# Patient Record
Sex: Male | Born: 1950 | Race: Black or African American | Hispanic: No | Smoking: Former smoker
Health system: Southern US, Community
[De-identification: ages and names within clinical notes are randomized; demographics above are authoritative.]

## PROBLEM LIST (undated history)

## (undated) DIAGNOSIS — I739 Peripheral vascular disease, unspecified: Secondary | ICD-10-CM

## (undated) DIAGNOSIS — F0390 Unspecified dementia without behavioral disturbance: Secondary | ICD-10-CM

## (undated) DIAGNOSIS — I8290 Acute embolism and thrombosis of unspecified vein: Secondary | ICD-10-CM

## (undated) DIAGNOSIS — K922 Gastrointestinal hemorrhage, unspecified: Secondary | ICD-10-CM

## (undated) DIAGNOSIS — I10 Essential (primary) hypertension: Secondary | ICD-10-CM

## (undated) HISTORY — PX: OTHER SURGICAL HISTORY: SHX169

---

## 2018-02-06 DIAGNOSIS — I8290 Acute embolism and thrombosis of unspecified vein: Secondary | ICD-10-CM

## 2018-02-06 HISTORY — DX: Acute embolism and thrombosis of unspecified vein: I82.90

## 2018-02-17 ENCOUNTER — Emergency Department (HOSPITAL_BASED_OUTPATIENT_CLINIC_OR_DEPARTMENT_OTHER): Payer: Medicare Other

## 2018-02-17 ENCOUNTER — Other Ambulatory Visit: Payer: Self-pay

## 2018-02-17 ENCOUNTER — Encounter (HOSPITAL_BASED_OUTPATIENT_CLINIC_OR_DEPARTMENT_OTHER): Payer: Self-pay | Admitting: *Deleted

## 2018-02-17 ENCOUNTER — Inpatient Hospital Stay (HOSPITAL_BASED_OUTPATIENT_CLINIC_OR_DEPARTMENT_OTHER)
Admission: EM | Admit: 2018-02-17 | Discharge: 2018-02-22 | DRG: 253 | Disposition: A | Discharge status: Home / Self Care | Payer: Medicare Other | Attending: Family Medicine | Admitting: Family Medicine

## 2018-02-17 DIAGNOSIS — R945 Abnormal results of liver function studies: Secondary | ICD-10-CM

## 2018-02-17 DIAGNOSIS — E872 Acidosis: Secondary | ICD-10-CM | POA: Diagnosis present

## 2018-02-17 DIAGNOSIS — I70244 Atherosclerosis of native arteries of left leg with ulceration of heel and midfoot: Principal | ICD-10-CM | POA: Diagnosis present

## 2018-02-17 DIAGNOSIS — E860 Dehydration: Secondary | ICD-10-CM | POA: Diagnosis present

## 2018-02-17 DIAGNOSIS — I70229 Atherosclerosis of native arteries of extremities with rest pain, unspecified extremity: Secondary | ICD-10-CM | POA: Diagnosis present

## 2018-02-17 DIAGNOSIS — Z7901 Long term (current) use of anticoagulants: Secondary | ICD-10-CM

## 2018-02-17 DIAGNOSIS — Z23 Encounter for immunization: Secondary | ICD-10-CM

## 2018-02-17 DIAGNOSIS — F17210 Nicotine dependence, cigarettes, uncomplicated: Secondary | ICD-10-CM | POA: Diagnosis present

## 2018-02-17 DIAGNOSIS — E876 Hypokalemia: Secondary | ICD-10-CM | POA: Diagnosis present

## 2018-02-17 DIAGNOSIS — M6282 Rhabdomyolysis: Secondary | ICD-10-CM | POA: Diagnosis present

## 2018-02-17 DIAGNOSIS — I10 Essential (primary) hypertension: Secondary | ICD-10-CM | POA: Diagnosis present

## 2018-02-17 DIAGNOSIS — F0390 Unspecified dementia without behavioral disturbance: Secondary | ICD-10-CM | POA: Diagnosis present

## 2018-02-17 DIAGNOSIS — I998 Other disorder of circulatory system: Secondary | ICD-10-CM | POA: Diagnosis present

## 2018-02-17 HISTORY — DX: Essential (primary) hypertension: I10

## 2018-02-17 HISTORY — DX: Unspecified dementia, unspecified severity, without behavioral disturbance, psychotic disturbance, mood disturbance, and anxiety: F03.90

## 2018-02-17 HISTORY — DX: Acute embolism and thrombosis of unspecified vein: I82.90

## 2018-02-17 LAB — CBC WITH DIFFERENTIAL/PLATELET
ABS IMMATURE GRANULOCYTES: 0.11 10*3/uL — AB (ref 0.00–0.07)
Basophils Absolute: 0.1 10*3/uL (ref 0.0–0.1)
Basophils Relative: 0 %
EOS PCT: 2 %
Eosinophils Absolute: 0.2 10*3/uL (ref 0.0–0.5)
HEMATOCRIT: 32.3 % — AB (ref 39.0–52.0)
HEMOGLOBIN: 10.6 g/dL — AB (ref 13.0–17.0)
Immature Granulocytes: 1 %
LYMPHS ABS: 1.7 10*3/uL (ref 0.7–4.0)
LYMPHS PCT: 14 %
MCH: 26.7 pg (ref 26.0–34.0)
MCHC: 32.8 g/dL (ref 30.0–36.0)
MCV: 81.4 fL (ref 80.0–100.0)
MONO ABS: 0.5 10*3/uL (ref 0.1–1.0)
Monocytes Relative: 4 %
NEUTROS ABS: 9.5 10*3/uL — AB (ref 1.7–7.7)
Neutrophils Relative %: 79 %
Platelets: 353 10*3/uL (ref 150–400)
RBC: 3.97 MIL/uL — ABNORMAL LOW (ref 4.22–5.81)
RDW: 15.8 % — ABNORMAL HIGH (ref 11.5–15.5)
WBC: 12.1 10*3/uL — ABNORMAL HIGH (ref 4.0–10.5)
nRBC: 0 % (ref 0.0–0.2)

## 2018-02-17 LAB — COMPREHENSIVE METABOLIC PANEL
ALK PHOS: 78 U/L (ref 38–126)
ALT: 52 U/L — AB (ref 0–44)
ANION GAP: 11 (ref 5–15)
AST: 66 U/L — ABNORMAL HIGH (ref 15–41)
Albumin: 2.9 g/dL — ABNORMAL LOW (ref 3.5–5.0)
BILIRUBIN TOTAL: 0.5 mg/dL (ref 0.3–1.2)
BUN: 28 mg/dL — ABNORMAL HIGH (ref 8–23)
CALCIUM: 8.6 mg/dL — AB (ref 8.9–10.3)
CO2: 27 mmol/L (ref 22–32)
CREATININE: 1.12 mg/dL (ref 0.61–1.24)
Chloride: 102 mmol/L (ref 98–111)
GFR calc Af Amer: >60 mL/min (ref >60)
GFR calc non Af Amer: >60 mL/min (ref >60)
Glucose, Bld: 128 mg/dL — ABNORMAL HIGH (ref 70–99)
Potassium: 2.7 mmol/L — CL (ref 3.5–5.1)
SODIUM: 140 mmol/L (ref 135–145)
TOTAL PROTEIN: 7.1 g/dL (ref 6.5–8.1)

## 2018-02-17 LAB — I-STAT CG4 LACTIC ACID, ED: LACTIC ACID, VENOUS: 2.77 mmol/L — AB (ref 0.5–1.9)

## 2018-02-17 LAB — URINALYSIS, ROUTINE W REFLEX MICROSCOPIC
Bilirubin Urine: NEGATIVE
GLUCOSE, UA: NEGATIVE mg/dL
HGB URINE DIPSTICK: NEGATIVE
Ketones, ur: NEGATIVE mg/dL
Leukocytes, UA: NEGATIVE
Nitrite: NEGATIVE
PH: 5.5 (ref 5.0–8.0)
Protein, ur: NEGATIVE mg/dL
SPECIFIC GRAVITY, URINE: 1.025 (ref 1.005–1.030)

## 2018-02-17 LAB — PROTIME-INR
INR: 1.33
Prothrombin Time: 16.3 seconds — ABNORMAL HIGH (ref 11.4–15.2)

## 2018-02-17 MED ORDER — POTASSIUM CHLORIDE CRYS ER 20 MEQ PO TBCR
20.00 | EXTENDED_RELEASE_TABLET | ORAL | Status: DC
Start: 2018-02-16 — End: 2018-02-17

## 2018-02-17 MED ORDER — AMLODIPINE BESYLATE 5 MG PO TABS
5.00 | ORAL_TABLET | ORAL | Status: DC
Start: 2018-02-16 — End: 2018-02-17

## 2018-02-17 MED ORDER — GENERIC EXTERNAL MEDICATION
2.00 | Status: DC
Start: 2018-02-15 — End: 2018-02-17

## 2018-02-17 MED ORDER — THERA-M PO TABS
1.00 | ORAL_TABLET | ORAL | Status: DC
Start: 2018-02-16 — End: 2018-02-17

## 2018-02-17 MED ORDER — ACETAMINOPHEN 325 MG PO TABS
650.00 | ORAL_TABLET | ORAL | Status: DC
Start: ? — End: 2018-02-17

## 2018-02-17 MED ORDER — SODIUM CHLORIDE 0.9 % IV SOLN: INTRAVENOUS | Status: DC

## 2018-02-17 MED ORDER — APIXABAN 5 MG PO TABS
5.00 | ORAL_TABLET | ORAL | Status: DC
Start: 2018-02-15 — End: 2018-02-17

## 2018-02-17 MED ORDER — MAGNESIUM OXIDE 400 MG PO TABS
400.00 | ORAL_TABLET | ORAL | Status: DC
Start: 2018-02-15 — End: 2018-02-17

## 2018-02-17 MED ORDER — METOPROLOL TARTRATE 25 MG PO TABS
25.00 | ORAL_TABLET | ORAL | Status: DC
Start: 2018-02-15 — End: 2018-02-17

## 2018-02-17 MED ORDER — SODIUM BICARBONATE 650 MG PO TABS
650.00 | ORAL_TABLET | ORAL | Status: DC
Start: 2018-02-15 — End: 2018-02-17

## 2018-02-17 MED ORDER — HYDROCODONE-ACETAMINOPHEN 5-325 MG PO TABS
1.00 | ORAL_TABLET | ORAL | Status: DC
Start: ? — End: 2018-02-17

## 2018-02-17 NOTE — ED Notes (Signed)
RN @bedside  and patient getting gowned, return shortly

## 2018-02-17 NOTE — ED Notes (Signed)
K level is 2.7. Dr. Deretha Emory aware

## 2018-02-17 NOTE — ED Notes (Signed)
ED Provider at bedside. 

## 2018-02-17 NOTE — ED Notes (Signed)
pts family member came to nurse first desk asking how many people were in front of them. RN explained to them that the number could change and patients being called back were based on acuity.

## 2018-02-17 NOTE — ED Triage Notes (Signed)
Pt. Was at [facility]. For Blood Clot in his groin per granddaughter and was blood thinners.  Pt. Also treated with antibiotics.  Pt. Here today with grand daughter because the wound on the R lower leg is not healing and she is upset that the Pt. Is now incont. Of urine.  Pt. Does live alone.  Pt. Reports he feels ok but reports he cant cont his urine.  Pt. Reports he eats some.  Has early dementia per family and is more confused per family.

## 2018-02-18 ENCOUNTER — Encounter (HOSPITAL_COMMUNITY): Payer: Self-pay | Admitting: Internal Medicine

## 2018-02-18 ENCOUNTER — Inpatient Hospital Stay (HOSPITAL_COMMUNITY): Payer: Medicare Other

## 2018-02-18 DIAGNOSIS — E872 Acidosis: Secondary | ICD-10-CM | POA: Diagnosis present

## 2018-02-18 DIAGNOSIS — I998 Other disorder of circulatory system: Secondary | ICD-10-CM | POA: Diagnosis not present

## 2018-02-18 DIAGNOSIS — Z23 Encounter for immunization: Secondary | ICD-10-CM | POA: Diagnosis not present

## 2018-02-18 DIAGNOSIS — E860 Dehydration: Secondary | ICD-10-CM | POA: Diagnosis present

## 2018-02-18 DIAGNOSIS — M6282 Rhabdomyolysis: Secondary | ICD-10-CM | POA: Diagnosis present

## 2018-02-18 DIAGNOSIS — E876 Hypokalemia: Secondary | ICD-10-CM

## 2018-02-18 DIAGNOSIS — I70229 Atherosclerosis of native arteries of extremities with rest pain, unspecified extremity: Secondary | ICD-10-CM | POA: Diagnosis present

## 2018-02-18 DIAGNOSIS — I739 Peripheral vascular disease, unspecified: Secondary | ICD-10-CM

## 2018-02-18 DIAGNOSIS — I70244 Atherosclerosis of native arteries of left leg with ulceration of heel and midfoot: Secondary | ICD-10-CM | POA: Diagnosis present

## 2018-02-18 DIAGNOSIS — F0390 Unspecified dementia without behavioral disturbance: Secondary | ICD-10-CM | POA: Diagnosis present

## 2018-02-18 DIAGNOSIS — R945 Abnormal results of liver function studies: Secondary | ICD-10-CM | POA: Diagnosis not present

## 2018-02-18 DIAGNOSIS — F17210 Nicotine dependence, cigarettes, uncomplicated: Secondary | ICD-10-CM | POA: Diagnosis present

## 2018-02-18 DIAGNOSIS — I10 Essential (primary) hypertension: Secondary | ICD-10-CM | POA: Diagnosis present

## 2018-02-18 DIAGNOSIS — Z01810 Encounter for preprocedural cardiovascular examination: Secondary | ICD-10-CM

## 2018-02-18 DIAGNOSIS — Z7901 Long term (current) use of anticoagulants: Secondary | ICD-10-CM | POA: Diagnosis not present

## 2018-02-18 LAB — APTT
APTT: 43 s — AB (ref 24–36)
APTT: 53 s — AB (ref 24–36)
APTT: 54 s — AB (ref 24–36)

## 2018-02-18 LAB — COMPREHENSIVE METABOLIC PANEL
ALT: 50 U/L — ABNORMAL HIGH (ref 0–44)
AST: 66 U/L — ABNORMAL HIGH (ref 15–41)
Albumin: 2.5 g/dL — ABNORMAL LOW (ref 3.5–5.0)
Alkaline Phosphatase: 73 U/L (ref 38–126)
Anion gap: 10 (ref 5–15)
BUN: 21 mg/dL (ref 8–23)
CHLORIDE: 104 mmol/L (ref 98–111)
CO2: 24 mmol/L (ref 22–32)
CREATININE: 0.98 mg/dL (ref 0.61–1.24)
Calcium: 8.5 mg/dL — ABNORMAL LOW (ref 8.9–10.3)
Glucose, Bld: 115 mg/dL — ABNORMAL HIGH (ref 70–99)
Potassium: 2.9 mmol/L — ABNORMAL LOW (ref 3.5–5.1)
SODIUM: 138 mmol/L (ref 135–145)
Total Bilirubin: 0.5 mg/dL (ref 0.3–1.2)
Total Protein: 6.3 g/dL — ABNORMAL LOW (ref 6.5–8.1)

## 2018-02-18 LAB — CBC
HEMATOCRIT: 30.8 % — AB (ref 39.0–52.0)
Hemoglobin: 10.1 g/dL — ABNORMAL LOW (ref 13.0–17.0)
MCH: 26.1 pg (ref 26.0–34.0)
MCHC: 32.8 g/dL (ref 30.0–36.0)
MCV: 79.6 fL — AB (ref 80.0–100.0)
Platelets: 401 10*3/uL — ABNORMAL HIGH (ref 150–400)
RBC: 3.87 MIL/uL — ABNORMAL LOW (ref 4.22–5.81)
RDW: 15.8 % — AB (ref 11.5–15.5)
WBC: 11.2 10*3/uL — AB (ref 4.0–10.5)
nRBC: 0 % (ref 0.0–0.2)

## 2018-02-18 LAB — CK: Total CK: 1047 U/L — ABNORMAL HIGH (ref 49–397)

## 2018-02-18 LAB — HEPARIN LEVEL (UNFRACTIONATED): Heparin Unfractionated: 0.96 IU/mL — ABNORMAL HIGH (ref 0.30–0.70)

## 2018-02-18 LAB — MAGNESIUM: MAGNESIUM: 2.3 mg/dL (ref 1.7–2.4)

## 2018-02-18 LAB — POTASSIUM: Potassium: 3.5 mmol/L (ref 3.5–5.1)

## 2018-02-18 LAB — HIV ANTIBODY (ROUTINE TESTING W REFLEX): HIV SCREEN 4TH GENERATION: NONREACTIVE

## 2018-02-18 MED ORDER — POTASSIUM CHLORIDE IN NACL 20-0.9 MEQ/L-% IV SOLN
INTRAVENOUS | Status: AC
  Administered 2018-02-18: 05:00:00 via INTRAVENOUS
  Filled 2018-02-18: qty 1000

## 2018-02-18 MED ORDER — HEPARIN (PORCINE) IN NACL 100-0.45 UNIT/ML-% IJ SOLN
1550.0000 [IU]/h | INTRAMUSCULAR | Status: DC
  Administered 2018-02-18: 1000 [IU]/h via INTRAVENOUS
  Administered 2018-02-19: 1450 [IU]/h via INTRAVENOUS
  Administered 2018-02-19: 1150 [IU]/h via INTRAVENOUS
  Filled 2018-02-18 (×3): qty 250

## 2018-02-18 MED ORDER — ACETAMINOPHEN 325 MG PO TABS
650.0000 mg | ORAL_TABLET | Freq: Four times a day (QID) | ORAL | Status: DC | PRN
  Administered 2018-02-19 – 2018-02-20 (×4): 650 mg via ORAL
  Filled 2018-02-18 (×4): qty 2

## 2018-02-18 MED ORDER — POTASSIUM CHLORIDE CRYS ER 20 MEQ PO TBCR
40.0000 meq | EXTENDED_RELEASE_TABLET | ORAL | Status: AC
  Administered 2018-02-18 (×2): 40 meq via ORAL
  Filled 2018-02-18 (×2): qty 2

## 2018-02-18 MED ORDER — AMLODIPINE-ATORVASTATIN 10-10 MG PO TABS: 1.0000 | ORAL_TABLET | Freq: Every day | ORAL | Status: DC

## 2018-02-18 MED ORDER — POTASSIUM CHLORIDE CRYS ER 20 MEQ PO TBCR
40.0000 meq | EXTENDED_RELEASE_TABLET | Freq: Once | ORAL | Status: AC
  Administered 2018-02-18: 40 meq via ORAL
  Filled 2018-02-18: qty 2

## 2018-02-18 MED ORDER — METOPROLOL TARTRATE 25 MG PO TABS
25.0000 mg | ORAL_TABLET | Freq: Two times a day (BID) | ORAL | Status: DC
  Administered 2018-02-18 – 2018-02-22 (×9): 25 mg via ORAL
  Filled 2018-02-18 (×9): qty 1

## 2018-02-18 MED ORDER — ATORVASTATIN CALCIUM 10 MG PO TABS: 10.0000 mg | ORAL_TABLET | Freq: Every day | ORAL | Status: DC

## 2018-02-18 MED ORDER — AMLODIPINE BESYLATE 10 MG PO TABS
10.0000 mg | ORAL_TABLET | Freq: Every day | ORAL | Status: DC
  Administered 2018-02-18 – 2018-02-22 (×5): 10 mg via ORAL
  Filled 2018-02-18 (×5): qty 1

## 2018-02-18 MED ORDER — ACETAMINOPHEN 650 MG RE SUPP: 650.0000 mg | Freq: Four times a day (QID) | RECTAL | Status: DC | PRN

## 2018-02-18 NOTE — ED Notes (Signed)
[facility] notified (Kim) - patient ready for transport 

## 2018-02-18 NOTE — Progress Notes (Signed)
Patient's daughter Reinhold Rickey requests phone call from Vascular physician on Monday 02/19/18. [phone]

## 2018-02-18 NOTE — Consult Note (Signed)
Hospital Consult    Reason for Consult:  Peripheral arterial disease Referring Physician:  Dr. Tana Coast MRN #:  [id number]  History of Present Illness: This is a 67 y.o. male with history of hypertension and peripheral arterial disease also has dementia recently was at [facility] was found to have no pulses in his bilateral feet.  Work-up was deferred secondary to febrile work-up.  Was taken to [facility] yesterday and now was transferred here for further evaluation.  History is limited by patient dementia.  He tells me that he is able to ambulate at the wound on the left foot does hurt.  Past Medical History:  Diagnosis Date  . Blood clot in vein 02/2018  . Dementia ([place])   . Hypertension     Surgical history: Unknown surgery on left leg  No Known Allergies  Prior to Admission medications   Medication Sig Start Date End Date Taking? Authorizing Provider  amlodipine-atorvastatin (CADUET) 10-10 MG tablet Take 1 tablet by mouth daily.   Yes [provider]  apixaban (ELIQUIS) KIT 5 kits by Does not apply route once.   Yes [provider]  metoprolol tartrate (LOPRESSOR) 25 MG tablet Take 25 mg by mouth 2 (two) times daily.   Yes [provider]    Social History   Socioeconomic History  . Marital status: Divorced    Spouse name: Not on file  . Number of children: Not on file  . Years of education: Not on file  . Highest education level: Not on file  Occupational History  . Not on file  Social Needs  . Financial resource strain: Not on file  . Food insecurity:    Worry: Not on file    Inability: Not on file  . Transportation needs:    Medical: Not on file    Non-medical: Not on file  Tobacco Use  . Smoking status: Current Every Day Smoker  . Smokeless tobacco: Never Used  Substance and Sexual Activity  . Alcohol use: Not Currently  . Drug use: Not on file  . Sexual activity: Not on file  Lifestyle  . Physical activity:   Days per week: Not on file    Minutes per session: Not on file  . Stress: Not on file  Relationships  . Social connections:    Talks on phone: Not on file    Gets together: Not on file    Attends religious service: Not on file    Active member of club or organization: Not on file    Attends meetings of clubs or organizations: Not on file    Relationship status: Not on file  . Intimate partner violence:    Fear of current or ex partner: Not on file    Emotionally abused: Not on file    Physically abused: Not on file    Forced sexual activity: Not on file  Other Topics Concern  . Not on file  Social History Narrative  . Not on file     No family history on file.  ROS: _0  Positive   _1  Negative   _2  All sytems reviewed and are negative  Cardiovascular: _3  chest pain/pressure _4  palpitations _5  SOB lying flat _6  DOE _7  pain in legs while walking _8  pain in legs at rest _9  pain in legs at night _10  non-healing ulcers _11  hx of DVT _12  swelling in legs  Pulmonary: _13  productive cough _14  asthma/wheezing _15  home O2  Neurologic: _16  weakness  in _0  arms _1  legs _2  numbness in _3  arms _4  legs _5  hx of CVA _6  mini stroke _7 difficulty speaking or slurred speech _8  temporary loss of vision in one eye _9  dizziness  Hematologic: _10  hx of cancer _11  bleeding problems _12  problems with blood clotting easily  Endocrine:   _13  diabetes _14  thyroid disease  GI _15  vomiting blood _16  blood in stool  GU: _17  CKD/renal failure _18  HD--_19  M/W/F or _20  T/T/S _21  burning with urination _22  blood in urine  Psychiatric: _23  confusion _24  depression  Musculoskeletal: _25  arthritis _26  joint pain  Integumentary: _27  rashes _28  ulcers  Constitutional: _29  fever _30  chills   Physical Examination  Vitals:   02/18/18 0359 02/18/18 0400  BP:  125/73  Pulse:  87  Resp:  20  Temp: 99.4 F (37.4 C)   SpO2:  96%   There is no height or weight on file to calculate  BMI.  General: Currently in no acute distress HENT: WNL, normocephalic Pulmonary: normal non-labored breathing Cardiac: Palpable femoral pulses bilaterally Abdomen: soft, NT/ND, no masses Extremities: There are ischemic appearing ulcers of his left ankle and foot, he can move the left foot states that he has sensation intact Musculoskeletal: no muscle wasting or atrophy  Neurologic: He is awake and alert and has an appropriate affect  CBC    Component Value Date/Time   WBC 11.2 (H) 02/18/2018 0504   RBC 3.87 (L) 02/18/2018 0504   HGB 10.1 (L) 02/18/2018 0504   HCT 30.8 (L) 02/18/2018 0504   PLT 401 (H) 02/18/2018 0504   MCV 79.6 (L) 02/18/2018 0504   MCH 26.1 02/18/2018 0504   MCHC 32.8 02/18/2018 0504   RDW 15.8 (H) 02/18/2018 0504   LYMPHSABS 1.7 02/17/2018 2215   MONOABS 0.5 02/17/2018 2215   EOSABS 0.2 02/17/2018 2215   BASOSABS 0.1 02/17/2018 2215    BMET    Component Value Date/Time   NA 138 02/18/2018 0504   K 2.9 (L) 02/18/2018 0504   CL 104 02/18/2018 0504   CO2 24 02/18/2018 0504   GLUCOSE 115 (H) 02/18/2018 0504   BUN 21 02/18/2018 0504   CREATININE 0.98 02/18/2018 0504   CALCIUM 8.5 (L) 02/18/2018 0504   GFRNONAA >60 02/18/2018 0504   GFRAA >60 02/18/2018 0504    COAGS: Lab Results  Component Value Date   INR 1.33 02/17/2018     Non-Invasive Vascular Imaging:   ABIs are ordered   ASSESSMENT/PLAN: This is a 67 y.o. male here with ulceration of his left foot that he cannot tell me how or when they occurred.  Was apparently undergoing work-up for this at [facility] but was subsequently discharged.  He is now transferred here for further evaluation.  I have ordered ABIs as a baseline.  He will need an angiogram with at least evaluation of his left lower extremity as I do not see any wounds on the right lower extremity.  He is at high risk for amputation during this hospitalization.  I will need to communicate this with the family when they are  available.  Tentative plan for peripheral vascular intervention on Tuesday.  Haylin Camilli C. Donzetta Matters, MD [facility] Office: [phone] Pager: [phone]

## 2018-02-18 NOTE — ED Notes (Signed)
Report given to [place], [facility]

## 2018-02-18 NOTE — ED Provider Notes (Addendum)
[place] EMERGENCY DEPARTMENT Provider Note   CSN: [id number] Arrival date & time: 02/17/18  1727     History   Chief Complaint Chief Complaint  Patient presents with  . Leg Swelling  . Urinary Incontinence    HPI Malik Stanton is a 67 y.o. male.  Patient brought in by granddaughter but but had to go home did leave phone number.  Attempt to call her on her phone but the voicemail and box was completely full.  Was able to read patient's admission at [facility] patient was admitted there October 5 through October 10.  He originally went to [facility] for problems with wounds to the left leg.  He was transferred from the emergency department to [facility] apparently had a fever at that time.  Clinically they were concerned appropriately about left leg ischemia.  Patient been having difficulty with the left leg for about 3 weeks.  Did have pain.  Due to the fever they could not do an angiogram to evaluate further.  They treated him for fever got is under control.  Patient was discharged and referred back to vascular surgery [place] region area for further evaluation of the ischemic leg.  Patient was put on Eliquis as a blood thinner.  And is apparently taking it.  Patient has dementia so he could not get much information out of the patient.  Patient denied any symptoms.  There was some concern about urinary incontinence as well.  No fevers.     Past Medical History:  Diagnosis Date  . Blood clot in vein 02/2018  . Hypertension     There are no active problems to display for this patient.       Home Medications    Prior to Admission medications   Medication Sig Start Date End Date Taking? Authorizing Provider  amlodipine-atorvastatin (CADUET) 10-10 MG tablet Take 1 tablet by mouth daily.   Yes [provider]  apixaban (ELIQUIS) KIT 5 kits by Does not apply route once.   Yes [provider]  metoprolol tartrate (LOPRESSOR) 25 MG tablet Take  25 mg by mouth 2 (two) times daily.   Yes [provider]    Family History No family history on file.  Social History Social History   Tobacco Use  . Smoking status: Not on file  Substance Use Topics  . Alcohol use: Not on file  . Drug use: Not on file     Allergies   Patient has no known allergies.   Review of Systems Review of Systems  Unable to perform ROS: Dementia     Physical Exam Updated Vital Signs BP 111/61   Pulse 88   Temp 98.4 F (36.9 C) (Oral)   Resp (!) 24   Wt 60.3 kg   SpO2 94%   Physical Exam  Constitutional: He appears well-developed and well-nourished. No distress.  HENT:  Head: Normocephalic and atraumatic.  Mucous membranes dry.  Eyes: Pupils are equal, round, and reactive to light. EOM are normal.  Neck: Neck supple.  Cardiovascular: Normal rate, regular rhythm and normal heart sounds.  Pulmonary/Chest: Effort normal and breath sounds normal. No respiratory distress.  Abdominal: Soft. Bowel sounds are normal. There is no tenderness.  Musculoskeletal: Normal range of motion.  Cap refill to right toes about 2 seconds.  Cap refill to the left toes greater than 2 seconds.  No palpable dorsalis pedis or posterior tibial pulses to the left foot.  Does have palpable left femoral  pulse in the groin.  Left leg anterior shin has 2 scraped wounds without evidence of infection lateral part of the leg has some blistering and a little bit of bluing color to that down into the lateral aspect of the foot.  Neurological: He is alert. No cranial nerve deficit or sensory deficit. He exhibits normal muscle tone. Coordination normal.  Skin: Skin is warm.  Nursing note and vitals reviewed.    ED Treatments / Results  Labs (all labs ordered are listed, but only abnormal results are displayed) Labs Reviewed  CBC WITH DIFFERENTIAL/PLATELET - Abnormal; Notable for the following components:      Result Value   WBC 12.1 (*)    RBC 3.97 (*)     Hemoglobin 10.6 (*)    HCT 32.3 (*)    RDW 15.8 (*)    Neutro Abs 9.5 (*)    Abs Immature Granulocytes 0.11 (*)    All other components within normal limits  COMPREHENSIVE METABOLIC PANEL - Abnormal; Notable for the following components:   Potassium 2.7 (*)    Glucose, Bld 128 (*)    BUN 28 (*)    Calcium 8.6 (*)    Albumin 2.9 (*)    AST 66 (*)    ALT 52 (*)    All other components within normal limits  PROTIME-INR - Abnormal; Notable for the following components:   Prothrombin Time 16.3 (*)    All other components within normal limits  I-STAT CG4 LACTIC ACID, ED - Abnormal; Notable for the following components:   Lactic Acid, Venous 2.77 (*)    All other components within normal limits  CULTURE, BLOOD (ROUTINE X 2)  CULTURE, BLOOD (ROUTINE X 2)  URINALYSIS, ROUTINE W REFLEX MICROSCOPIC    EKG EKG Interpretation  Date/Time:  Saturday February 17 2018 21:59:07 EDT Ventricular Rate:  89 PR Interval:    QRS Duration: 104 QT Interval:  443 QTC Calculation: 540 R Axis:   2 Text Interpretation:  Atrial flutter with varied AV block, Anterior infarct, old Prolonged QT interval Baseline wander in lead(s) II III aVR aVF Interpretation limited secondary to artifact No previous ECGs available Confirmed by Fredia Sorrow [phone]) on 02/17/2018 10:05:08 PM   Radiology Dg Chest 2 View  Result Date: 02/17/2018 CLINICAL DATA:  Wounds on left foot and left lower leg. Urinary incontinence. Chronic cough. Smoker. Hypertension. EXAM: CHEST - 2 VIEW COMPARISON:  10/15/2016 FINDINGS: Emphysematous changes in the lungs with scattered central interstitial pattern suggesting chronic bronchitis. Heart size and pulmonary vascularity are normal. No airspace disease or consolidation in the lungs. No blunting of costophrenic angles. No pneumothorax. Calcification of the aorta. Mediastinal contours appear intact. Degenerative changes in the spine. IMPRESSION: Emphysematous and chronic bronchitic changes  in the lungs. No evidence of active pulmonary disease. Electronically Signed   By: Lucienne Capers M.D.   On: 02/17/2018 23:16   Dg Tibia/fibula Left  Result Date: 02/17/2018 CLINICAL DATA:  Wounds on left foot and left lower leg. Urinary incontinence. Chronic cough. Smoker. Hypertension. EXAM: LEFT TIBIA AND FIBULA - 2 VIEW COMPARISON:  None. FINDINGS: Degenerative changes in the left knee with medial and lateral compartment narrowing, small osteophyte formation, and cartilaginous calcifications. No evidence of acute fracture or dislocation involving the tibia or fibula. Cortical thickening along the posterior distal tibial metaphysis probably representing either old fracture deformity or possibly an exostosis. Ossification of the distal tibiofibular ligament. Vascular calcifications. IMPRESSION: Degenerative changes in the left knee. Old appearing deformity of the  distal tibial metaphysis. No acute bony abnormalities. Electronically Signed   By: Lucienne Capers M.D.   On: 02/17/2018 23:18   Dg Foot Complete Left  Result Date: 02/17/2018 CLINICAL DATA:  Wounds on left foot and left lower leg. Urinary incontinence. Chronic cough. Smoker. Hypertension. EXAM: LEFT FOOT - COMPLETE 3+ VIEW COMPARISON:  None. FINDINGS: Degenerative changes in the interphalangeal joints and first metatarsal-phalangeal joint. Mild degenerative changes in the intertarsal joints. No evidence of acute fracture or dislocation of the left foot. No focal bone lesion or bone destruction. Soft tissues are unremarkable. IMPRESSION: Degenerative changes in the left foot. No acute bony abnormalities. Electronically Signed   By: Lucienne Capers M.D.   On: 02/17/2018 23:19    Procedures Procedures (including critical care time)  Medications Ordered in ED Medications  0.9 %  sodium chloride infusion (has no administration in time range)  potassium chloride SA (K-DUR,KLOR-CON) CR tablet 40 mEq (has no administration in time range)    potassium chloride SA (K-DUR,KLOR-CON) CR tablet 40 mEq (40 mEq Oral Given 02/18/18 0045)     Initial Impression / Assessment and Plan / ED Course  I have reviewed the triage vital signs and the nursing notes.  Pertinent labs & imaging results that were available during my care of the patient were reviewed by me and considered in my medical decision making (see chart for details).    Eventually granddaughter called back.  Was able to talk to her.  We reviewed the admission at [facility].  In the follow-up with vascular surgery in the [facility] regional area.  She no longer wants to go to be involved with the [facility] system and wanted patient moved into the [facility] health system.  Based on this I spoke to Dr. Donzetta Matters from vascular surgery.  Does appear clinically that there is an ischemic leg it appears is been getting worse over the last 3 weeks.  Patient also has hypokalemia.  Has been given 40 milk limits of potassium orally x2.  Potassium was 2.7 has some mild renal insufficiency.  Mild leukocytosis.  Otherwise patient was nontoxic no acute distress.  Lactic acid was elevated blood cultures were done but clinically patient was not septic.  X-rays of the left leg and foot showed no evidence of any bony involvement or any soft tissue gas.  We will discuss with unassigned admission folks which probably will be the triad hospitalist for medical admission and vascular surgery will consult.  Patient will be admitted by hospitalist service at [facility].  Dr. Tamala Julian.  [facility] will transfer patient to bed. Final Clinical Impressions(s) / ED Diagnoses   Final diagnoses:  Hypokalemia  Ischemic leg    ED Discharge Orders    None       Fredia Sorrow, MD 02/18/18 7445    Fredia Sorrow, MD 02/18/18 [phone]

## 2018-02-18 NOTE — Progress Notes (Signed)
ANTICOAGULATION CONSULT NOTE - Initial Consult  Pharmacy Consult for Heparin Indication: limb ischemia/thrombus  No Known Allergies  Patient Measurements: Weight: 128 lb 1.4 oz (58.1 kg)  Vital Signs: Temp: 99.4 F (37.4 C) (10/13 0359) Temp Source: Oral (10/13 0359) BP: 125/73 (10/13 0400) Pulse Rate: 87 (10/13 0400)  Labs: Recent Labs    02/17/18 2215  HGB 10.6*  HCT 32.3*  PLT 353  LABPROT 16.3*  INR 1.33  CREATININE 1.12    CrCl cannot be calculated (Unknown ideal weight.).   Medical History: Past Medical History:  Diagnosis Date  . Blood clot in vein 02/2018  . Dementia ([place])   . Hypertension     Medications:  Medications Prior to Admission  Medication Sig Dispense Refill Last Dose  . amlodipine-atorvastatin (CADUET) 10-10 MG tablet Take 1 tablet by mouth daily.     [name] apixaban (ELIQUIS) KIT 5 kits by Does not apply route once.   02/17/2018 at Unknown time  . metoprolol tartrate (LOPRESSOR) 25 MG tablet Take 25 mg by mouth 2 (two) times daily.       Assessment: 67 y.o. male with L EIA thrombus, Eliquis on hold, for heparin  Goal of Therapy:  APTT 66-102 Heparin level 0.3-0.7 units/ml Monitor platelets by anticoagulation protocol: Yes   Plan:  Start heparin 1000 units/hr APTT in 8 hours  Joah Patlan, Bronson Curb 02/18/2018,5:30 AM

## 2018-02-18 NOTE — Consult Note (Signed)
WOC Nurse wound consult note Reason for Consult: suggestions for care of the left LE medial and lateral wounds, full thickness in the presence of critical limb ischemia (L) and severe ischemia (r). Vascular Surgery has been consulted. Wound type: PAD Pressure Injury POA: NA Measurement: Medial: 9cm x 7cm area of dry nonviable tissue, no drainage. Lateral: 18cm x 5cm area with two wounds, the most distal is due to medical adhesive related skin injury (MARSI) that occurred at home. The proximmal wound is dry, nonviable tissue; the area of MARSI is pink, moist but without exudate. Wound bed: As described above Drainage (amount, consistency, odor) AS described above Periwound: dry, intact, cool Dressing procedure/placement/frequency: I have provided Nursing with guidance for the care of the LLE wounds.  As previously noted, VVS has been consulted and saw this am, prior to ABIs. Their orders will supercede mine in the event an operative procedure is not performed.  WOC nursing team will not follow, but will remain available to this patient, the nursing and medical teams.  Please re-consult if needed. Thanks, Ladona Mow, MSN, RN, GNP, [place]  Pager# [phone]

## 2018-02-18 NOTE — H&P (Addendum)
TRH H&P   Patient Demographics:    Malik Stanton, is a 67 y.o. male  MRN: [id number]   DOB - [birth year]  Admit Date - 02/17/2018  Outpatient Primary MD for the patient is Patient, No Pcp Per  Referring MD/NP/PA: Aundria Rud  Outpatient Specialists:     Patient coming from: home  Chief Complaint  Patient presents with  . Leg Swelling  . Urinary Incontinence      HPI:    Malik Stanton  is a 67 y.o. 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 Pt was transferred from [facility] to [facility] on 10/5, and apparently unable to have angiogram due to fever 102.  Pt was discharged on Eliquis. He apparently now presented to [place] ER for swelling of the left lower extremity and pain   In Ed,  T 99.4 P 79 -103  Bp 125/70  Pox 96% on RA  Xray left foot IMPRESSION: Degenerative changes in the left foot. No acute bony abnormalities.  Xray Tib/ fib IMPRESSION: Degenerative changes in the left knee. Old appearing deformity of the distal tibial metaphysis. No acute bony abnormalities.  CXR IMPRESSION: Emphysematous and chronic bronchitic changes in the lungs. No evidence of active pulmonary disease.  Wbc 12.1, Hgb 10.6, Plt 353 Na 140, K 2.7 Bun 28, Creatinine 1.12 Glucose 128 Alb 2.9 Ast 66, Alt 52, Alk phos 78, T. Bili 0.5  Magnesium 2.3 Lactic acid 2.77 Blood culture x2 pending  Pt will be admitted for critical left lower limb ischemia.        Review of systems:    In addition to the HPI above,  No Fever-chills, No Headache, No changes with Vision or hearing, No problems swallowing food or Liquids, No Chest pain, Cough or  Shortness of Breath, No Abdominal pain, No Nausea or Vommitting, Bowel movements are regular, No Blood in stool or Urine, No dysuria, + skin ulcers No new joints pains-aches,  No new weakness, tingling, numbness in any extremity, No recent weight gain or loss, No polyuria, polydypsia or polyphagia, No significant Mental Stressors.  A full 10 point Review of Systems was done, except as stated above, all other Review of Systems were negative.   With Past History of the following :    Past Medical History:  Diagnosis Date  . Blood clot in vein 02/2018  . Hypertension       History reviewed. No pertinent surgical history.    Social History:     Social History   Tobacco Use  . Smoking status: Current Every Day Smoker  . Smokeless tobacco: Never Used  Substance Use Topics  . Alcohol use: Not Currently  Lives - at home  Mobility - walks by self   Family History :    No family history on file. Unable to obtain due to dementia   Home Medications:   Prior to Admission medications   Medication Sig Start Date End Date Taking? Authorizing Provider  amlodipine-atorvastatin (CADUET) 10-10 MG tablet Take 1 tablet by mouth daily.   Yes [provider]  apixaban (ELIQUIS) KIT 5 kits by Does not apply route once.   Yes [provider]  metoprolol tartrate (LOPRESSOR) 25 MG tablet Take 25 mg by mouth 2 (two) times daily.   Yes [provider]     Allergies:    No Known Allergies   Physical Exam:   Vitals  Blood pressure 125/70, pulse 79, temperature 99.4 F (37.4 C), temperature source Oral, resp. rate (!) 24, weight 58.1 kg, SpO2 96 %.   1. General  lying in bed in NAD,    2. Normal affect and insight, Not Suicidal or Homicidal, Awake Alert, Oriented X 2.  3. No F.N deficits, ALL C.Nerves Intact, Strength 5/5 all 4 extremities, Sensation intact all 4 extremities, Plantars down going.  4. Ears and Eyes appear Normal, Conjunctivae  clear, PERRLA. Moist Oral Mucosa.  5. Supple Neck, No JVD, No cervical lymphadenopathy appriciated, No Carotid Bruits.  6. Symmetrical Chest wall movement, Good air movement bilaterally, CTAB.  7. RRR, No Gallops, Rubs or Murmurs, No Parasternal Heave.  8. Positive Bowel Sounds, Abdomen Soft, No tenderness, No organomegaly appriciated,No rebound -guarding or rigidity.  9.  Skin ulcer on the the dorsum of left foot about 3cm oval stage2, and also skin ulcer on the anterior shin as well as lateral aspect of the distal lower ext No redness, no purulent drainage , unable to palpate dp pulse   10. Good muscle tone,  joints appear normal , no effusions, Normal ROM.  11. No Palpable Lymph Nodes in Neck or Axillae    Data Review:    CBC Recent Labs  Lab 02/17/18 2215  WBC 12.1*  HGB 10.6*  HCT 32.3*  PLT 353  MCV 81.4  MCH 26.7  MCHC 32.8  RDW 15.8*  LYMPHSABS 1.7  MONOABS 0.5  EOSABS 0.2  BASOSABS 0.1   ------------------------------------------------------------------------------------------------------------------  Chemistries  Recent Labs  Lab 02/17/18 2215  NA 140  K 2.7*  CL 102  CO2 27  GLUCOSE 128*  BUN 28*  CREATININE 1.12  CALCIUM 8.6*  MG 2.3  AST 66*  ALT 52*  ALKPHOS 78  BILITOT 0.5   ------------------------------------------------------------------------------------------------------------------ CrCl cannot be calculated (Unknown ideal weight.). ------------------------------------------------------------------------------------------------------------------ No results for input(s): TSH, T4TOTAL, T3FREE, THYROIDAB in the last 72 hours.  Invalid input(s): FREET3  Coagulation profile Recent Labs  Lab 02/17/18 2215  INR 1.33   ------------------------------------------------------------------------------------------------------------------- No results for input(s): DDIMER in the last 72  hours. -------------------------------------------------------------------------------------------------------------------  Cardiac Enzymes No results for input(s): CKMB, TROPONINI, MYOGLOBIN in the last 168 hours.  Invalid input(s): CK ------------------------------------------------------------------------------------------------------------------ No results found for: BNP   ---------------------------------------------------------------------------------------------------------------  Urinalysis    Component Value Date/Time   COLORURINE YELLOW 02/17/2018 2051   APPEARANCEUR CLEAR 02/17/2018 2051   LABSPEC 1.025 02/17/2018 2051   PHURINE 5.5 02/17/2018 2051   [place] 02/17/2018 2051   [place] NEGATIVE 02/17/2018 2051   [place] NEGATIVE 02/17/2018 2051   [place] NEGATIVE 02/17/2018 2051   PROTEINUR NEGATIVE 02/17/2018 2051   NITRITE NEGATIVE 02/17/2018 2051   LEUKOCYTESUR NEGATIVE 02/17/2018 2051    ----------------------------------------------------------------------------------------------------------------   Imaging Results:  Dg Chest 2 View  Result Date: 02/17/2018 CLINICAL DATA:  Wounds on left foot and left lower leg. Urinary incontinence. Chronic cough. Smoker. Hypertension. EXAM: CHEST - 2 VIEW COMPARISON:  10/15/2016 FINDINGS: Emphysematous changes in the lungs with scattered central interstitial pattern suggesting chronic bronchitis. Heart size and pulmonary vascularity are normal. No airspace disease or consolidation in the lungs. No blunting of costophrenic angles. No pneumothorax. Calcification of the aorta. Mediastinal contours appear intact. Degenerative changes in the spine. IMPRESSION: Emphysematous and chronic bronchitic changes in the lungs. No evidence of active pulmonary disease. Electronically Signed   By: Lucienne Capers M.D.   On: 02/17/2018 23:16   Dg Tibia/fibula Left  Result Date: 02/17/2018 CLINICAL DATA:  Wounds on left foot and  left lower leg. Urinary incontinence. Chronic cough. Smoker. Hypertension. EXAM: LEFT TIBIA AND FIBULA - 2 VIEW COMPARISON:  None. FINDINGS: Degenerative changes in the left knee with medial and lateral compartment narrowing, small osteophyte formation, and cartilaginous calcifications. No evidence of acute fracture or dislocation involving the tibia or fibula. Cortical thickening along the posterior distal tibial metaphysis probably representing either old fracture deformity or possibly an exostosis. Ossification of the distal tibiofibular ligament. Vascular calcifications. IMPRESSION: Degenerative changes in the left knee. Old appearing deformity of the distal tibial metaphysis. No acute bony abnormalities. Electronically Signed   By: Lucienne Capers M.D.   On: 02/17/2018 23:18   Dg Foot Complete Left  Result Date: 02/17/2018 CLINICAL DATA:  Wounds on left foot and left lower leg. Urinary incontinence. Chronic cough. Smoker. Hypertension. EXAM: LEFT FOOT - COMPLETE 3+ VIEW COMPARISON:  None. FINDINGS: Degenerative changes in the interphalangeal joints and first metatarsal-phalangeal joint. Mild degenerative changes in the intertarsal joints. No evidence of acute fracture or dislocation of the left foot. No focal bone lesion or bone destruction. Soft tissues are unremarkable. IMPRESSION: Degenerative changes in the left foot. No acute bony abnormalities. Electronically Signed   By: Lucienne Capers M.D.   On: 02/17/2018 23:19       Assessment & Plan:    Principal Problem:   Critical lower limb ischemia Active Problems:   Hypokalemia    Critical left lower limb ischemia DC eliquis Heparin iv pharmacy to dose NPO Vascular surgery consulted by ED, appreciate input Check cbc in am  Skin ulcers Likely due to PAD Wound care consult  Hypokalemia Replete Check cmp in am  PAD Cont Lipitor  Hypertension Cont Amlodipine Cont Metoprolol  Abnormal liver function Check acute hepatitis  panel Check RUQ ultrasound Check cmp in am Consider discontinue Lipitor if worsening  Dementia? Outpatient follow up please      DVT Prophylaxis Heparin - AM Labs Ordered, also please review Full Orders  Family Communication: Admission, patients condition and plan of care including tests being ordered have been discussed with the patient  who indicate understanding and agree with the plan and Code Status.  Code Status  FULL CODE  Likely DC to  home  Condition GUARDED    Consults called: vascular surgery by ED,   Admission status: inpatient, pt will require iv heparin and possibly revascularization for critical limb ischemia. , this will more likely take 2+ nites stay as well as repletion of potassium and thus he qualifies for inpatient status  Time spent in minutes : 70   Jani Gravel M.D on 02/18/2018 at 4:33 AM  Between 7am to 7pm - Pager - [phone]  . After 7pm go to [email or web address] - password [email or web address]  [facility] - Office  [phone]

## 2018-02-18 NOTE — Plan of Care (Signed)
Transfer from [facility] discussed with Dr. Rogene Houston.  Malik Stanton is a 67 year old male with pmh PAD, HTN, AAA, mural thrombus, on Eliquis, and dementia; who presented with progressively worsening leg swelling and urinary incontinence.  Recently hospitalized Oct 5-10 at [facility] where patient was treated for some patient and found to have ischemic left leg.  Does not appear that any procedures were performed.  Patient met SIRS criteria and blood cultures were performed, but no antibiotics given.  Admission WBC 12.1, hemoglobin 10.6, potassium 2.7.  Dr. Donzetta Matters of surgery was consulted and they will see the patient.  Patient was given 80 mEq of potassium chloride p.o. and a normal saline IV fluids at 100 mL/h.  Transfer to a telemetry bed as inpatient.

## 2018-02-18 NOTE — Progress Notes (Signed)
ANTICOAGULATION CONSULT NOTE   Pharmacy Consult for Heparin Indication: limb ischemia/thrombus  No Known Allergies  Patient Measurements: Weight: 128 lb 1.4 oz (58.1 kg)  Vital Signs: Temp: 99.7 F (37.6 C) (10/13 1940) Temp Source: Oral (10/13 1940) BP: 140/80 (10/13 1940) Pulse Rate: 89 (10/13 1940)  Labs: Recent Labs    02/17/18 2215 02/18/18 0504 02/18/18 1109 02/18/18 2231  HGB 10.6* 10.1*  --   --   HCT 32.3* 30.8*  --   --   PLT 353 401*  --   --   APTT  --  43* 53* 54*  LABPROT 16.3*  --   --   --   INR 1.33  --   --   --   HEPARINUNFRC  --  0.96*  --   --   CREATININE 1.12 0.98  --   --   CKTOTAL  --   --  1,047*  --      Assessment: 67 y.o. male with leg ischemia, on Eliquis PTA (last dose 10/12) on hold, and transitioned to heparin; Angiogram tentatively planned for 10/15  aPTT subtherapeutic is low at 54, however, RN states heparin infusion was off for an unknown amount of time  Goal of Therapy:  APTT 66-102 secs Heparin level 0.3-0.7 units/ml Monitor platelets by anticoagulation protocol: Yes   Plan:  Cont heparin drip at current rate given unknown off time Re-check aPTT with AM labs Daily heparin level, CBC, and aPTT  Abran Duke, PharmD, BCPS Clinical Pharmacist Phone: [phone]

## 2018-02-18 NOTE — ED Notes (Signed)
Attempted to call family twice without an answer. Unable to leave a voice message

## 2018-02-18 NOTE — Progress Notes (Signed)
[facility]                                                                              Patient Demographics  Malik Stanton, is a 67 y.o. male, DOB - 12-Apr-1951, WUJ:[id number]  Admit date - 02/17/2018   Admitting Physician Clydie Braun, MD  Outpatient Primary MD for the patient is Patient, No Pcp Per  Outpatient specialists:   LOS - 0  days   Medical records reviewed and are as summarized below:    Chief Complaint  Patient presents with  . Leg Swelling  . Urinary Incontinence       Brief summary   Malik Stanton  is a 66 y.o. 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 Pt was transferred from [facility] to [place] on 10/5, and apparently unable to have angiogram due to fever of 102.  Pt was discharged on Eliquis and referred back to vascular surgery at [facility] Patient was brought by granddaughter as the wounds were not healing.  Vascular surgery was consulted due to concern for critical limb ischemia.  Assessment & Plan    Principal Problem:   Critical left lower limb ischemia -Eliquis was placed on hold and patient was placed on IV heparin drip -ABIs done today showed left critical left limb ischemia (0.13) and right severe lower extremity arterial disease -Vascular surgery has been consulted, continue n.p.o., await further recommendations   Active Problems: Severe hypokalemia K 2.7 at the time of admission, only improved to 2.9 Replaced orally 40 mEq p.o. x2 doses, will recheck potassium later today Magnesium within normal limits  Abnormal liver function test -Reviewed right upper quadrant ultrasound which shows possible gallbladder dysfunction, no pericholecystic  fluid or other secondary signs of acute cholecystitis.  Recommended HIDA scan -Currently patient does not complaining of any abdominal pain nausea vomiting or any signs of acute cholecystitis -Will await vascular surgery management for critical left limb ischemia which is more of a priority -Hold statin -If patient spikes any fevers, will place on IV Zosyn  Dehydration with lactic acidosis -Continue IV fluid hydration  Severe PAD with left lower extremity ulcers -Wound care consulted,  continue wound care as recommended -Continue heparin drip, -Hold Lipitor due to abnormal LFTs  Hypertension -BP currently stable, continue metoprolol, amlodipine  Dementia:  -Unable to provide much history, may need higher level of care at discharge   Code Status: FULL CODE  DVT Prophylaxis: Heparin drip Family Communication: Discussed in detail with the patient, all imaging results, lab results explained to the patient    Disposition Plan: Awaiting for vascular surgery recommendations  Time Spent in minutes   25 minutes  Procedures:  ABIs Right upper quadrant ultrasound  Consultants:   Vascular surgery  Antimicrobials:   None   Medications  Scheduled Meds: . amLODipine  10 mg Oral Daily  . atorvastatin  10 mg Oral q1800  .  metoprolol tartrate  25 mg Oral BID   Continuous Infusions: . 0.9 % NaCl with KCl 20 mEq / L 50 mL/hr at 02/18/18 0446  . heparin 1,000 Units/hr (02/18/18 0652)   PRN Meds:.acetaminophen **OR** acetaminophen   Antibiotics   Anti-infectives (From admission, onward)   None        Subjective:   Malik Stanton was seen and examined today.  Feeling hungry, otherwise denies any specific complaints.  No chest pain, shortness of breath, abdominal pain, nausea vomiting.  Denies any significant pain in the left lower extremity.  Objective:   Vitals:   02/18/18 0249 02/18/18 0254 02/18/18 0359 02/18/18 0400  BP: 125/70   125/73  Pulse:  79  87  Resp:     20  Temp:   99.4 F (37.4 C)   TempSrc:   Oral   SpO2:  96%  96%  Weight:   58.1 kg    No intake or output data in the 24 hours ending 02/18/18 1048   Wt Readings from Last 3 Encounters:  02/18/18 58.1 kg     Exam  General: Alert and oriented x self and place, poor historian  Eyes:   HEENT:  Atraumatic, normocephalic  Cardiovascular: S1 S2 auscultated,  Regular rate and rhythm.  Respiratory: Clear to auscultation bilaterally, no wheezing, rales or rhonchi  Gastrointestinal: Soft, nontender, nondistended, + bowel sounds  Ext: no pedal edema bilaterally  Neuro: moving all 4 extremities  Musculoskeletal: No digital cyanosis, clubbing  Skin: Left lower extremity wounds medial 9x 7 cm, on lateral; large wound, dry, no drainage  Psych: dementia   Data Reviewed:  I have personally reviewed following labs and imaging studies  Micro Results No results found for this or any previous visit (from the past 240 hour(s)).  Radiology Reports Dg Chest 2 View  Result Date: 02/17/2018 CLINICAL DATA:  Wounds on left foot and left lower leg. Urinary incontinence. Chronic cough. Smoker. Hypertension. EXAM: CHEST - 2 VIEW COMPARISON:  10/15/2016 FINDINGS: Emphysematous changes in the lungs with scattered central interstitial pattern suggesting chronic bronchitis. Heart size and pulmonary vascularity are normal. No airspace disease or consolidation in the lungs. No blunting of costophrenic angles. No pneumothorax. Calcification of the aorta. Mediastinal contours appear intact. Degenerative changes in the spine. IMPRESSION: Emphysematous and chronic bronchitic changes in the lungs. No evidence of active pulmonary disease. Electronically Signed   By: Burman Nieves M.D.   On: 02/17/2018 23:16   Dg Tibia/fibula Left  Result Date: 02/17/2018 CLINICAL DATA:  Wounds on left foot and left lower leg. Urinary incontinence. Chronic cough. Smoker. Hypertension. EXAM: LEFT TIBIA AND FIBULA - 2  VIEW COMPARISON:  None. FINDINGS: Degenerative changes in the left knee with medial and lateral compartment narrowing, small osteophyte formation, and cartilaginous calcifications. No evidence of acute fracture or dislocation involving the tibia or fibula. Cortical thickening along the posterior distal tibial metaphysis probably representing either old fracture deformity or possibly an exostosis. Ossification of the distal tibiofibular ligament. Vascular calcifications. IMPRESSION: Degenerative changes in the left knee. Old appearing deformity of the distal tibial metaphysis. No acute bony abnormalities. Electronically Signed   By: Burman Nieves M.D.   On: 02/17/2018 23:18   Dg Foot Complete Left  Result Date: 02/17/2018 CLINICAL DATA:  Wounds on left foot and left lower leg. Urinary incontinence. Chronic cough. Smoker. Hypertension. EXAM: LEFT FOOT - COMPLETE 3+ VIEW COMPARISON:  None. FINDINGS: Degenerative changes in the interphalangeal joints and first metatarsal-phalangeal joint. Mild degenerative changes  in the intertarsal joints. No evidence of acute fracture or dislocation of the left foot. No focal bone lesion or bone destruction. Soft tissues are unremarkable. IMPRESSION: Degenerative changes in the left foot. No acute bony abnormalities. Electronically Signed   By: Burman Nieves M.D.   On: 02/17/2018 23:19   US Abdomen Limited Ruq  Result Date: 02/18/2018 CLINICAL DATA:  Abnormal LFTs. EXAM: ULTRASOUND ABDOMEN LIMITED RIGHT UPPER QUADRANT COMPARISON:  None. FINDINGS: Gallbladder: Gallbladder is contracted. Gallbladder walls appear thickened, but this is likely accentuated by the gallbladder contraction. Small stones versus polyps at the gallbladder fundus. Common bile duct: Diameter: Within normal limits of 5-6 mm. Liver: No focal lesion identified. Within normal limits in parenchymal echogenicity. Portal vein is patent on color Doppler imaging with normal direction of blood flow towards  the liver. IMPRESSION: 1. Gallbladder is contracted, despite being NPO for 6 hours. This may indicate gallbladder dysfunction. Gallbladder walls are thickened, but this is likely accentuated by the gallbladder contraction. No pericholecystic fluid, sonographic Murphy's sign or other secondary signs of acute cholecystitis. Gallbladder wall thickening is nonspecific with differential considerations of acute or chronic cholecystitis, hypoproteinemia, CHF and reactive thickening secondary to adjacent liver disease. Would consider nuclear medicine HIDA scan to ensure normal gallbladder function and exclude cholecystitis. 2. Small polyps versus small stones at the gallbladder fundus. 3. Liver appears normal. 4. No bile duct dilatation. Electronically Signed   By: Bary Richard M.D.   On: 02/18/2018 10:02    Lab Data:  CBC: Recent Labs  Lab 02/17/18 2215 02/18/18 0504  WBC 12.1* 11.2*  NEUTROABS 9.5*  --   HGB 10.6* 10.1*  HCT 32.3* 30.8*  MCV 81.4 79.6*  PLT 353 401*   Basic Metabolic Panel: Recent Labs  Lab 02/17/18 2215 02/18/18 0504  NA 140 138  K 2.7* 2.9*  CL 102 104  CO2 27 24  GLUCOSE 128* 115*  BUN 28* 21  CREATININE 1.12 0.98  CALCIUM 8.6* 8.5*  MG 2.3  --    GFR: CrCl cannot be calculated (Unknown ideal weight.). Liver Function Tests: Recent Labs  Lab 02/17/18 2215 02/18/18 0504  AST 66* 66*  ALT 52* 50*  ALKPHOS 78 73  BILITOT 0.5 0.5  PROT 7.1 6.3*  ALBUMIN 2.9* 2.5*   No results for input(s): LIPASE, AMYLASE in the last 168 hours. No results for input(s): AMMONIA in the last 168 hours. Coagulation Profile: Recent Labs  Lab 02/17/18 2215  INR 1.33   Cardiac Enzymes: No results for input(s): CKTOTAL, CKMB, CKMBINDEX, TROPONINI in the last 168 hours. BNP (last 3 results) No results for input(s): PROBNP in the last 8760 hours. HbA1C: No results for input(s): HGBA1C in the last 72 hours. CBG: No results for input(s): GLUCAP in the last 168 hours. Lipid  Profile: No results for input(s): CHOL, HDL, LDLCALC, TRIG, CHOLHDL, LDLDIRECT in the last 72 hours. Thyroid Function Tests: No results for input(s): TSH, T4TOTAL, FREET4, T3FREE, THYROIDAB in the last 72 hours. Anemia Panel: No results for input(s): VITAMINB12, FOLATE, FERRITIN, TIBC, IRON, RETICCTPCT in the last 72 hours. Urine analysis:    Component Value Date/Time   COLORURINE YELLOW 02/17/2018 2051   APPEARANCEUR CLEAR 02/17/2018 2051   LABSPEC 1.025 02/17/2018 2051   PHURINE 5.5 02/17/2018 2051   GLUCOSEU NEGATIVE 02/17/2018 2051   HGBUR NEGATIVE 02/17/2018 2051   BILIRUBINUR NEGATIVE 02/17/2018 2051   KETONESUR NEGATIVE 02/17/2018 2051   PROTEINUR NEGATIVE 02/17/2018 2051   NITRITE NEGATIVE 02/17/2018 2051   LEUKOCYTESUR  NEGATIVE 02/17/2018 2051     Shyne Lehrke M.D. [facility] 02/18/2018, 10:48 AM  Pager: [phone] Between 7am to 7pm - call Pager - [phone]  After 7pm go to [email or web address] - password [email or web address]  Call night coverage person covering after 7pm

## 2018-02-18 NOTE — Progress Notes (Signed)
[place] for Heparin Indication: limb ischemia/thrombus  No Known Allergies  Patient Measurements: Weight: 128 lb 1.4 oz (58.1 kg)  Vital Signs: Temp: 99.4 F (37.4 C) (10/13 0359) Temp Source: Oral (10/13 0359) BP: 125/73 (10/13 0400) Pulse Rate: 87 (10/13 0400)  Labs: Recent Labs    02/17/18 2215 02/18/18 0504 02/18/18 1109  HGB 10.6* 10.1*  --   HCT 32.3* 30.8*  --   PLT 353 401*  --   APTT  --  43* 53*  LABPROT 16.3*  --   --   INR 1.33  --   --   HEPARINUNFRC  --  0.96*  --   CREATININE 1.12 0.98  --   CKTOTAL  --   --  1,047*    CrCl cannot be calculated (Unknown ideal weight.).   Medical History: Past Medical History:  Diagnosis Date  . Blood clot in vein 02/2018  . Dementia ([place])   . Hypertension     Medications:  Medications Prior to Admission  Medication Sig Dispense Refill Last Dose  . amLODipine (NORVASC) 10 MG tablet Take 10 mg by mouth daily.  1 unknown  . amLODIPine-Valsartan-HCTZ 10-320-25 MG TABS Take 1 tablet by mouth daily.  3 unknown  . apixaban (ELIQUIS) KIT 5 kits by Does not apply route once.   02/17/2018 at Unknown time  . metoprolol succinate (TOPROL-XL) 25 MG 24 hr tablet Take 25 mg by mouth daily.  1 unknown  . amlodipine-atorvastatin (CADUET) 10-10 MG tablet Take 1 tablet by mouth daily.   unsure    Assessment: 67 y.o. male with leg ischemia, on Eliquis PTA (last dose 10/12) on hold, and transitioned to heparin; Angiogram tentatively planned for 10/15  aPTT subtherapeutic: 53 seconds, no infusion issues or over bruising or bleeding reported  Goal of Therapy:  APTT 66-102 Heparin level 0.3-0.7 units/ml Monitor platelets by anticoagulation protocol: Yes   Plan:  Increase heparin gtt to 1150 units/hr APTT in 8 hours Daily heparin level, CBC, and aPTT   Georga Bora, PharmD Clinical Pharmacist 02/18/2018 2:52 PM Please check AMION for all [facility] numbers

## 2018-02-18 NOTE — Progress Notes (Signed)
VASCULAR LAB PRELIMINARY  ARTERIAL  ABI completed:    RIGHT    LEFT    PRESSURE WAVEFORM  PRESSURE WAVEFORM  BRACHIAL 157 triphasic BRACHIAL 153 triphasic  DP 47 monophasic DP  absent         PT 99  PT 21 monophasic                  RIGHT LEFT  ABI 0.42 0.13   Rt ABI indicates severe lower extremity arterial disease. Lt ABI indicates critical left limb ischemia.   Malik Stanton 02/18/2018, 8:58 AM

## 2018-02-19 LAB — CBC
HCT: 29.2 % — ABNORMAL LOW (ref 39.0–52.0)
Hemoglobin: 9.6 g/dL — ABNORMAL LOW (ref 13.0–17.0)
MCH: 26.4 pg (ref 26.0–34.0)
MCHC: 32.9 g/dL (ref 30.0–36.0)
MCV: 80.2 fL (ref 80.0–100.0)
NRBC: 0 % (ref 0.0–0.2)
Platelets: 415 10*3/uL — ABNORMAL HIGH (ref 150–400)
RBC: 3.64 MIL/uL — ABNORMAL LOW (ref 4.22–5.81)
RDW: 16.1 % — AB (ref 11.5–15.5)
WBC: 11.2 10*3/uL — ABNORMAL HIGH (ref 4.0–10.5)

## 2018-02-19 LAB — COMPREHENSIVE METABOLIC PANEL
ALBUMIN: 2.2 g/dL — AB (ref 3.5–5.0)
ALK PHOS: 66 U/L (ref 38–126)
ALT: 50 U/L — AB (ref 0–44)
AST: 67 U/L — AB (ref 15–41)
Anion gap: 5 (ref 5–15)
BUN: 9 mg/dL (ref 8–23)
CALCIUM: 8.3 mg/dL — AB (ref 8.9–10.3)
CO2: 23 mmol/L (ref 22–32)
CREATININE: 0.81 mg/dL (ref 0.61–1.24)
Chloride: 112 mmol/L — ABNORMAL HIGH (ref 98–111)
GFR calc Af Amer: >60 mL/min (ref >60)
GFR calc non Af Amer: >60 mL/min (ref >60)
GLUCOSE: 113 mg/dL — AB (ref 70–99)
Potassium: 4.1 mmol/L (ref 3.5–5.1)
Sodium: 140 mmol/L (ref 135–145)
Total Bilirubin: 0.4 mg/dL (ref 0.3–1.2)
Total Protein: 6.1 g/dL — ABNORMAL LOW (ref 6.5–8.1)

## 2018-02-19 LAB — APTT
APTT: 47 s — AB (ref 24–36)
aPTT: 44 seconds — ABNORMAL HIGH (ref 24–36)

## 2018-02-19 LAB — HEPARIN LEVEL (UNFRACTIONATED)
HEPARIN UNFRACTIONATED: 0.32 [IU]/mL (ref 0.30–0.70)
Heparin Unfractionated: 0.21 IU/mL — ABNORMAL LOW (ref 0.30–0.70)
Heparin Unfractionated: 0.31 IU/mL (ref 0.30–0.70)

## 2018-02-19 MED ORDER — HYDRALAZINE HCL 20 MG/ML IJ SOLN: 10.0000 mg | Freq: Four times a day (QID) | INTRAMUSCULAR | Status: DC | PRN

## 2018-02-19 MED ORDER — SODIUM CHLORIDE 0.9 % IV SOLN
INTRAVENOUS | Status: DC
  Administered 2018-02-19 – 2018-02-21 (×4): via INTRAVENOUS

## 2018-02-19 NOTE — Progress Notes (Signed)
  Progress Note    02/19/2018 11:04 AM * No surgery date entered *  Subjective:  No overnight issues  Vitals:   02/18/18 1940 02/19/18 0403  BP: 140/80 (!) 153/80  Pulse: 89 70  Resp: (!) 28 (!) 24  Temp: 99.7 F (37.6 C) 98.6 F (37 C)  SpO2: 100% 95%    Physical Exam: Awake and alert Palpable femoral pulses bilaterally Dressing on left foot clean dry intact  CBC    Component Value Date/Time   WBC 11.2 (H) 02/19/2018 0257   RBC 3.64 (L) 02/19/2018 0257   HGB 9.6 (L) 02/19/2018 0257   HCT 29.2 (L) 02/19/2018 0257   PLT 415 (H) 02/19/2018 0257   MCV 80.2 02/19/2018 0257   MCH 26.4 02/19/2018 0257   MCHC 32.9 02/19/2018 0257   RDW 16.1 (H) 02/19/2018 0257   LYMPHSABS 1.7 02/17/2018 2215   MONOABS 0.5 02/17/2018 2215   EOSABS 0.2 02/17/2018 2215   BASOSABS 0.1 02/17/2018 2215    BMET    Component Value Date/Time   NA 140 02/19/2018 0257   K 4.1 02/19/2018 0257   CL 112 (H) 02/19/2018 0257   CO2 23 02/19/2018 0257   GLUCOSE 113 (H) 02/19/2018 0257   BUN 9 02/19/2018 0257   CREATININE 0.81 02/19/2018 0257   CALCIUM 8.3 (L) 02/19/2018 0257   GFRNONAA >60 02/19/2018 0257   GFRAA >60 02/19/2018 0257    INR    Component Value Date/Time   INR 1.33 02/17/2018 2215     Intake/Output Summary (Last 24 hours) at 02/19/2018 1104 Last data filed at 02/19/2018 0900 Gross per 24 hour  Intake 1324.13 ml  Output 2825 ml  Net -1500.87 ml   ABI 0.42/0.13  Assessment:  67 y.o. male is here with left leg ulceration and severe depression of bilateral ABIs  Plan: I discussed with his granddaughter the plan to proceed with aortogram bilateral lower extremity runoff possible intervention on the left tomorrow she demonstrates good understanding and states that she is the one that signed consent.  He will need to be n.p.o. past midnight  Destiny Trickey C. Randie Heinz, MD [facility] Office: [phone] Pager: [phone]  02/19/2018 11:04  AM

## 2018-02-19 NOTE — Progress Notes (Signed)
ANTICOAGULATION CONSULT NOTE   Pharmacy Consult for heparin Indication: limb ischemia/thrombus  No Known Allergies  Patient Measurements: Weight: 124 lb 9 oz (56.5 kg)  Vital Signs: Temp: 99.3 F (37.4 C) (10/14 1323) Temp Source: Oral (10/14 1323) BP: 136/73 (10/14 1323) Pulse Rate: 74 (10/14 1323)  Labs: Recent Labs    02/17/18 2215  02/18/18 0504 02/18/18 1109 02/18/18 2231 02/19/18 0257 02/19/18 1424  HGB 10.6*  --  10.1*  --   --  9.6*  --   HCT 32.3*  --  30.8*  --   --  29.2*  --   PLT 353  --  401*  --   --  415*  --   APTT  --    < > 43* 53* 54* 47* 44*  LABPROT 16.3*  --   --   --   --   --   --   INR 1.33  --   --   --   --   --   --   HEPARINUNFRC  --   --  0.96*  --   --  0.32 0.21*  CREATININE 1.12  --  0.98  --   --  0.81  --   CKTOTAL  --   --   --  1,047*  --   --   --    < > = values in this interval not displayed.     Assessment: 67 y.o. male with leg ischemia, on Eliquis PTA (last dose 10/12) on hold, and transitioned to heparin drip; Angiogram planned for 10/15.  Heparin level is subtherapeutic at 0.21, aPTT is subtherapeutic at 44 on 1300 units/hr. Both levels are correlating therefore will stop aPTT's. No bleeding noted.  Goal of Therapy:  Heparin level 0.3-0.7 units/ml Monitor platelets by anticoagulation protocol: Yes   Plan:  Increase heparin drip to 1450 units/hr 6 hr heparin level Daily heparin level, CBC Monitor for s/sx of bleeding   Loura Back, PharmD, BCPS Clinical Pharmacist Clinical phone for 02/19/2018 until 10p is [phone] Please check AMION for all Pharmacist numbers by unit 02/19/2018 3:30 PM

## 2018-02-19 NOTE — Progress Notes (Signed)
ANTICOAGULATION CONSULT NOTE   Pharmacy Consult for Heparin Indication: limb ischemia/thrombus  No Known Allergies  Patient Measurements: Weight: 124 lb 9 oz (56.5 kg)  Vital Signs: Temp: 99 F (37.2 C) (10/14 2347) Temp Source: Oral (10/14 2347) BP: 146/76 (10/14 2112) Pulse Rate: 94 (10/14 2113)  Labs: Recent Labs    02/17/18 2215  02/18/18 0504 02/18/18 1109 02/18/18 2231 02/19/18 0257 02/19/18 1424 02/19/18 2259  HGB 10.6*  --  10.1*  --   --  9.6*  --   --   HCT 32.3*  --  30.8*  --   --  29.2*  --   --   PLT 353  --  401*  --   --  415*  --   --   APTT  --    < > 43* 53* 54* 47* 44*  --   LABPROT 16.3*  --   --   --   --   --   --   --   INR 1.33  --   --   --   --   --   --   --   HEPARINUNFRC  --    < > 0.96*  --   --  0.32 0.21* 0.31  CREATININE 1.12  --  0.98  --   --  0.81  --   --   CKTOTAL  --   --   --  1,047*  --   --   --   --    < > = values in this interval not displayed.     Assessment: 67 y.o. male with leg ischemia, on Eliquis PTA (last dose 10/12) on hold, and transitioned to heparin; Angiogram tentatively planned for 10/15  Heparin level therapeutic x 1 this PM after rate increase, aPTT and heparin level now correlate, using heparin level only to dose  Goal of Therapy:  Heparin level 0.3-0.7 units/ml Monitor platelets by anticoagulation protocol: Yes   Plan:  Cont heparin 1450 units/hr Confirmatory heparin level with AM labs Daily heparin level, CBC  Abran Duke, PharmD, BCPS Clinical Pharmacist Phone: [phone]

## 2018-02-19 NOTE — Progress Notes (Signed)
ANTICOAGULATION CONSULT NOTE   Pharmacy Consult for Heparin Indication: limb ischemia/thrombus  No Known Allergies  Patient Measurements: Weight: 124 lb 9 oz (56.5 kg)  Vital Signs: Temp: 98.6 F (37 C) (10/14 0403) Temp Source: Oral (10/14 0403) BP: 153/80 (10/14 0403) Pulse Rate: 70 (10/14 0403)  Labs: Recent Labs    02/17/18 2215  02/18/18 0504 02/18/18 1109 02/18/18 2231 02/19/18 0257  HGB 10.6*  --  10.1*  --   --  9.6*  HCT 32.3*  --  30.8*  --   --  29.2*  PLT 353  --  401*  --   --  415*  APTT  --    < > 43* 53* 54* 47*  LABPROT 16.3*  --   --   --   --   --   INR 1.33  --   --   --   --   --   HEPARINUNFRC  --   --  0.96*  --   --  0.32  CREATININE 1.12  --  0.98  --   --  0.81  CKTOTAL  --   --   --  1,047*  --   --    < > = values in this interval not displayed.     Assessment: 67 y.o. male with leg ischemia, on Eliquis PTA (last dose 10/12) on hold, and transitioned to heparin; Angiogram tentatively planned for 10/15  aPTT subtherapeutic at 47, still a small amount Apixaban influence on the heparin level-should be gone soon, no issues per RN.   Goal of Therapy:  APTT 66-102 secs Heparin level 0.3-0.7 units/ml Monitor platelets by anticoagulation protocol: Yes   Plan:  Inc heparin to 1300 units/hr 1400 aPTT/HL Daily heparin level, CBC, and aPTT  Abran Duke, PharmD, BCPS Clinical Pharmacist Phone: [phone]

## 2018-02-19 NOTE — Progress Notes (Signed)
[facility]                                                                              Patient Demographics  Malik Stanton, is a 67 y.o. male, DOB - Mar 30, 1951, ZOX:[id number]  Admit date - 02/17/2018   Admitting Physician Clydie Braun, MD  Outpatient Primary MD for the patient is Patient, No Pcp Per  Outpatient specialists:   LOS - 1  days   Medical records reviewed and are as summarized below:    Chief Complaint  Patient presents with  . Leg Swelling  . Urinary Incontinence       Brief summary   Malik Stanton  is a 67 y.o. 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 Pt was transferred from [facility] regional to [place] on 10/5, and apparently unable to have angiogram due to fever of 102.  Pt was discharged on Eliquis and referred back to vascular surgery at [facility]. Patient was brought by granddaughter as the wounds were not healing.  Vascular surgery was consulted due to concern for critical limb ischemia.  Assessment & Plan    Principal Problem:   Critical left lower limb ischemia -Eliquis was placed on hold and patient was placed on IV heparin drip -ABIs showed left critical left limb ischemia (0.13) and right severe lower extremity arterial disease -Vascular surgery consulted, plan for aortogram, B/L LE runoff with intervention on 10/15, n.p.o. after midnight.   Active Problems: Severe hypokalemia Potassium replaced, currently stable  Abnormal liver function test -Reviewed right upper quadrant ultrasound which shows possible gallbladder dysfunction, no pericholecystic fluid or other secondary signs of acute cholecystitis.  Recommended HIDA scan.  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 versus rhabdomyolysis -Continue to hold statin -If patient spikes any fevers, will place on IV Zosyn  Dehydration with lactic acidosis, possible acute rhabdomyolysis -Continue IV fluid hydration  Severe PAD with left lower extremity ulcers -Wound care consulted,  continue wound care as recommended -Continue heparin drip -Hold Lipitor due to abnormal LFTs, elevated CK  Hypertension -BP stable, continue metoprolol, amlodipine -Added hydralazine IV as needed with parameters  Dementia:  -Unable to provide much history, may need higher level of care at discharge   Code Status: FULL CODE  DVT Prophylaxis: Heparin drip Family Communication: Discussed in detail with the patient, all imaging results, lab results explained to the patient    Disposition Plan: Will likely need higher level of care  Time Spent in minutes   25 minutes  Procedures:  ABIs Right upper quadrant ultrasound  Consultants:   Vascular surgery  Antimicrobials:   None   Medications  Scheduled Meds: . amLODipine  10 mg Oral Daily  . metoprolol tartrate  25 mg Oral BID   Continuous Infusions: . heparin 1,300 Units/hr (02/19/18 0523)   PRN Meds:.acetaminophen **OR** acetaminophen  Antibiotics   Anti-infectives (From admission, onward)   None        Subjective:   Malik Stanton was seen and examined today.  No acute complaints.  Denies any chest pain, shortness of breath, abdominal pain.  Pain in the LLE controlled  Objective:   Vitals:   02/18/18 0400 02/18/18 1100 02/18/18 1940 02/19/18 0403  BP: 125/73 125/77 140/80 (!) 153/80  Pulse: 87 80 89 70  Resp: 20 17 (!) 28 (!) 24  Temp:   99.7 F (37.6 C) 98.6 F (37 C)  TempSrc:   Oral Oral  SpO2: 96% 95% 100% 95%  Weight:    56.5 kg    Intake/Output Summary (Last 24 hours) at 02/19/2018 1214 Last data filed at 02/19/2018 0900 Gross  per 24 hour  Intake 1324.13 ml  Output 2825 ml  Net -1500.87 ml     Wt Readings from Last 3 Encounters:  02/19/18 56.5 kg     Exam    General: Alert and oriented to self and place, NAD  Eyes:   HEENT:    Cardiovascular: S1 S2 auscultated, Regular rate and rhythm. No pedal edema b/l  Respiratory: CTAB  Gastrointestinal: Soft, NT, ND, + bowel sounds  Ext: no pedal edema bilaterally  Neuro: no FND's  Musculoskeletal: No digital cyanosis, clubbing  Skin: Left lower extremity dressing intact  Psych: dementia    Data Reviewed:  I have personally reviewed following labs and imaging studies  Micro Results No results found for this or any previous visit (from the past 240 hour(s)).  Radiology Reports Dg Chest 2 View  Result Date: 02/17/2018 CLINICAL DATA:  Wounds on left foot and left lower leg. Urinary incontinence. Chronic cough. Smoker. Hypertension. EXAM: CHEST - 2 VIEW COMPARISON:  10/15/2016 FINDINGS: Emphysematous changes in the lungs with scattered central interstitial pattern suggesting chronic bronchitis. Heart size and pulmonary vascularity are normal. No airspace disease or consolidation in the lungs. No blunting of costophrenic angles. No pneumothorax. Calcification of the aorta. Mediastinal contours appear intact. Degenerative changes in the spine. IMPRESSION: Emphysematous and chronic bronchitic changes in the lungs. No evidence of active pulmonary disease. Electronically Signed   By: Burman Nieves M.D.   On: 02/17/2018 23:16   Dg Tibia/fibula Left  Result Date: 02/17/2018 CLINICAL DATA:  Wounds on left foot and left lower leg. Urinary incontinence. Chronic cough. Smoker. Hypertension. EXAM: LEFT TIBIA AND FIBULA - 2 VIEW COMPARISON:  None. FINDINGS: Degenerative changes in the left knee with medial and lateral compartment narrowing, small osteophyte formation, and cartilaginous calcifications. No evidence of acute fracture or dislocation involving the  tibia or fibula. Cortical thickening along the posterior distal tibial metaphysis probably representing either old fracture deformity or possibly an exostosis. Ossification of the distal tibiofibular ligament. Vascular calcifications. IMPRESSION: Degenerative changes in the left knee. Old appearing deformity of the distal tibial metaphysis. No acute bony abnormalities. Electronically Signed   By: Burman Nieves M.D.   On: 02/17/2018 23:18   Dg Foot Complete Left  Result Date: 02/17/2018 CLINICAL DATA:  Wounds on left foot and left lower leg. Urinary incontinence. Chronic cough. Smoker. Hypertension. EXAM: LEFT FOOT - COMPLETE 3+ VIEW COMPARISON:  None. FINDINGS: Degenerative changes in the interphalangeal joints and first metatarsal-phalangeal joint. Mild degenerative changes in the intertarsal joints. No evidence of acute fracture or dislocation of the left foot. No focal bone lesion or bone destruction. Soft tissues are unremarkable. IMPRESSION: Degenerative changes in the left foot. No acute bony abnormalities. Electronically Signed  By: Burman Nieves M.D.   On: 02/17/2018 23:19   US Abdomen Limited Ruq  Result Date: 02/18/2018 CLINICAL DATA:  Abnormal LFTs. EXAM: ULTRASOUND ABDOMEN LIMITED RIGHT UPPER QUADRANT COMPARISON:  None. FINDINGS: Gallbladder: Gallbladder is contracted. Gallbladder walls appear thickened, but this is likely accentuated by the gallbladder contraction. Small stones versus polyps at the gallbladder fundus. Common bile duct: Diameter: Within normal limits of 5-6 mm. Liver: No focal lesion identified. Within normal limits in parenchymal echogenicity. Portal vein is patent on color Doppler imaging with normal direction of blood flow towards the liver. IMPRESSION: 1. Gallbladder is contracted, despite being NPO for 6 hours. This may indicate gallbladder dysfunction. Gallbladder walls are thickened, but this is likely accentuated by the gallbladder contraction. No pericholecystic  fluid, sonographic Murphy's sign or other secondary signs of acute cholecystitis. Gallbladder wall thickening is nonspecific with differential considerations of acute or chronic cholecystitis, hypoproteinemia, CHF and reactive thickening secondary to adjacent liver disease. Would consider nuclear medicine HIDA scan to ensure normal gallbladder function and exclude cholecystitis. 2. Small polyps versus small stones at the gallbladder fundus. 3. Liver appears normal. 4. No bile duct dilatation. Electronically Signed   By: Bary Richard M.D.   On: 02/18/2018 10:02    Lab Data:  CBC: Recent Labs  Lab 02/17/18 2215 02/18/18 0504 02/19/18 0257  WBC 12.1* 11.2* 11.2*  NEUTROABS 9.5*  --   --   HGB 10.6* 10.1* 9.6*  HCT 32.3* 30.8* 29.2*  MCV 81.4 79.6* 80.2  PLT 353 401* 415*   Basic Metabolic Panel: Recent Labs  Lab 02/17/18 2215 02/18/18 0504 02/18/18 1109 02/19/18 0257  NA 140 138  --  140  K 2.7* 2.9* 3.5 4.1  CL 102 104  --  112*  CO2 27 24  --  23  GLUCOSE 128* 115*  --  113*  BUN 28* 21  --  9  CREATININE 1.12 0.98  --  0.81  CALCIUM 8.6* 8.5*  --  8.3*  MG 2.3  --   --   --    GFR: CrCl cannot be calculated (Unknown ideal weight.). Liver Function Tests: Recent Labs  Lab 02/17/18 2215 02/18/18 0504 02/19/18 0257  AST 66* 66* 67*  ALT 52* 50* 50*  ALKPHOS 78 73 66  BILITOT 0.5 0.5 0.4  PROT 7.1 6.3* 6.1*  ALBUMIN 2.9* 2.5* 2.2*   No results for input(s): LIPASE, AMYLASE in the last 168 hours. No results for input(s): AMMONIA in the last 168 hours. Coagulation Profile: Recent Labs  Lab 02/17/18 2215  INR 1.33   Cardiac Enzymes: Recent Labs  Lab 02/18/18 1109  CKTOTAL 1,047*   BNP (last 3 results) No results for input(s): PROBNP in the last 8760 hours. HbA1C: No results for input(s): HGBA1C in the last 72 hours. CBG: No results for input(s): GLUCAP in the last 168 hours. Lipid Profile: No results for input(s): CHOL, HDL, LDLCALC, TRIG, CHOLHDL,  LDLDIRECT in the last 72 hours. Thyroid Function Tests: No results for input(s): TSH, T4TOTAL, FREET4, T3FREE, THYROIDAB in the last 72 hours. Anemia Panel: No results for input(s): VITAMINB12, FOLATE, FERRITIN, TIBC, IRON, RETICCTPCT in the last 72 hours. Urine analysis:    Component Value Date/Time   COLORURINE YELLOW 02/17/2018 2051   APPEARANCEUR CLEAR 02/17/2018 2051   LABSPEC 1.025 02/17/2018 2051   PHURINE 5.5 02/17/2018 2051   GLUCOSEU NEGATIVE 02/17/2018 2051   HGBUR NEGATIVE 02/17/2018 2051   BILIRUBINUR NEGATIVE 02/17/2018 2051   KETONESUR NEGATIVE 02/17/2018 2051  PROTEINUR NEGATIVE 02/17/2018 2051   NITRITE NEGATIVE 02/17/2018 2051   LEUKOCYTESUR NEGATIVE 02/17/2018 2051     Ripudeep Rai M.D. [facility] 02/19/2018, 12:14 PM  Pager: [phone] Between 7am to 7pm - call Pager - [phone]  After 7pm go to [email or web address] - password [email or web address]  Call night coverage person covering after 7pm

## 2018-02-20 ENCOUNTER — Encounter (HOSPITAL_COMMUNITY): Payer: Self-pay | Admitting: General Practice

## 2018-02-20 ENCOUNTER — Encounter (HOSPITAL_COMMUNITY)
Admission: EM | Disposition: A | Discharge status: Home / Self Care | Payer: Self-pay | Source: Home / Self Care | Attending: Internal Medicine

## 2018-02-20 HISTORY — PX: ABDOMINAL AORTOGRAM W/LOWER EXTREMITY: CATH118223

## 2018-02-20 HISTORY — PX: PERIPHERAL VASCULAR INTERVENTION: CATH118257

## 2018-02-20 LAB — COMPREHENSIVE METABOLIC PANEL
ALBUMIN: 2.2 g/dL — AB (ref 3.5–5.0)
ALK PHOS: 62 U/L (ref 38–126)
ALT: 45 U/L — ABNORMAL HIGH (ref 0–44)
ANION GAP: 9 (ref 5–15)
AST: 53 U/L — ABNORMAL HIGH (ref 15–41)
BUN: 6 mg/dL — ABNORMAL LOW (ref 8–23)
CALCIUM: 8.3 mg/dL — AB (ref 8.9–10.3)
CO2: 20 mmol/L — AB (ref 22–32)
Chloride: 109 mmol/L (ref 98–111)
Creatinine, Ser: 0.72 mg/dL (ref 0.61–1.24)
GFR calc Af Amer: >60 mL/min (ref >60)
GFR calc non Af Amer: >60 mL/min (ref >60)
GLUCOSE: 103 mg/dL — AB (ref 70–99)
Potassium: 3.5 mmol/L (ref 3.5–5.1)
SODIUM: 138 mmol/L (ref 135–145)
Total Bilirubin: 0.5 mg/dL (ref 0.3–1.2)
Total Protein: 6 g/dL — ABNORMAL LOW (ref 6.5–8.1)

## 2018-02-20 LAB — CK: CK TOTAL: 1137 U/L — AB (ref 49–397)

## 2018-02-20 LAB — HEPATITIS PANEL, ACUTE
HCV Ab: 0.2 s/co ratio (ref 0.0–0.9)
HEP A IGM: NEGATIVE
Hep B C IgM: NEGATIVE
Hepatitis B Surface Ag: NEGATIVE

## 2018-02-20 LAB — CBC
HCT: 29.4 % — ABNORMAL LOW (ref 39.0–52.0)
HEMOGLOBIN: 9.7 g/dL — AB (ref 13.0–17.0)
MCH: 26.7 pg (ref 26.0–34.0)
MCHC: 33 g/dL (ref 30.0–36.0)
MCV: 81 fL (ref 80.0–100.0)
NRBC: 0 % (ref 0.0–0.2)
Platelets: 491 10*3/uL — ABNORMAL HIGH (ref 150–400)
RBC: 3.63 MIL/uL — ABNORMAL LOW (ref 4.22–5.81)
RDW: 16.6 % — ABNORMAL HIGH (ref 11.5–15.5)
WBC: 12 10*3/uL — ABNORMAL HIGH (ref 4.0–10.5)

## 2018-02-20 LAB — PROTIME-INR
INR: 1.27
Prothrombin Time: 15.8 seconds — ABNORMAL HIGH (ref 11.4–15.2)

## 2018-02-20 LAB — HEPARIN LEVEL (UNFRACTIONATED): HEPARIN UNFRACTIONATED: 0.26 [IU]/mL — AB (ref 0.30–0.70)

## 2018-02-20 SURGERY — ABDOMINAL AORTOGRAM W/LOWER EXTREMITY
Anesthesia: LOCAL

## 2018-02-20 MED ORDER — LABETALOL HCL 5 MG/ML IV SOLN: 10.0000 mg | INTRAVENOUS | Status: DC | PRN

## 2018-02-20 MED ORDER — SODIUM CHLORIDE 0.9 % WEIGHT BASED INFUSION
1.0000 mL/kg/h | INTRAVENOUS | Status: DC
  Administered 2018-02-20: 1 mL/kg/h via INTRAVENOUS

## 2018-02-20 MED ORDER — HEPARIN (PORCINE) IN NACL 1000-0.9 UT/500ML-% IV SOLN
INTRAVENOUS | Status: DC | PRN
  Administered 2018-02-20: 500 mL

## 2018-02-20 MED ORDER — CLOPIDOGREL BISULFATE 300 MG PO TABS
ORAL_TABLET | ORAL | Status: DC | PRN
  Administered 2018-02-20: 300 mg via ORAL

## 2018-02-20 MED ORDER — OXYCODONE HCL 5 MG PO TABS: 5.0000 mg | ORAL_TABLET | ORAL | Status: DC | PRN

## 2018-02-20 MED ORDER — HEPARIN (PORCINE) IN NACL 1000-0.9 UT/500ML-% IV SOLN
INTRAVENOUS | Status: AC
  Filled 2018-02-20: qty 500

## 2018-02-20 MED ORDER — FENTANYL CITRATE (PF) 100 MCG/2ML IJ SOLN
INTRAMUSCULAR | Status: AC
  Filled 2018-02-20: qty 2

## 2018-02-20 MED ORDER — ACETAMINOPHEN 325 MG PO TABS
650.0000 mg | ORAL_TABLET | ORAL | Status: DC | PRN
  Administered 2018-02-20: 650 mg via ORAL
  Filled 2018-02-20: qty 2

## 2018-02-20 MED ORDER — CLOPIDOGREL BISULFATE 300 MG PO TABS
ORAL_TABLET | ORAL | Status: AC
  Filled 2018-02-20: qty 1

## 2018-02-20 MED ORDER — LIDOCAINE HCL (PF) 1 % IJ SOLN
INTRAMUSCULAR | Status: DC | PRN
  Administered 2018-02-20: 15 mL

## 2018-02-20 MED ORDER — CLOPIDOGREL BISULFATE 75 MG PO TABS: 75.0000 mg | ORAL_TABLET | Freq: Every day | ORAL | Status: DC

## 2018-02-20 MED ORDER — SODIUM CHLORIDE 0.9% FLUSH
3.0000 mL | Freq: Two times a day (BID) | INTRAVENOUS | Status: DC
  Administered 2018-02-21: 3 mL via INTRAVENOUS

## 2018-02-20 MED ORDER — CLOPIDOGREL BISULFATE 75 MG PO TABS: 300.0000 mg | ORAL_TABLET | Freq: Once | ORAL | Status: DC

## 2018-02-20 MED ORDER — PNEUMOCOCCAL VAC POLYVALENT 25 MCG/0.5ML IJ INJ
0.5000 mL | INJECTION | INTRAMUSCULAR | Status: AC
  Administered 2018-02-21: 0.5 mL via INTRAMUSCULAR
  Filled 2018-02-20: qty 0.5

## 2018-02-20 MED ORDER — HYDRALAZINE HCL 20 MG/ML IJ SOLN: 5.0000 mg | INTRAMUSCULAR | Status: DC | PRN

## 2018-02-20 MED ORDER — SODIUM CHLORIDE 0.9% FLUSH: 3.0000 mL | INTRAVENOUS | Status: DC | PRN

## 2018-02-20 MED ORDER — SODIUM CHLORIDE 0.9 % IV SOLN: 250.0000 mL | INTRAVENOUS | Status: DC | PRN

## 2018-02-20 MED ORDER — ONDANSETRON HCL 4 MG/2ML IJ SOLN: 4.0000 mg | Freq: Four times a day (QID) | INTRAMUSCULAR | Status: DC | PRN

## 2018-02-20 MED ORDER — HEPARIN SODIUM (PORCINE) 1000 UNIT/ML IJ SOLN
INTRAMUSCULAR | Status: AC
  Filled 2018-02-20: qty 1

## 2018-02-20 MED ORDER — IODIXANOL 320 MG/ML IV SOLN
INTRAVENOUS | Status: DC | PRN
  Administered 2018-02-20: 168 mg via INTRA_ARTERIAL

## 2018-02-20 MED ORDER — CLOPIDOGREL BISULFATE 75 MG PO TABS
75.0000 mg | ORAL_TABLET | Freq: Every day | ORAL | Status: DC
  Administered 2018-02-21 – 2018-02-22 (×2): 75 mg via ORAL
  Filled 2018-02-20 (×2): qty 1

## 2018-02-20 MED ORDER — HEPARIN (PORCINE) IN NACL 100-0.45 UNIT/ML-% IJ SOLN
1700.0000 [IU]/h | INTRAMUSCULAR | Status: DC
  Administered 2018-02-20: 1550 [IU]/h via INTRAVENOUS
  Filled 2018-02-20 (×2): qty 250

## 2018-02-20 MED ORDER — LIDOCAINE HCL (PF) 1 % IJ SOLN
INTRAMUSCULAR | Status: AC
  Filled 2018-02-20: qty 30

## 2018-02-20 MED ORDER — FENTANYL CITRATE (PF) 100 MCG/2ML IJ SOLN
INTRAMUSCULAR | Status: DC | PRN
  Administered 2018-02-20: 25 ug via INTRAVENOUS

## 2018-02-20 MED ORDER — HEPARIN SODIUM (PORCINE) 1000 UNIT/ML IJ SOLN
INTRAMUSCULAR | Status: DC | PRN
  Administered 2018-02-20: 7000 [IU] via INTRAVENOUS

## 2018-02-20 SURGICAL SUPPLY — 31 items
BALLN MUSTANG 5X200X135 (BALLOONS) ×3
BALLN MUSTANG 7.0X40 75 (BALLOONS) ×3
BALLOON MUSTANG 5X200X135 (BALLOONS) ×2 IMPLANT
BALLOON MUSTANG 7.0X40 75 (BALLOONS) ×2 IMPLANT
CATH CXI SUPP ST 4FR 135CM (CATHETERS) ×3 IMPLANT
CATH MUSTANG 3X200X135 (BALLOONS) ×3 IMPLANT
CATH NAVICROSS ANG 65CM (CATHETERS) ×2 IMPLANT
CATH OMNI FLUSH 5F 65CM (CATHETERS) ×3 IMPLANT
CATH SOFT-VU 4F 65 STRAIGHT (CATHETERS) ×2 IMPLANT
CATH SOFT-VU STRAIGHT 4F 65CM (CATHETERS) ×1
CATHETER NAVICROSS ANG 65CM (CATHETERS) ×3
CLOSURE MYNX CONTROL 6F/7F (Vascular Products) ×3 IMPLANT
DEVICE TORQUE .025-.038 (MISCELLANEOUS) ×3 IMPLANT
GUIDEWIRE ANGLED .035X260CM (WIRE) ×3 IMPLANT
KIT ENCORE 26 ADVANTAGE (KITS) ×3 IMPLANT
KIT MICROPUNCTURE NIT STIFF (SHEATH) ×3 IMPLANT
KIT PV (KITS) ×3 IMPLANT
SHEATH FLEX ANSEL ANG 6F 45CM (SHEATH) ×3 IMPLANT
SHEATH PINNACLE 5F 10CM (SHEATH) ×3 IMPLANT
SHEATH PINNACLE 6F 10CM (SHEATH) ×3 IMPLANT
SHEATH PROBE COVER 6X72 (BAG) ×3 IMPLANT
STENT INNOVA 5X120X130 (Permanent Stent) ×3 IMPLANT
STENT INNOVA 5X150X130 (Permanent Stent) ×3 IMPLANT
STENT INNOVA 5X80X130 (Permanent Stent) ×3 IMPLANT
STENT INNOVA 7X40X130 (Permanent Stent) ×3 IMPLANT
SYR MEDRAD MARK V 150ML (SYRINGE) ×3 IMPLANT
TRANSDUCER W/STOPCOCK (MISCELLANEOUS) ×3 IMPLANT
TRAY PV CATH (CUSTOM PROCEDURE TRAY) ×3 IMPLANT
WIRE BENTSON .035X145CM (WIRE) ×3 IMPLANT
WIRE HI TORQ VERSACORE J 260CM (WIRE) ×3 IMPLANT
WIRE ROSEN-J .035X260CM (WIRE) ×3 IMPLANT

## 2018-02-20 NOTE — Progress Notes (Signed)
ANTICOAGULATION CONSULT NOTE   Pharmacy Consult for heparin Indication: limb ischemia/thrombus  No Known Allergies  Patient Measurements: Height: 5\' 10"  (177.8 cm)(per pt.) Weight: 124 lb 9 oz (56.5 kg) IBW/kg (Calculated) : 73  Vital Signs: Temp: 97.7 F (36.5 C) (10/15 1514) Temp Source: Oral (10/15 1514) BP: 141/77 (10/15 1800) Pulse Rate: 93 (10/15 1800)  Labs: Recent Labs    02/17/18 2215  02/18/18 0504 02/18/18 1109 02/18/18 2231 02/19/18 0257 02/19/18 1424 02/19/18 2259 02/20/18 0425  HGB 10.6*  --  10.1*  --   --  9.6*  --   --  9.7*  HCT 32.3*  --  30.8*  --   --  29.2*  --   --  29.4*  PLT 353  --  401*  --   --  415*  --   --  491*  APTT  --    < > 43* 53* 54* 47* 44*  --   --   LABPROT 16.3*  --   --   --   --   --   --   --  15.8*  INR 1.33  --   --   --   --   --   --   --  1.27  HEPARINUNFRC  --    < > 0.96*  --   --  0.32 0.21* 0.31 0.26*  CREATININE 1.12  --  0.98  --   --  0.81  --   --  0.72  CKTOTAL  --   --   --  1,047*  --   --   --   --  1,137*   < > = values in this interval not displayed.     Assessment: 67 y.o. male with leg ischemia, on Eliquis PTA (last dose 10/12) on hold, and transitioned to heparin drip; underwent aortogram with stent placed in L SFA and external iliac artery.  Procedure completed at 1500 - discussed with Vascular, okay to resume heparin 6 hours after procedure. Will resume at previous rate of 1550 units/hr. No s/sx of bleeding. CBC stable earlier today.   Goal of Therapy:  Heparin level 0.3-0.7 units/ml Monitor platelets by anticoagulation protocol: Yes   Plan:  Restart heparin infusion at 1550 units/hr on 10/15@2100  Monitor heparin level with morning labs Daily heparin level, CBC Monitor for s/sx of bleeding  Girard Cooter, PharmD Clinical Pharmacist  Pager: [phone] Phone: [phone] Please check AMION for all [facility] numbers 02/20/2018 6:27 PM

## 2018-02-20 NOTE — Progress Notes (Signed)
Pt was back to the unit after procedure at 03:15 pm.. Appeared alert and oriented x 2 ( to himself and place), disoriented to time and situations. Vital signs stable. His puncture side on his right groin was dry, clean and intact,no pain, no bleeding or hematoma. Will monitor.  Filiberto Pinks, RN

## 2018-02-20 NOTE — Op Note (Signed)
    Patient name: Malik Stanton MRN: [id number] DOB: 1951/02/22 Sex: male  02/20/2018 Pre-operative Diagnosis: Critical left lower extremity ischemia Post-operative diagnosis:  Same Surgeon:  Apolinar Junes C. Randie Heinz, MD Procedure Performed: 1.  Ultrasound-guided cannulation right common femoral artery 2.  Aortogram with bilateral lower extremity runoff 3.  Stent of left SFA with 5 x 150, 5 x 120, 5 x 80 mm innova 4.  Stent of left external iliac artery with 7 x 40 mm Innova 5.  minx device closure right common femoral  Indications: 67 year old male with left lower extremely wounds.  He has an ABI of 0.13 is indicated for angiogram possible intervention.  Findings: The aorta is patent.  Right iliac arteries common and external appear patent.  On the left side there is a 80% stenosis of the external iliac artery which is resolved to 0% after stenting.  Left SFA is occluded after short takeoff reconstitutes above-knee popliteal.  After stenting there is no further residual stenosis or dissection he has runoff via the posterior tibial.  On the right side he has runoff to the knee via the SFA which is moderately diseased and then his tibial vessels appear to occlude.  He would need concentrated injections from the popliteal artery below the knee to identify the tibial artery occlusions if he were to need intervention there.   Procedure:  The patient was identified in the holding area and taken to room 8.  The patient was then placed supine on the table and prepped and draped in the usual sterile fashion.  A time out was called.  Ultrasound was used to evaluate the right common femoral artery which was noted to be patent and an image was saved the permanent record.  It was cannulated with micro puncture needle followed by wire and sheath and Bentson wire was placed followed by 5 French sheath.  Omni Flush catheter was placed to level of L1 and aortogram with bilateral lower extremity runoff was performed with the  above findings.  We then crossed the bifurcation using Omni Flush catheter followed by Glidewire and straight catheter used a Rosen catheter to get a 6 [place] sheath up and over the bifurcation.  Patient was fully heparinized at this time.  We was Glidewire and long CXI with 3 5 catheter to cross through the occluded SFA confirmed intraluminal access above the knee.  Followed by this we then dilated the SFA with 3 mm balloon throughout its course.  We then proceeded with stenting distally to proximal with first a 5 x 150 followed by 5 x 120 followed by 5 x 80 stent up to the level of the takeoff of the SFA.  These were all postdilated with 5 mm balloon.  Completion demonstrated no further residual stenosis no dissection there was runoff at least of the tibial vessels proximally.  We then pulled our sheath back performed angiogram demonstrated our nearly occluded external iliac artery placed a 7 x 40 mm stent there and postdilated this with 7 mm balloon.  I completion there was no further residual stenosis.  Patient was given Plavix 300 mg on the table.  We exchanged for short 6 French sheath deployed a minx device.  He did tolerate this procedure well without immediate comp occasion.    Contrast: 168cc   Malik C. Randie Heinz, MD [facility] Office: [phone] Pager: [phone]

## 2018-02-20 NOTE — Progress Notes (Signed)
[facility]                                                                              Patient Demographics  Malik Stanton, is a 67 y.o. male, DOB - 03-23-1951, ZOX:[id number]  Admit date - 02/17/2018   Admitting Physician Clydie Braun, MD  Outpatient Primary MD for the patient is Patient, No Pcp Per  Outpatient specialists:   LOS - 2  days   Medical records reviewed and are as summarized below:    Chief Complaint  Patient presents with  . Leg Swelling  . Urinary Incontinence       Brief summary   Malik Stanton  is a 67 y.o. 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 Pt was transferred from [facility] regional to [place] on 10/5, and apparently unable to have angiogram due to fever of 102.  Pt was discharged on Eliquis and referred back to vascular surgery at [facility]. Patient was brought by granddaughter as the wounds were not healing.  Vascular surgery was consulted due to concern for critical limb ischemia.  Assessment & Plan    Principal Problem:   Critical left lower limb ischemia -Eliquis was placed on hold and patient was placed on IV heparin drip -ABIs showed left critical left limb ischemia (0.13) and right severe lower extremity arterial disease -Vascular surgery consulted, plan for aortogram, bilateral lower extremity runoff with intervention today, NPO   Active Problems: Severe hypokalemia Potassium stable 2.5  Abnormal liver function test -Reviewed right upper quadrant ultrasound which shows possible gallbladder dysfunction, no pericholecystic fluid or other secondary signs of acute cholecystitis.  Recommended HIDA scan.  Currently no nausea vomiting abdominal pain or  any signs of cholecystitis -CK elevated, possibly has statin induced myopathy versus rhabdomyolysis -Continue to hold statin -If patient spikes any fevers, will place on IV Zosyn  Dehydration with lactic acidosis, possible acute rhabdomyolysis -Continue IV fluid hydration -CK trending up 1137  Severe PAD with left lower extremity ulcers -Wound care consulted,  continue wound care as recommended -Continue heparin drip -Hold Lipitor due to abnormal LFTs, elevated CK  Hypertension -BP slightly elevated, continue metoprolol, amlodipine, hydralazine IV as needed with parameters   Dementia:  -Unable to provide much history, may need higher level of care at discharge   Code Status: FULL CODE  DVT Prophylaxis: Heparin drip Family Communication: Discussed in detail with the patient, all imaging results, lab results explained to the patient    Disposition Plan: Will likely need higher level of care  Time Spent in minutes   25 minutes  Procedures:  ABIs Right upper quadrant ultrasound  Consultants:   Vascular surgery  Antimicrobials:   None   Medications  Scheduled Meds: . amLODipine  10 mg Oral Daily  . metoprolol tartrate  25 mg Oral BID   Continuous Infusions: . sodium chloride 75 mL/hr at 02/20/18 0206  . heparin  1,550 Units/hr (02/20/18 0953)   PRN Meds:.acetaminophen **OR** acetaminophen, hydrALAZINE   Antibiotics   Anti-infectives (From admission, onward)   None        Subjective:   Malik Stanton was seen and examined today.  Appears stable, denies any chest pain or shortness of breath.  Pain in the left leg 5/10.  No acute issues overnight.  Objective:   Vitals:   02/19/18 2347 02/20/18 0437 02/20/18 0659 02/20/18 0803  BP:  (!) 149/70  (!) 145/80  Pulse:  78  91  Resp:  (!) 22    Temp: 99 F (37.2 C) 99.2 F (37.3 C)  99.6 F (37.6 C)  TempSrc: Oral Oral  Oral  SpO2:  100%  96%  Weight:      Height:   5\' 10"  (1.778 m)     Intake/Output  Summary (Last 24 hours) at 02/20/2018 1124 Last data filed at 02/20/2018 1000 Gross per 24 hour  Intake 1194 ml  Output 2855 ml  Net -1661 ml     Wt Readings from Last 3 Encounters:  02/19/18 56.5 kg     Exam   General: Alert and oriented to self NAD  Eyes:   HEENT:   Cardiovascular: S1 S2 auscultated,  Regular rate and rhythm. No pedal edema b/l  Respiratory: Clear to auscultation bilaterally  Gastrointestinal: Soft, nontender, nondistended, + bowel sounds  Ext: no pedal edema bilaterally  Neuro: moving all 4 extremities  Musculoskeletal: No digital cyanosis, clubbing  Skin: Left lower extremity dressing intact  Psych: alert and awake, dementia    Data Reviewed:  I have personally reviewed following labs and imaging studies  Micro Results No results found for this or any previous visit (from the past 240 hour(s)).  Radiology Reports Dg Chest 2 View  Result Date: 02/17/2018 CLINICAL DATA:  Wounds on left foot and left lower leg. Urinary incontinence. Chronic cough. Smoker. Hypertension. EXAM: CHEST - 2 VIEW COMPARISON:  10/15/2016 FINDINGS: Emphysematous changes in the lungs with scattered central interstitial pattern suggesting chronic bronchitis. Heart size and pulmonary vascularity are normal. No airspace disease or consolidation in the lungs. No blunting of costophrenic angles. No pneumothorax. Calcification of the aorta. Mediastinal contours appear intact. Degenerative changes in the spine. IMPRESSION: Emphysematous and chronic bronchitic changes in the lungs. No evidence of active pulmonary disease. Electronically Signed   By: Burman Nieves M.D.   On: 02/17/2018 23:16   Dg Tibia/fibula Left  Result Date: 02/17/2018 CLINICAL DATA:  Wounds on left foot and left lower leg. Urinary incontinence. Chronic cough. Smoker. Hypertension. EXAM: LEFT TIBIA AND FIBULA - 2 VIEW COMPARISON:  None. FINDINGS: Degenerative changes in the left knee with medial and lateral  compartment narrowing, small osteophyte formation, and cartilaginous calcifications. No evidence of acute fracture or dislocation involving the tibia or fibula. Cortical thickening along the posterior distal tibial metaphysis probably representing either old fracture deformity or possibly an exostosis. Ossification of the distal tibiofibular ligament. Vascular calcifications. IMPRESSION: Degenerative changes in the left knee. Old appearing deformity of the distal tibial metaphysis. No acute bony abnormalities. Electronically Signed   By: Burman Nieves M.D.   On: 02/17/2018 23:18   Dg Foot Complete Left  Result Date: 02/17/2018 CLINICAL DATA:  Wounds on left foot and left lower leg. Urinary incontinence. Chronic cough. Smoker. Hypertension. EXAM: LEFT FOOT - COMPLETE 3+ VIEW COMPARISON:  None. FINDINGS: Degenerative changes in the interphalangeal joints and first metatarsal-phalangeal joint. Mild degenerative changes in the intertarsal joints. No evidence of  acute fracture or dislocation of the left foot. No focal bone lesion or bone destruction. Soft tissues are unremarkable. IMPRESSION: Degenerative changes in the left foot. No acute bony abnormalities. Electronically Signed   By: Burman Nieves M.D.   On: 02/17/2018 23:19   US Abdomen Limited Ruq  Result Date: 02/18/2018 CLINICAL DATA:  Abnormal LFTs. EXAM: ULTRASOUND ABDOMEN LIMITED RIGHT UPPER QUADRANT COMPARISON:  None. FINDINGS: Gallbladder: Gallbladder is contracted. Gallbladder walls appear thickened, but this is likely accentuated by the gallbladder contraction. Small stones versus polyps at the gallbladder fundus. Common bile duct: Diameter: Within normal limits of 5-6 mm. Liver: No focal lesion identified. Within normal limits in parenchymal echogenicity. Portal vein is patent on color Doppler imaging with normal direction of blood flow towards the liver. IMPRESSION: 1. Gallbladder is contracted, despite being NPO for 6 hours. This may  indicate gallbladder dysfunction. Gallbladder walls are thickened, but this is likely accentuated by the gallbladder contraction. No pericholecystic fluid, sonographic Murphy's sign or other secondary signs of acute cholecystitis. Gallbladder wall thickening is nonspecific with differential considerations of acute or chronic cholecystitis, hypoproteinemia, CHF and reactive thickening secondary to adjacent liver disease. Would consider nuclear medicine HIDA scan to ensure normal gallbladder function and exclude cholecystitis. 2. Small polyps versus small stones at the gallbladder fundus. 3. Liver appears normal. 4. No bile duct dilatation. Electronically Signed   By: Bary Richard M.D.   On: 02/18/2018 10:02    Lab Data:  CBC: Recent Labs  Lab 02/17/18 2215 02/18/18 0504 02/19/18 0257 02/20/18 0425  WBC 12.1* 11.2* 11.2* 12.0*  NEUTROABS 9.5*  --   --   --   HGB 10.6* 10.1* 9.6* 9.7*  HCT 32.3* 30.8* 29.2* 29.4*  MCV 81.4 79.6* 80.2 81.0  PLT 353 401* 415* 491*   Basic Metabolic Panel: Recent Labs  Lab 02/17/18 2215 02/18/18 0504 02/18/18 1109 02/19/18 0257 02/20/18 0425  NA 140 138  --  140 138  K 2.7* 2.9* 3.5 4.1 3.5  CL 102 104  --  112* 109  CO2 27 24  --  23 20*  GLUCOSE 128* 115*  --  113* 103*  BUN 28* 21  --  9 6*  CREATININE 1.12 0.98  --  0.81 0.72  CALCIUM 8.6* 8.5*  --  8.3* 8.3*  MG 2.3  --   --   --   --    GFR: Estimated Creatinine Clearance: 72.6 mL/min (by C-G formula based on SCr of 0.72 mg/dL). Liver Function Tests: Recent Labs  Lab 02/17/18 2215 02/18/18 0504 02/19/18 0257 02/20/18 0425  AST 66* 66* 67* 53*  ALT 52* 50* 50* 45*  ALKPHOS 78 73 66 62  BILITOT 0.5 0.5 0.4 0.5  PROT 7.1 6.3* 6.1* 6.0*  ALBUMIN 2.9* 2.5* 2.2* 2.2*   No results for input(s): LIPASE, AMYLASE in the last 168 hours. No results for input(s): AMMONIA in the last 168 hours. Coagulation Profile: Recent Labs  Lab 02/17/18 2215 02/20/18 0425  INR 1.33 1.27   Cardiac  Enzymes: Recent Labs  Lab 02/18/18 1109 02/20/18 0425  CKTOTAL 1,047* 1,137*   BNP (last 3 results) No results for input(s): PROBNP in the last 8760 hours. HbA1C: No results for input(s): HGBA1C in the last 72 hours. CBG: No results for input(s): GLUCAP in the last 168 hours. Lipid Profile: No results for input(s): CHOL, HDL, LDLCALC, TRIG, CHOLHDL, LDLDIRECT in the last 72 hours. Thyroid Function Tests: No results for input(s): TSH, T4TOTAL, FREET4, T3FREE, THYROIDAB  in the last 72 hours. Anemia Panel: No results for input(s): VITAMINB12, FOLATE, FERRITIN, TIBC, IRON, RETICCTPCT in the last 72 hours. Urine analysis:    Component Value Date/Time   COLORURINE YELLOW 02/17/2018 2051   APPEARANCEUR CLEAR 02/17/2018 2051   LABSPEC 1.025 02/17/2018 2051   PHURINE 5.5 02/17/2018 2051   GLUCOSEU NEGATIVE 02/17/2018 2051   HGBUR NEGATIVE 02/17/2018 2051   BILIRUBINUR NEGATIVE 02/17/2018 2051   KETONESUR NEGATIVE 02/17/2018 2051   PROTEINUR NEGATIVE 02/17/2018 2051   NITRITE NEGATIVE 02/17/2018 2051   LEUKOCYTESUR NEGATIVE 02/17/2018 2051     Keyshawn Hellwig M.D. [facility] 02/20/2018, 11:24 AM  Pager: [phone] Between 7am to 7pm - call Pager - [phone]  After 7pm go to [email or web address] - password [email or web address]  Call night coverage person covering after 7pm

## 2018-02-20 NOTE — Progress Notes (Signed)
Called clinical pharmacist to consult about an order for Heparin IV drip after Pt underwent the procedure today. The pharmacist was aware that Heparin had been stopped since before procedure. She will evaluate and adjust  the Heparin order for Pt. Will monitor.  Filiberto Pinks, RN

## 2018-02-20 NOTE — Progress Notes (Signed)
  Progress Note    02/20/2018 11:38 AM * No surgery date entered *  Subjective: He is disoriented but calm without overnight issues  Vitals:   02/20/18 0437 02/20/18 0803  BP: (!) 149/70 (!) 145/80  Pulse: 78 91  Resp: (!) 22   Temp: 99.2 F (37.3 C) 99.6 F (37.6 C)  SpO2: 100% 96%    Physical Exam: Awake and alert Nonlabored respirations Palpable femoral pulses bilaterally Dressing on left foot is clean dry intact  CBC    Component Value Date/Time   WBC 12.0 (H) 02/20/2018 0425   RBC 3.63 (L) 02/20/2018 0425   HGB 9.7 (L) 02/20/2018 0425   HCT 29.4 (L) 02/20/2018 0425   PLT 491 (H) 02/20/2018 0425   MCV 81.0 02/20/2018 0425   MCH 26.7 02/20/2018 0425   MCHC 33.0 02/20/2018 0425   RDW 16.6 (H) 02/20/2018 0425   LYMPHSABS 1.7 02/17/2018 2215   MONOABS 0.5 02/17/2018 2215   EOSABS 0.2 02/17/2018 2215   BASOSABS 0.1 02/17/2018 2215    BMET    Component Value Date/Time   NA 138 02/20/2018 0425   K 3.5 02/20/2018 0425   CL 109 02/20/2018 0425   CO2 20 (L) 02/20/2018 0425   GLUCOSE 103 (H) 02/20/2018 0425   BUN 6 (L) 02/20/2018 0425   CREATININE 0.72 02/20/2018 0425   CALCIUM 8.3 (L) 02/20/2018 0425   GFRNONAA >60 02/20/2018 0425   GFRAA >60 02/20/2018 0425    INR    Component Value Date/Time   INR 1.27 02/20/2018 0425     Intake/Output Summary (Last 24 hours) at 02/20/2018 1138 Last data filed at 02/20/2018 1000 Gross per 24 hour  Intake 1194 ml  Output 2855 ml  Net -1661 ml     Assessment:  67 y.o. male is here with left foot wound.  He is stable dementia.  Plan: Aortogram with bilateral lower extremity runoff with possible intervention on the left today.  I discussed this with his granddaughter and consent has been signed.   [name] C. Randie Heinz, MD [facility] [place] Office: [phone] Pager: [phone]  02/20/2018 11:38 AM

## 2018-02-20 NOTE — Progress Notes (Signed)
ANTICOAGULATION CONSULT NOTE   Pharmacy Consult for heparin Indication: limb ischemia/thrombus  No Known Allergies  Patient Measurements: Height: 5\' 10"  (177.8 cm)(per pt.) Weight: 124 lb 9 oz (56.5 kg) IBW/kg (Calculated) : 73  Vital Signs: Temp: 99.6 F (37.6 C) (10/15 0803) Temp Source: Oral (10/15 0803) BP: 145/80 (10/15 0803) Pulse Rate: 91 (10/15 0803)  Labs: Recent Labs    02/17/18 2215  02/18/18 0504 02/18/18 1109 02/18/18 2231 02/19/18 0257 02/19/18 1424 02/19/18 2259 02/20/18 0425  HGB 10.6*  --  10.1*  --   --  9.6*  --   --  9.7*  HCT 32.3*  --  30.8*  --   --  29.2*  --   --  29.4*  PLT 353  --  401*  --   --  415*  --   --  491*  APTT  --    < > 43* 53* 54* 47* 44*  --   --   LABPROT 16.3*  --   --   --   --   --   --   --  15.8*  INR 1.33  --   --   --   --   --   --   --  1.27  HEPARINUNFRC  --    < > 0.96*  --   --  0.32 0.21* 0.31 0.26*  CREATININE 1.12  --  0.98  --   --  0.81  --   --  0.72  CKTOTAL  --   --   --  1,047*  --   --   --   --  1,137*   < > = values in this interval not displayed.     Assessment: 67 y.o. male with leg ischemia, on Eliquis PTA (last dose 10/12) on hold, and transitioned to heparin drip; Angiogram planned for 10/15.  Hep lvl slightly low at 0.26 on 1450 units/hr  Goal of Therapy:  Heparin level 0.3-0.7 units/ml Monitor platelets by anticoagulation protocol: Yes   Plan:  Increase heparin drip to 1550 units/hr - no need for confirmatory level Daily heparin level, CBC Monitor for s/sx of bleeding  Isaac Bliss, PharmD, BCPS, BCCCP Clinical Pharmacist [phone]  Please check AMION for all [facility] Pharmacy numbers  02/20/2018 9:38 AM

## 2018-02-21 ENCOUNTER — Telehealth: Payer: Self-pay | Admitting: Vascular Surgery

## 2018-02-21 ENCOUNTER — Encounter (HOSPITAL_COMMUNITY): Payer: Self-pay | Admitting: Vascular Surgery

## 2018-02-21 LAB — COMPREHENSIVE METABOLIC PANEL
ALBUMIN: 2.2 g/dL — AB (ref 3.5–5.0)
ALT: 36 U/L (ref 0–44)
AST: 46 U/L — AB (ref 15–41)
Alkaline Phosphatase: 65 U/L (ref 38–126)
Anion gap: 9 (ref 5–15)
BUN: 11 mg/dL (ref 8–23)
CHLORIDE: 109 mmol/L (ref 98–111)
CO2: 18 mmol/L — AB (ref 22–32)
Calcium: 8.3 mg/dL — ABNORMAL LOW (ref 8.9–10.3)
Creatinine, Ser: 1.01 mg/dL (ref 0.61–1.24)
GFR calc Af Amer: >60 mL/min (ref >60)
GFR calc non Af Amer: >60 mL/min (ref >60)
GLUCOSE: 111 mg/dL — AB (ref 70–99)
POTASSIUM: 3.7 mmol/L (ref 3.5–5.1)
Sodium: 136 mmol/L (ref 135–145)
Total Bilirubin: 0.8 mg/dL (ref 0.3–1.2)
Total Protein: 5.7 g/dL — ABNORMAL LOW (ref 6.5–8.1)

## 2018-02-21 LAB — CBC
HEMATOCRIT: 30.9 % — AB (ref 39.0–52.0)
Hemoglobin: 10 g/dL — ABNORMAL LOW (ref 13.0–17.0)
MCH: 26.2 pg (ref 26.0–34.0)
MCHC: 32.4 g/dL (ref 30.0–36.0)
MCV: 81.1 fL (ref 80.0–100.0)
NRBC: 0 % (ref 0.0–0.2)
PLATELETS: 575 10*3/uL — AB (ref 150–400)
RBC: 3.81 MIL/uL — AB (ref 4.22–5.81)
RDW: 16.9 % — AB (ref 11.5–15.5)
WBC: 14 10*3/uL — AB (ref 4.0–10.5)

## 2018-02-21 LAB — HEPARIN LEVEL (UNFRACTIONATED): Heparin Unfractionated: 0.28 IU/mL — ABNORMAL LOW (ref 0.30–0.70)

## 2018-02-21 MED ORDER — ENOXAPARIN SODIUM 40 MG/0.4ML ~~LOC~~ SOLN
40.0000 mg | SUBCUTANEOUS | Status: DC
  Administered 2018-02-21 – 2018-02-22 (×2): 40 mg via SUBCUTANEOUS
  Filled 2018-02-21 (×2): qty 0.4

## 2018-02-21 MED FILL — Heparin Sod (Porcine)-NaCl IV Soln 1000 Unit/500ML-0.9%: INTRAVENOUS | Qty: 500 | Status: CN

## 2018-02-21 MED FILL — Clopidogrel Bisulfate Tab 300 MG (Base Equiv): ORAL | Qty: 1 | Status: AC

## 2018-02-21 NOTE — Telephone Encounter (Signed)
-----   Message from Maeola Harman, MD sent at 02/21/2018 10:35 AM EDT ----- [name] [id number] 1950-05-12  F/u in 3-4 weeks with lle duplex and abi's with me or pa or np

## 2018-02-21 NOTE — Progress Notes (Signed)
PROGRESS NOTE  Malik Stanton  ZOX:[id number] DOB: 03-27-1951 DOA: 02/17/2018 PCP: Patient, No Pcp Per  Brief Narrative: Malik Stanton is a 67 y.o. 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 Angiogram was planned but not performed due to fever. Eliquis was started and the patient was discharged. Due to failure of wounds to improve, his granddaughter brought him to the ED. He was admitted, ABI showing critical limb ischemia on left (0.13), heparin IV started. Vascular surgery was consulted and performed aortogram with runoff and stenting of the left SFA and external iliac arteries on 10/15. He was also noted to have elevated CK and mildly elevated LFTs for which IV fluids were given and statin held.   Assessment & Plan: Principal Problem:   Critical lower limb ischemia Active Problems:   Hypokalemia  Critical left lower limb and severe right lower limb ischemia with poorly healing wounds: - WOC consulted for medial 9cm x 7cm dry nonviable tissue and lateral wounds consistent with MARSI without exudate and more proximal dry nonviable tissue. Vascular surgery is hoping to avoid amputation due to the significant functional impact that would have. Continue local wound care.  - s/p stenting of left SFA and external iliac artery 10/15. Will follow up with vascular surgery as outpatient per Dr. Randie Heinz for wound recheck and noninvasive vascular studies.  - No strong indication for anticoagulation, so will stop heparin and not restart eliquis. Continue plavix alone. - Follow UP creatinine in AM to monitor for CIN (slightly bumped today).  Fever: Possibly related to procedure, possibly related to wounds. Also considering hepatobiliary source. Has had stable leukocytosis. Blood  cultures NGTD from admission.  - Discussed with RN, would want to be notified for fever >100.51F at which time would draw blood cultures and possibly start empiric antibiotics depending on clinical status.   Abnormal liver function tests: Unclear etiology. Possibly a statin effect vs. hypoperfusion vs. biliary obstruction/infection. Acute hepatitis panel negative.  - Monitor vital signs closely. If fever recurs or patient develops abdominal pain/tenderness/postprandial symptoms would consider HIDA scan for further investigation of RUQ U/S showing contracted gallbladder but no evidence of cholecystitis and normal liver.  - Hold statin - Trend LFTs.   Mild rhabdomyolysis: Nontraumatic, unclear etiology, possibly ischemic.  - Continue IV fluids and recheck CK in AM. This does not seem to be causing renal injury.   Dehydration with lactic acidosis: Resolving.  Dementia: Limits history, will need to discuss plans of care with family.   Hypertension:  - Continue home medications  DVT prophylaxis: Lovenox Code Status: Full Family Communication: None at bedside Disposition Plan: Uncertain. PT/OT consulted. If stable over next 24 hours would be ready for discharge.  Consultants:   Vascular surgery  Wound care  Procedures:   02/20/2018 Dr. Randie Heinz:  Apolinar Junes C. Randie Heinz, MD 1.  Ultrasound-guided cannulation right common femoral artery 2.  Aortogram with bilateral lower extremity runoff 3.  Stent of left SFA with 5 x 150, 5 x 120, 5 x 80 mm innova 4.  Stent of left external iliac artery with 7 x 40 mm Innova 5.  minx device closure right common femoral  Antimicrobials:  None   Subjective: No pain, starting to eat breakfast and completely denies any abdominal pain , nausea, vomiting, diarrhea, weight loss. Didn't feel  febrile or chills. No other issues.   Objective: Vitals:   02/20/18 2150 02/21/18 0444 02/21/18 0826 02/21/18 1142  BP: 108/69 (!) 107/58 106/72 (!) 112/56  Pulse: (!) 110  94 100 85  Resp:  (!) 25 20 (!) 27  Temp:  99 F (37.2 C) 98.9 F (37.2 C) 100.2 F (37.9 C)  TempSrc:  Oral Oral Oral  SpO2:  94% 98% 100%  Weight:  56.9 kg    Height:        Intake/Output Summary (Last 24 hours) at 02/21/2018 1223 Last data filed at 02/21/2018 0504 Gross per 24 hour  Intake 1341.34 ml  Output 1750 ml  Net -408.66 ml   Filed Weights   02/18/18 0359 02/19/18 0403 02/21/18 0444  Weight: 58.1 kg 56.5 kg 56.9 kg    Gen: 67 y.o. male in no distress  Pulm: Non-labored breathing room air. Clear to auscultation bilaterally.  CV: Regular rate and rhythm. No murmur, rub, or gallop. No JVD, no pedal edema. GI: Abdomen soft, not tender, non-distended, with normoactive bowel sounds. No organomegaly or masses felt. Ext: Warm, no deformities. Dopplerable pulse at left PT, right PT. Cap refill present in bilateral legs. Feet cool to touch.  Skin: Left lower leg wounds not examined. dressing is c/d/i. Right femoral site hemostatic.  Neuro: Alert. No focal neurological deficits. Psych: Judgement and insight appear impaired. Mood & affect appropriate.   Data Reviewed: I have personally reviewed following labs and imaging studies  CBC: Recent Labs  Lab 02/17/18 2215 02/18/18 0504 02/19/18 0257 02/20/18 0425 02/21/18 0306  WBC 12.1* 11.2* 11.2* 12.0* 14.0*  NEUTROABS 9.5*  --   --   --   --   HGB 10.6* 10.1* 9.6* 9.7* 10.0*  HCT 32.3* 30.8* 29.2* 29.4* 30.9*  MCV 81.4 79.6* 80.2 81.0 81.1  PLT 353 401* 415* 491* 575*   Basic Metabolic Panel: Recent Labs  Lab 02/17/18 2215 02/18/18 0504 02/18/18 1109 02/19/18 0257 02/20/18 0425 02/21/18 0306  NA 140 138  --  140 138 136  K 2.7* 2.9* 3.5 4.1 3.5 3.7  CL 102 104  --  112* 109 109  CO2 27 24  --  23 20* 18*  GLUCOSE 128* 115*  --  113* 103* 111*  BUN 28* 21  --  9 6* 11  CREATININE 1.12 0.98  --  0.81 0.72 1.01  CALCIUM 8.6* 8.5*  --  8.3* 8.3* 8.3*  MG 2.3  --   --   --   --   --    GFR: Estimated  Creatinine Clearance: 57.9 mL/min (by C-G formula based on SCr of 1.01 mg/dL). Liver Function Tests: Recent Labs  Lab 02/17/18 2215 02/18/18 0504 02/19/18 0257 02/20/18 0425 02/21/18 0306  AST 66* 66* 67* 53* 46*  ALT 52* 50* 50* 45* 36  ALKPHOS 78 73 66 62 65  BILITOT 0.5 0.5 0.4 0.5 0.8  PROT 7.1 6.3* 6.1* 6.0* 5.7*  ALBUMIN 2.9* 2.5* 2.2* 2.2* 2.2*   No results for input(s): LIPASE, AMYLASE in the last 168 hours. No results for input(s): AMMONIA in the last 168 hours. Coagulation Profile: Recent Labs  Lab 02/17/18 2215 02/20/18 0425  INR 1.33 1.27   Cardiac Enzymes: Recent Labs  Lab 02/18/18 1109 02/20/18 0425  CKTOTAL 1,047* 1,137*   BNP (last 3 results) No results for input(s): PROBNP in the last 8760 hours. HbA1C: No results for input(s): HGBA1C in the last 72 hours. CBG: No results for input(s): GLUCAP  in the last 168 hours. Lipid Profile: No results for input(s): CHOL, HDL, LDLCALC, TRIG, CHOLHDL, LDLDIRECT in the last 72 hours. Thyroid Function Tests: No results for input(s): TSH, T4TOTAL, FREET4, T3FREE, THYROIDAB in the last 72 hours. Anemia Panel: No results for input(s): VITAMINB12, FOLATE, FERRITIN, TIBC, IRON, RETICCTPCT in the last 72 hours. Urine analysis:    Component Value Date/Time   COLORURINE YELLOW 02/17/2018 2051   APPEARANCEUR CLEAR 02/17/2018 2051   LABSPEC 1.025 02/17/2018 2051   PHURINE 5.5 02/17/2018 2051   GLUCOSEU NEGATIVE 02/17/2018 2051   HGBUR NEGATIVE 02/17/2018 2051   BILIRUBINUR NEGATIVE 02/17/2018 2051   KETONESUR NEGATIVE 02/17/2018 2051   PROTEINUR NEGATIVE 02/17/2018 2051   NITRITE NEGATIVE 02/17/2018 2051   LEUKOCYTESUR NEGATIVE 02/17/2018 2051   Recent Results (from the past 240 hour(s))  Culture, blood (Routine X 2) w Reflex to ID Panel     Status: None (Preliminary result)   Collection Time: 02/17/18 10:15 PM  Result Value Ref Range Status   Specimen Description   Final    BLOOD LEFT FOREARM Performed at [facility], [address] [place], [place] [address]    Special Requests   Final    BOTTLES DRAWN AEROBIC AND ANAEROBIC Blood Culture adequate volume Performed at [facility], [address] [place], [place] [address]    Culture   Final    NO GROWTH 3 DAYS Performed at [facility], [address] [place], [place] [address]    Report Status PENDING  Incomplete  Culture, blood (Routine X 2) w Reflex to ID Panel     Status: None (Preliminary result)   Collection Time: 02/17/18 10:16 PM  Result Value Ref Range Status   Specimen Description   Final    BLOOD LEFT ANTECUBITAL Performed at [facility], [address] [place], [place] [address]    Special Requests   Final    BOTTLES DRAWN AEROBIC AND ANAEROBIC Blood Culture adequate volume Performed at [facility], [address] [place], [place] [address]    Culture   Final    NO GROWTH 3 DAYS Performed at [facility], [address] [place], [place] [address]    Report Status PENDING  Incomplete      Radiology Studies: No results found.  Scheduled Meds: . amLODipine  10 mg Oral Daily  . clopidogrel  75 mg Oral Daily  . metoprolol tartrate  25 mg Oral BID  . sodium chloride flush  3 mL Intravenous Q12H   Continuous Infusions: . sodium chloride 75 mL/hr at 02/21/18 0843  . sodium chloride       LOS: 3 days   Time spent: 25 minutes.  Tyrone Nine, MD [facility] [email or web address] Password [email or web address] 02/21/2018, 12:23 PM

## 2018-02-21 NOTE — Progress Notes (Signed)
ANTICOAGULATION CONSULT NOTE   Pharmacy Consult for heparin Indication: limb ischemia/thrombus  No Known Allergies  Patient Measurements: Height: 5\' 10"  (177.8 cm)(per pt.) Weight: 125 lb 7.1 oz (56.9 kg) IBW/kg (Calculated) : 73  Vital Signs: Temp: 98.9 F (37.2 C) (10/16 0826) Temp Source: Oral (10/16 0826) BP: 106/72 (10/16 0826) Pulse Rate: 100 (10/16 0826)  Labs: Recent Labs    02/18/18 1109 02/18/18 2231  02/19/18 0257 02/19/18 1424 02/19/18 2259 02/20/18 0425 02/21/18 0306  HGB  --   --    < > 9.6*  --   --  9.7* 10.0*  HCT  --   --   --  29.2*  --   --  29.4* 30.9*  PLT  --   --   --  415*  --   --  491* 575*  APTT 53* 54*  --  47* 44*  --   --   --   LABPROT  --   --   --   --   --   --  15.8*  --   INR  --   --   --   --   --   --  1.27  --   HEPARINUNFRC  --   --    < > 0.32 0.21* 0.31 0.26* 0.28*  CREATININE  --   --   --  0.81  --   --  0.72 1.01  CKTOTAL 1,047*  --   --   --   --   --  1,137*  --    < > = values in this interval not displayed.     Assessment: 67 y.o. male with leg ischemia, on Eliquis PTA (last dose 10/12) on hold, and transitioned to heparin drip; underwent aortogram with stent placed in L SFA and external iliac artery. Heparin restarted 6 hours post procedure.   Heparin level slightly subtherapeutic (0.28) on gtt at 1550 units/hr. Hgb low but stable, plt elevated. No issues with line or bleeding reported per RN.  Goal of Therapy:  Heparin level 0.3-0.7 units/ml Monitor platelets by anticoagulation protocol: Yes   Plan:  Increase heparin infusion to 1700 units/hr  F/u 6 hr heparin level  Christoper Fabian, PharmD, BCPS Clinical pharmacist  **Pharmacist phone directory can now be found on [email or web address] ([email or web address]).  Listed under [facility]. 02/21/2018 8:31 AM

## 2018-02-21 NOTE — Progress Notes (Signed)
Pt had temperature 100.2 F. Notified Dr. Jarvis Newcomer had already aware . Will monitor.  Filiberto Pinks, RN

## 2018-02-21 NOTE — Care Management Important Message (Signed)
Important Message  Patient Details  Name: Malik Stanton MRN: [id number] Date of Birth: 04-Aug-1950   Medicare Important Message Given:  Yes    Malik Stanton Malik Stanton 02/21/2018, 12:51 PM

## 2018-02-21 NOTE — Telephone Encounter (Signed)
sch ppt vm full mld ltr 03/21/18 2pm ABI 3pm LE Art 330pm f/u PA

## 2018-02-21 NOTE — Progress Notes (Signed)
  Progress Note    02/21/2018 10:32 AM 1 Day Post-Op  Subjective: No new complaints  Vitals:   02/21/18 0444 02/21/18 0826  BP: (!) 107/58 106/72  Pulse: 94 100  Resp: (!) 25 20  Temp: 99 F (37.2 C) 98.9 F (37.2 C)  SpO2: 94% 98%    Physical Exam: Awake and alert Abdomen is soft Palpable right femoral pulse without hematoma Palpable left popliteal pulse Strong posterior tibial signal on the left Stable eschar left foot and ankle  CBC    Component Value Date/Time   WBC 14.0 (H) 02/21/2018 0306   RBC 3.81 (L) 02/21/2018 0306   HGB 10.0 (L) 02/21/2018 0306   HCT 30.9 (L) 02/21/2018 0306   PLT 575 (H) 02/21/2018 0306   MCV 81.1 02/21/2018 0306   MCH 26.2 02/21/2018 0306   MCHC 32.4 02/21/2018 0306   RDW 16.9 (H) 02/21/2018 0306   LYMPHSABS 1.7 02/17/2018 2215   MONOABS 0.5 02/17/2018 2215   EOSABS 0.2 02/17/2018 2215   BASOSABS 0.1 02/17/2018 2215    BMET    Component Value Date/Time   NA 136 02/21/2018 0306   K 3.7 02/21/2018 0306   CL 109 02/21/2018 0306   CO2 18 (L) 02/21/2018 0306   GLUCOSE 111 (H) 02/21/2018 0306   BUN 11 02/21/2018 0306   CREATININE 1.01 02/21/2018 0306   CALCIUM 8.3 (L) 02/21/2018 0306   GFRNONAA >60 02/21/2018 0306   GFRAA >60 02/21/2018 0306    INR    Component Value Date/Time   INR 1.27 02/20/2018 0425     Intake/Output Summary (Last 24 hours) at 02/21/2018 1032 Last data filed at 02/21/2018 0504 Gross per 24 hour  Intake 1341.34 ml  Output 1750 ml  Net -408.66 ml     Assessment:  67 y.o. male is s/p left lower extremity revascularization with stenting with eschar left ankle and foot  Plan: Continue Plavix Stable for discharge from vascular standpoint we will have him follow-up with noninvasive vascular studies and for wound check in a few weeks in the office.   Avery Eustice C. Randie Heinz, MD [facility] Office: [phone] Pager: [phone]  02/21/2018 10:32 AM

## 2018-02-22 LAB — CBC
HCT: 30.4 % — ABNORMAL LOW (ref 39.0–52.0)
Hemoglobin: 9.7 g/dL — ABNORMAL LOW (ref 13.0–17.0)
MCH: 26 pg (ref 26.0–34.0)
MCHC: 31.9 g/dL (ref 30.0–36.0)
MCV: 81.5 fL (ref 80.0–100.0)
NRBC: 0 % (ref 0.0–0.2)
PLATELETS: 606 10*3/uL — AB (ref 150–400)
RBC: 3.73 MIL/uL — AB (ref 4.22–5.81)
RDW: 16.9 % — ABNORMAL HIGH (ref 11.5–15.5)
WBC: 13.8 10*3/uL — ABNORMAL HIGH (ref 4.0–10.5)

## 2018-02-22 LAB — COMPREHENSIVE METABOLIC PANEL
ALK PHOS: 67 U/L (ref 38–126)
ALT: 31 U/L (ref 0–44)
ANION GAP: 10 (ref 5–15)
AST: 41 U/L (ref 15–41)
Albumin: 1.9 g/dL — ABNORMAL LOW (ref 3.5–5.0)
BILIRUBIN TOTAL: 0.6 mg/dL (ref 0.3–1.2)
BUN: 10 mg/dL (ref 8–23)
CALCIUM: 7.8 mg/dL — AB (ref 8.9–10.3)
CO2: 16 mmol/L — ABNORMAL LOW (ref 22–32)
Chloride: 109 mmol/L (ref 98–111)
Creatinine, Ser: 0.97 mg/dL (ref 0.61–1.24)
GFR calc non Af Amer: >60 mL/min (ref >60)
Glucose, Bld: 170 mg/dL — ABNORMAL HIGH (ref 70–99)
Potassium: 3.2 mmol/L — ABNORMAL LOW (ref 3.5–5.1)
Sodium: 135 mmol/L (ref 135–145)
TOTAL PROTEIN: 5.3 g/dL — AB (ref 6.5–8.1)

## 2018-02-22 LAB — CK: Total CK: 719 U/L — ABNORMAL HIGH (ref 49–397)

## 2018-02-22 MED ORDER — CLOPIDOGREL BISULFATE 75 MG PO TABS: 75.0000 mg | ORAL_TABLET | Freq: Every day | ORAL | 0 refills | Status: DC

## 2018-02-22 MED ORDER — SODIUM BICARBONATE 8.4 % IV SOLN
INTRAVENOUS | Status: DC
  Administered 2018-02-22: 09:00:00 via INTRAVENOUS
  Filled 2018-02-22 (×2): qty 100

## 2018-02-22 MED ORDER — CLOPIDOGREL BISULFATE 75 MG PO TABS: 75.0000 mg | ORAL_TABLET | Freq: Every day | ORAL | 0 refills | Status: AC

## 2018-02-22 MED ORDER — POTASSIUM CHLORIDE CRYS ER 20 MEQ PO TBCR
40.0000 meq | EXTENDED_RELEASE_TABLET | Freq: Once | ORAL | Status: AC
  Administered 2018-02-22: 40 meq via ORAL
  Filled 2018-02-22: qty 2

## 2018-02-22 NOTE — Discharge Summary (Signed)
Physician Discharge Summary  Malik Stanton HYQ:[id number] DOB: December 31, 1950 DOA: 02/17/2018  PCP: Patient, No Pcp Per  Admit date: 02/17/2018 Discharge date: 02/22/2018  Admitted From: Home Disposition: Home   Recommendations for Outpatient Follow-up:  1. Follow up with PCP in 1-2 weeks 2. Follow up with vascular surgery as scheduled. Continue plavix for at least 3 months at which point he could switch to aspirin. 3. Recheck CBC to evaluate leukocytosis and CMP to monitor electrolytes, renal function, LFTs.  Home Health: PT, OT, RN, aide Equipment/Devices: None new Discharge Condition: Stable CODE STATUS: Full Diet recommendation: Heart healthy  Brief/Interim Summary: Malik Stanton is a 67 y.o. 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 Angiogram was planned but not performed due to fever. Eliquis was started and the patient was discharged. Due to failure of wounds to improve, his granddaughter brought him to the ED. He was admitted, ABI showing critical limb ischemia on left (0.13), heparin IV started. Vascular surgery was consulted and performed aortogram with runoff and stenting of the left SFA and external iliac arteries on 10/15. He was also noted to have elevated CK and mildly elevated LFTs for which IV fluids were given and statin held. LFTs have normalized, CK reduced. He remains at his functional baseline per PT and OT.  Discharge Diagnoses:  Principal Problem:   Critical lower limb ischemia Active Problems:   Hypokalemia  Critical left lower limb and severe right lower limb ischemia with poorly healing wounds: - WOC consulted for medial 9cm x 7cm dry nonviable tissue and lateral wounds consistent with MARSI without exudate and more proximal dry  nonviable tissue. Vascular surgery is hoping to avoid amputation due to the significant functional impact that would have. Continue local wound care. Will get Home health RN to help train family in dressing changes.  - s/p stenting of left SFA and external iliac artery 10/15. Will follow up with vascular surgery as outpatient per Dr. Donzetta Matters for wound recheck and noninvasive vascular studies. Follow up is arranged.   - No strong indication for anticoagulation, so stopped heparin and did not restart eliquis. Continue plavix alone. - HE REMAINS AT HIGH RISK FOR LEFT TRANSTIBIAL AMPUTATION IN THE FUTURE.  Fever: Possibly related to procedure, possibly related to wounds. Has had stable leukocytosis since admission and no priors for comparison. Blood cultures have remained negative. No evidence of localized infection.   Abnormal liver function tests: Unclear etiology. Possibly a statin effect vs. hypoperfusion vs. biliary obstruction/infection. Acute hepatitis panel negative. Resolved with holding statin.  - continue holding statin.  - Patient has no clinical evidence of cholecystitis, but could consider HIDA scan for further investigation of RUQ U/S showing contracted gallbladder but no evidence of cholecystitis and normal liver.   Mild rhabdomyolysis: Nontraumatic, unclear etiology, possibly due to limb ischemia. Improved with IV fluids. No renal injury.  Dehydration with lactic acidosis: Resolved, taking good per oral intake.  Dementia: Limits history, will need to discuss plans of care with family.   Hypertension:  - Continue home medications (note redundant medications were stopped)  Discharge Instructions Discharge Instructions    Diet - low sodium heart healthy   Complete by:  As directed    Discharge instructions   Complete by:  As directed    You were admitted  for failure of the wounds on your leg to heal. This is suspected to be due to poor blood flow which is called critical limb  ischemia because it could potentially require amputation. Two of the arteries in the leg were stented to improve flow to the leg which is hopefully going to allow the wounds to heal. It is not a guarantee, but it is the best chance of avoiding amputation. You are at your baseline mental status and functional status and are stable for discharge home with the following recommendations:  - Take medications as below. Do NOT take the combination pill with amlodipine/atorvastatin or the combination pill with amlodipine/valsartan/HCTZ. You should only take amlodipine alone. - STOP taking eliquis. - START taking plavix daily. - Follow up with vascular surgery (appointment has been made)  - Seek medical attention right away if you develop a fever or the wounds become more red, painful, or you notice pus/strong odor.   Increase activity slowly   Complete by:  As directed      Allergies as of 02/22/2018   No Known Allergies     Medication List    STOP taking these medications   amlodipine-atorvastatin 10-10 MG tablet Commonly known as:  CADUET   amLODIPine-Valsartan-HCTZ 10-320-25 MG Tabs   apixaban Kit Commonly known as:  ELIQUIS     TAKE these medications   amLODipine 10 MG tablet Commonly known as:  NORVASC Take 10 mg by mouth daily.   clopidogrel 75 MG tablet Commonly known as:  PLAVIX Take 1 tablet (75 mg total) by mouth daily. Start taking on:  02/23/2018   metoprolol succinate 25 MG 24 hr tablet Commonly known as:  TOPROL-XL Take 25 mg by mouth daily.      Follow-up Information    [facility] On 03/21/2018.   Specialty:  Vascular Surgery Why:  Office will call you to arrange your appt (sent) Contact information: [place] [phone]         No Known Allergies  Consultations:  Vascular surgery  Procedures/Studies: Dg Chest 2 View  Result Date: 02/17/2018 CLINICAL DATA:  Wounds on left foot and  left lower leg. Urinary incontinence. Chronic cough. Smoker. Hypertension. EXAM: CHEST - 2 VIEW COMPARISON:  10/15/2016 FINDINGS: Emphysematous changes in the lungs with scattered central interstitial pattern suggesting chronic bronchitis. Heart size and pulmonary vascularity are normal. No airspace disease or consolidation in the lungs. No blunting of costophrenic angles. No pneumothorax. Calcification of the aorta. Mediastinal contours appear intact. Degenerative changes in the spine. IMPRESSION: Emphysematous and chronic bronchitic changes in the lungs. No evidence of active pulmonary disease. Electronically Signed   By: Lucienne Capers M.D.   On: 02/17/2018 23:16   Dg Tibia/fibula Left  Result Date: 02/17/2018 CLINICAL DATA:  Wounds on left foot and left lower leg. Urinary incontinence. Chronic cough. Smoker. Hypertension. EXAM: LEFT TIBIA AND FIBULA - 2 VIEW COMPARISON:  None. FINDINGS: Degenerative changes in the left knee with medial and lateral compartment narrowing, small osteophyte formation, and cartilaginous calcifications. No evidence of acute fracture or dislocation involving the tibia or fibula. Cortical thickening along the posterior distal tibial metaphysis probably representing either old fracture deformity or possibly an exostosis. Ossification of the distal tibiofibular ligament. Vascular calcifications. IMPRESSION: Degenerative changes in the left knee. Old appearing deformity of the distal tibial metaphysis. No acute bony abnormalities. Electronically Signed   By: Lucienne Capers M.D.   On: 02/17/2018 23:18   Dg Foot Complete  Left  Result Date: 02/17/2018 CLINICAL DATA:  Wounds on left foot and left lower leg. Urinary incontinence. Chronic cough. Smoker. Hypertension. EXAM: LEFT FOOT - COMPLETE 3+ VIEW COMPARISON:  None. FINDINGS: Degenerative changes in the interphalangeal joints and first metatarsal-phalangeal joint. Mild degenerative changes in the intertarsal joints. No evidence  of acute fracture or dislocation of the left foot. No focal bone lesion or bone destruction. Soft tissues are unremarkable. IMPRESSION: Degenerative changes in the left foot. No acute bony abnormalities. Electronically Signed   By: Lucienne Capers M.D.   On: 02/17/2018 23:19   US Abdomen Limited Ruq  Result Date: 02/18/2018 CLINICAL DATA:  Abnormal LFTs. EXAM: ULTRASOUND ABDOMEN LIMITED RIGHT UPPER QUADRANT COMPARISON:  None. FINDINGS: Gallbladder: Gallbladder is contracted. Gallbladder walls appear thickened, but this is likely accentuated by the gallbladder contraction. Small stones versus polyps at the gallbladder fundus. Common bile duct: Diameter: Within normal limits of 5-6 mm. Liver: No focal lesion identified. Within normal limits in parenchymal echogenicity. Portal vein is patent on color Doppler imaging with normal direction of blood flow towards the liver. IMPRESSION: 1. Gallbladder is contracted, despite being NPO for 6 hours. This may indicate gallbladder dysfunction. Gallbladder walls are thickened, but this is likely accentuated by the gallbladder contraction. No pericholecystic fluid, sonographic Murphy's sign or other secondary signs of acute cholecystitis. Gallbladder wall thickening is nonspecific with differential considerations of acute or chronic cholecystitis, hypoproteinemia, CHF and reactive thickening secondary to adjacent liver disease. Would consider nuclear medicine HIDA scan to ensure normal gallbladder function and exclude cholecystitis. 2. Small polyps versus small stones at the gallbladder fundus. 3. Liver appears normal. 4. No bile duct dilatation. Electronically Signed   By: Franki Cabot M.D.   On: 02/18/2018 10:02    02/20/2018 Dr. Donzetta Matters:  Erlene Quan C. Donzetta Matters, MD 1.Ultrasound-guided cannulation right common femoral artery 2.Aortogram with bilateral lower extremity runoff 3.Stent of left SFA with 5 x 150, 5 x 120, 5 x 80 mminnova 4.Stent of left external iliac  artery with 7 x 40 mm Innova 5.minx device closure right common femoral  Subjective: No pain, getting around with supervision level assistance, felt stable to discharge home. Discussed with his son who will arrange supervision at home in addition to home health arranged by care management.  Discharge Exam: Vitals:   02/22/18 0435 02/22/18 0843  BP: 111/81 114/69  Pulse: 97 98  Resp: (!) 23   Temp: 98.9 F (37.2 C)   SpO2: 98%    General: Pt is alert, awake, not in acute distress Cardiovascular: RRR, S1/S2 +, no rubs, no gallops Respiratory: CTA bilaterally, no wheezing, no rhonchi Abdominal: Soft, NT, ND, bowel sounds + Extremities: LLE with medial 9cm x 7cm eschar, proximal lateral eschar and lateral wounds consistent with MARSI without exudate. No odor or purulence. Dopplerable pulse at left PT, right PT. Cap refill present in bilateral toes.  Labs: BNP (last 3 results) No results for input(s): BNP in the last 8760 hours. Basic Metabolic Panel: Recent Labs  Lab 02/17/18 2215 02/18/18 0504 02/18/18 1109 02/19/18 0257 02/20/18 0425 02/21/18 0306 02/22/18 0404  NA 140 138  --  140 138 136 135  K 2.7* 2.9* 3.5 4.1 3.5 3.7 3.2*  CL 102 104  --  112* 109 109 109  CO2 27 24  --  23 20* 18* 16*  GLUCOSE 128* 115*  --  113* 103* 111* 170*  BUN 28* 21  --  9 6* 11 10  CREATININE 1.12 0.98  --  0.81 0.72 1.01 0.97  CALCIUM 8.6* 8.5*  --  8.3* 8.3* 8.3* 7.8*  MG 2.3  --   --   --   --   --   --    Liver Function Tests: Recent Labs  Lab 02/18/18 0504 02/19/18 0257 02/20/18 0425 02/21/18 0306 02/22/18 0404  AST 66* 67* 53* 46* 41  ALT 50* 50* 45* 36 31  ALKPHOS 73 66 62 65 67  BILITOT 0.5 0.4 0.5 0.8 0.6  PROT 6.3* 6.1* 6.0* 5.7* 5.3*  ALBUMIN 2.5* 2.2* 2.2* 2.2* 1.9*   No results for input(s): LIPASE, AMYLASE in the last 168 hours. No results for input(s): AMMONIA in the last 168 hours. CBC: Recent Labs  Lab 02/17/18 2215 02/18/18 0504 02/19/18 0257  02/20/18 0425 02/21/18 0306 02/22/18 0404  WBC 12.1* 11.2* 11.2* 12.0* 14.0* 13.8*  NEUTROABS 9.5*  --   --   --   --   --   HGB 10.6* 10.1* 9.6* 9.7* 10.0* 9.7*  HCT 32.3* 30.8* 29.2* 29.4* 30.9* 30.4*  MCV 81.4 79.6* 80.2 81.0 81.1 81.5  PLT 353 401* 415* 491* 575* 606*   Cardiac Enzymes: Recent Labs  Lab 02/18/18 1109 02/20/18 0425 02/22/18 0404  CKTOTAL 1,047* 1,137* 719*   BNP: Invalid input(s): POCBNP CBG: No results for input(s): GLUCAP in the last 168 hours. D-Dimer No results for input(s): DDIMER in the last 72 hours. Hgb A1c No results for input(s): HGBA1C in the last 72 hours. Lipid Profile No results for input(s): CHOL, HDL, LDLCALC, TRIG, CHOLHDL, LDLDIRECT in the last 72 hours. Thyroid function studies No results for input(s): TSH, T4TOTAL, T3FREE, THYROIDAB in the last 72 hours.  Invalid input(s): FREET3 Anemia work up No results for input(s): VITAMINB12, FOLATE, FERRITIN, TIBC, IRON, RETICCTPCT in the last 72 hours. Urinalysis    Component Value Date/Time   COLORURINE YELLOW 02/17/2018 2051   APPEARANCEUR CLEAR 02/17/2018 2051   LABSPEC 1.025 02/17/2018 2051   PHURINE 5.5 02/17/2018 2051   [place] 02/17/2018 2051   [place] NEGATIVE 02/17/2018 2051   [place] NEGATIVE 02/17/2018 2051   [place] NEGATIVE 02/17/2018 2051   PROTEINUR NEGATIVE 02/17/2018 2051   NITRITE NEGATIVE 02/17/2018 2051   LEUKOCYTESUR NEGATIVE 02/17/2018 2051    Microbiology Recent Results (from the past 240 hour(s))  Culture, blood (Routine X 2) w Reflex to ID Panel     Status: None (Preliminary result)   Collection Time: 02/17/18 10:15 PM  Result Value Ref Range Status   Specimen Description   Final    BLOOD LEFT FOREARM Performed at [facility], [address] [place], [place] [address]    Special Requests   Final    BOTTLES DRAWN AEROBIC AND ANAEROBIC Blood Culture adequate volume Performed at [facility], [address]  [place], [place] [address]    Culture   Final    NO GROWTH 4 DAYS Performed at [facility], [place] [address] [place], [place] [address]    Report Status PENDING  Incomplete  Culture, blood (Routine X 2) w Reflex to ID Panel     Status: None (Preliminary result)   Collection Time: 02/17/18 10:16 PM  Result Value Ref Range Status   Specimen Description   Final    BLOOD LEFT ANTECUBITAL Performed at [facility], [address] [place], [place] [address]    Special Requests   Final    BOTTLES DRAWN AEROBIC AND ANAEROBIC Blood Culture  adequate volume Performed at [facility], [address] [place], [place] [address]    Culture   Final    NO GROWTH 4 DAYS Performed at [facility], [place] [address], [place], [place] [address]    Report Status PENDING  Incomplete    Time coordinating discharge: Approximately 40 minutes  Patrecia Pour, MD  [facility] 02/22/2018, 12:42 PM Pager [phone]

## 2018-02-22 NOTE — Care Management Note (Signed)
Case Management Note  Patient Details  Name: Malik Stanton MRN: [id number] Date of Birth: [birth year]  Subjective/Objective:      Critical lower limb ischemia            Suann Larry (Son) Rapheal Masso (Granddaughter)    [phone]       S/p   Procedure Performed 10/15 1.  Ultrasound-guided cannulation right common femoral artery 2.  Aortogram with bilateral lower extremity runoff 3.  Stent of left SFA with 5 x 150, 5 x 120, 5 x 80 mm innova 4.  Stent of left external iliac artery with 7 x 40 mm Innova 5.  minx device closure right common femoral  Action/Plan: Transition to home with home health services to follow.  Granddaughter to provide transportation to home.   Expected Discharge Date:  02/22/18               Expected Discharge Plan:  Home w Home Health Services  In-House Referral:  NA  Discharge planning Services  CM Consult  Post Acute Care Choice:    Choice offered to:  [facility] POA / Guardian, Patient(granddaughter , Judithann Sauger)  DME Arranged:  N/A DME Agency:  NA  HH Arranged:  PT, RN, OT, Nurse's Aide HH Agency:  [facility]  Status of Service:  Completed, signed off  If discussed at Long Length of Stay Meetings, dates discussed:    Additional Comments:  Epifanio Lesches, RN 02/22/2018, 4:26 PM

## 2018-02-22 NOTE — Evaluation (Addendum)
Occupational Therapy Evaluation Patient Details Name: Malik Stanton MRN: [id number] DOB: [birth year] Today's Date: 02/22/2018    History of Present Illness 67 y.o. male with a history of dementia, severe PAD with poorly healing wounds, HTN with recent admission to [facility] 10/5-10/10 for critical limb ischemia (CTA showed occluded left SFA, high grade stenosis of left external iliac artery, and common iliac aneurysm and eccentric mural thrombus). Angiogram was planned but not performed due to fever. Eliquis was started and the patient was discharged. Due to failure of wounds to improve, his granddaughter brought him to the ED. He was admitted, ABI showing critical limb ischemia on left (0.13), heparin IV started. Vascular surgery was consulted and performed aortogram with runoff and stenting of the left SFA and external iliac arteries on 10/15. He was also noted to have elevated CK and mildly elevated LFTs for which IV fluids were given and statin held.    Clinical Impression   Pt admitted with the above diagnoses and presents with below problem list. Pt will benefit from continued acute OT to address the below listed deficits and maximize independence with basic ADLs prior to d/c home. PTA pt reports he lives alone, uses a walker, and family (he mentioned a niece and a granddaughter) help him with transportation to appointments and "sometimes" with grocery shopping and meal preparation. No family present during evaluation. Pt was supervision to min guard with LB ADLs and functional mobility (household distance this session). Suspect as far as basic ADLs go, pt is not too far from his baseline. Of note, pt's HR was 126 after mobility.       Follow Up Recommendations  Home health OT;Supervision - Intermittent    Equipment Recommendations  None recommended by OT    Recommendations for Other Services       Precautions / Restrictions Precautions Precautions: Fall;Other (comment)(watch  HR) Restrictions Weight Bearing Restrictions: No      Mobility Bed Mobility Overal bed mobility: Needs Assistance Bed Mobility: Supine to Sit     Supine to sit: Supervision;HOB elevated     General bed mobility comments: delayed initiation, verbal and tactile cues given  Transfers Overall transfer level: Needs assistance Equipment used: Rolling walker (2 wheeled) Transfers: Sit to/from Stand Sit to Stand: Supervision;Min guard         General transfer comment: from EOB to recliner. min guard on initial stand from EOB    Balance Overall balance assessment: Needs assistance Sitting-balance support: Feet supported Sitting balance-Leahy Scale: Good Sitting balance - Comments: able to don R sock sitting EOB with no overt LOB   Standing balance support: Bilateral upper extremity supported;Single extremity supported;During functional activity Standing balance-Leahy Scale: Poor Standing balance comment: able to hold rw with one hand to complete pericare in standing                           ADL either performed or assessed with clinical judgement   ADL Overall ADL's : Needs assistance/impaired Eating/Feeding: Set up;Sitting   Grooming: Set up;Sitting;Min guard;Standing Grooming Details (indicate cue type and reason): sit for longer grooming session Upper Body Bathing: Set up;Sitting   Lower Body Bathing: Min guard;Sit to/from stand   Upper Body Dressing : Set up;Sitting   Lower Body Dressing: Min guard;Sit to/from stand   Toilet Transfer: Min guard;Ambulation;RW   Toileting- Architect and Hygiene: Min guard;Sit to/from stand   Tub/ Shower Transfer: Tub transfer;Min guard;Shower seat;Ambulation;Rolling walker   Functional mobility  during ADLs: Supervision/safety;Rolling walker(house hold distance this session with HR at 126 at end) General ADL Comments: Pt completed bed mobility, pericare, grooming tasks, and household distance functional  mobility as detailed above.     Vision         Perception     Praxis      Pertinent Vitals/Pain Pain Assessment: No/denies pain     Hand Dominance     Extremity/Trunk Assessment Upper Extremity Assessment Upper Extremity Assessment: Generalized weakness   Lower Extremity Assessment Lower Extremity Assessment: Defer to PT evaluation       Communication Communication Communication: Other (comment)(delayed responses, suspect baseline)   Cognition Arousal/Alertness: Awake/alert Behavior During Therapy: Flat affect Overall Cognitive Status: History of cognitive impairments - at baseline                                     General Comments  HR 126 after walking household distance    Exercises     Shoulder Instructions      Home Living Family/patient expects to be discharged to:: Private residence Living Arrangements: Alone Available Help at Discharge: Family;Available PRN/intermittently(niece and grandaughter) Type of Home: House Home Access: Stairs to enter [employer] of Steps: threshold step   Home Layout: One level     Bathroom Shower/Tub: Chief Strategy Officer: Standard     Home Equipment: Environmental consultant - 2 wheels;Shower seat   Additional Comments: h/o obtained from pt, no family present      Prior Functioning/Environment Level of Independence: Independent with assistive device(s)        Comments: pt reports niece helps with grocerys and meals sometimes. family provides transportation to appointments        OT Problem List: Decreased activity tolerance;Impaired balance (sitting and/or standing);Decreased knowledge of use of DME or AE;Decreased knowledge of precautions;Pain;Cardiopulmonary status limiting activity;Decreased cognition      OT Treatment/Interventions: Self-care/ADL training;DME and/or AE instruction;Therapeutic activities;Patient/family education;Balance training;Cognitive remediation/compensation     OT Goals(Current goals can be found in the care plan section) Acute Rehab OT Goals Patient Stated Goal: not stated OT Goal Formulation: With patient Time For Goal Achievement: 03/08/18 Potential to Achieve Goals: Good ADL Goals Pt Will Perform Lower Body Bathing: with modified independence;sit to/from stand Pt Will Perform Lower Body Dressing: with modified independence;sit to/from stand Pt Will Transfer to Toilet: with modified independence;ambulating Pt Will Perform Toileting - Clothing Manipulation and hygiene: with modified independence;sit to/from stand Pt Will Perform Tub/Shower Transfer: Tub transfer;with modified independence;ambulating;shower seat  OT Frequency: Min 3X/week   Barriers to D/C:    No family present to confirm home setup and available assist.        Co-evaluation PT/OT/SLP Co-Evaluation/Treatment: Yes Reason for Co-Treatment: To address functional/ADL transfers;For patient/therapist safety   OT goals addressed during session: ADL's and self-care      AM-PAC PT "6 Clicks" Daily Activity     Outcome Measure Help from another person eating meals?: None Help from another person taking care of personal grooming?: None Help from another person toileting, which includes using toliet, bedpan, or urinal?: A Little Help from another person bathing (including washing, rinsing, drying)?: A Little Help from another person to put on and taking off regular upper body clothing?: None Help from another person to put on and taking off regular lower body clothing?: None 6 Click Score: 22   End of Session Equipment Utilized During Treatment: Gait  belt;Rolling walker Nurse Communication: Mobility status;Other (comment)(Pt feels warm to the touch.)  Activity Tolerance: Patient tolerated treatment well;Other (comment)(HR 126 ) Patient left: in chair;with call bell/phone within reach;with chair alarm set  OT Visit Diagnosis: Unsteadiness on feet (R26.81);Muscle weakness  (generalized) (M62.81);Other symptoms and signs involving cognitive function                Time: 1610-9604 OT Time Calculation (min): 26 min Charges:  OT General Charges $OT Visit: 1 Visit OT Evaluation $OT Eval Low Complexity: 1 Low  Raynald Kemp, OT Acute Rehabilitation Services Pager: [phone] Office: [phone]   Pilar Grammes 02/22/2018, 10:30 AM

## 2018-02-22 NOTE — Progress Notes (Signed)
  Progress Note    02/22/2018 12:32 PM 2 Days Post-Op  Subjective: No new complaints  Vitals:   02/22/18 0435 02/22/18 0843  BP: 111/81 114/69  Pulse: 97 98  Resp: (!) 23   Temp: 98.9 F (37.2 C)   SpO2: 98%     Physical Exam: Awake and alert Palpable left popliteal pulse Strong posterior tibial signal on the left Dressing clean dry and intact on left leg  CBC    Component Value Date/Time   WBC 13.8 (H) 02/22/2018 0404   RBC 3.73 (L) 02/22/2018 0404   HGB 9.7 (L) 02/22/2018 0404   HCT 30.4 (L) 02/22/2018 0404   PLT 606 (H) 02/22/2018 0404   MCV 81.5 02/22/2018 0404   MCH 26.0 02/22/2018 0404   MCHC 31.9 02/22/2018 0404   RDW 16.9 (H) 02/22/2018 0404   LYMPHSABS 1.7 02/17/2018 2215   MONOABS 0.5 02/17/2018 2215   EOSABS 0.2 02/17/2018 2215   BASOSABS 0.1 02/17/2018 2215    BMET    Component Value Date/Time   NA 135 02/22/2018 0404   K 3.2 (L) 02/22/2018 0404   CL 109 02/22/2018 0404   CO2 16 (L) 02/22/2018 0404   GLUCOSE 170 (H) 02/22/2018 0404   BUN 10 02/22/2018 0404   CREATININE 0.97 02/22/2018 0404   CALCIUM 7.8 (L) 02/22/2018 0404   GFRNONAA >60 02/22/2018 0404   GFRAA >60 02/22/2018 0404    INR    Component Value Date/Time   INR 1.27 02/20/2018 0425     Intake/Output Summary (Last 24 hours) at 02/22/2018 1232 Last data filed at 02/22/2018 0339 Gross per 24 hour  Intake 850 ml  Output 650 ml  Net 200 ml     Assessment:  67 y.o. 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 He remains at high risk for transtibial amputation in the near future.   Brandon C. Randie Heinz, MD [facility] [place] Office: [phone] Pager: [phone]  02/22/2018 12:32 PM

## 2018-02-22 NOTE — Progress Notes (Signed)
D/c instructions reviewed with pt and granddaughter Deidrick Rainey). Telemetry removed. IV removed, clean and intact. Granddaughter to escort home- refused staff escort for pt to the car.   Versie Starks, RN

## 2018-02-22 NOTE — Evaluation (Signed)
Physical Therapy Evaluation Patient Details Name: Malik Stanton MRN: [id number] DOB: 1951-03-08 Today's Date: 02/22/2018   History of Present Illness  Pt is a 67 y.o. male admitted 02/17/18 with poor healing LLE wounds, found to have critical limb ischemia. S/p aortogram with runoff and stenting of L SFA and external iliac arteries on 10/15. PMH includes dementia, HTN, severe PAD.    Clinical Impression  Pt presents with an overall decrease in functional mobility secondary to above. PTA, pt indep with ambulation and lives alone; family assists with transportation and some household tasks. Today, pt ambulating with RW at supervision-level. HR up to 126 with ambulation. Pt would benefit from continued acute PT services to maximize functional mobility and independence prior to d/c with HHPT services.     Follow Up Recommendations Home health PT;Supervision - Intermittent    Equipment Recommendations  None recommended by PT    Recommendations for Other Services       Precautions / Restrictions Precautions Precautions: Fall;Other (comment) Precaution Comments: Watch HR Restrictions Weight Bearing Restrictions: No      Mobility  Bed Mobility Overal bed mobility: Needs Assistance Bed Mobility: Supine to Sit     Supine to sit: Supervision;HOB elevated     General bed mobility comments: delayed initiation, verbal and tactile cues given  Transfers Overall transfer level: Needs assistance Equipment used: Rolling walker (2 wheeled) Transfers: Sit to/from Stand Sit to Stand: Supervision         General transfer comment: from EOB to recliner. min guard on initial stand from EOB  Ambulation/Gait Ambulation/Gait assistance: Supervision Gait Distance (Feet): 80 Feet Assistive device: Rolling walker (2 wheeled) Gait Pattern/deviations: Step-through pattern;Decreased stride length;Decreased weight shift to left;Trunk flexed Gait velocity: Decreased Gait velocity interpretation:  1.31 - 2.62 ft/sec, indicative of limited community ambulator General Gait Details: Stability improving with distance; supervision for safety. Cues to maintain closer proximity to [employer]    Modified Rankin (Stroke Patients Only)       Balance Overall balance assessment: Needs assistance Sitting-balance support: Feet supported Sitting balance-Leahy Scale: Good Sitting balance - Comments: able to don R sock sitting EOB with no overt LOB   Standing balance support: Bilateral upper extremity supported;Single extremity supported;During functional activity Standing balance-Leahy Scale: Poor Standing balance comment: Able to perform pericare while standing with single UE support                             Pertinent Vitals/Pain Pain Assessment: No/denies pain    Home Living Family/patient expects to be discharged to:: Private residence Living Arrangements: Alone Available Help at Discharge: Family;Available PRN/intermittently(Niece, granddaughter) Type of Home: House Home Access: Stairs to enter   [employer] of Steps: threshold step Home Layout: One level Home Equipment: Walker - 2 wheels;Shower seat Additional Comments: h/o obtained from pt, no family present    Prior Function Level of Independence: Independent with assistive device(s)         Comments: pt reports niece helps with groceries and meals sometimes. family provides transportation to appointments     Hand Dominance        Extremity/Trunk Assessment   Upper Extremity Assessment Upper Extremity Assessment: Generalized weakness    Lower Extremity Assessment Lower Extremity Assessment: Generalized weakness    Cervical / Trunk Assessment Cervical / Trunk Assessment: Kyphotic  Communication   Communication: Other (comment)(delayed responses, suspect  baseline)  Cognition Arousal/Alertness: Awake/alert Behavior During Therapy: Flat  affect Overall Cognitive Status: History of cognitive impairments - at baseline                                 General Comments: Slowed responses, follows majority of simple/one-step commands      General Comments General comments (skin integrity, edema, etc.): HR up to 126 after walking household distance    Exercises     Assessment/Plan    PT Assessment Patient needs continued PT services  PT Problem List Decreased strength;Decreased activity tolerance;Decreased balance;Decreased mobility;Decreased cognition;Decreased knowledge of use of DME;Impaired sensation       PT Treatment Interventions DME instruction;Gait training;Stair training;Functional mobility training;Therapeutic activities;Therapeutic exercise;Balance training;Patient/family education    PT Goals (Current goals can be found in the Care Plan section)  Acute Rehab PT Goals Patient Stated Goal: not stated; enjoys watching tv PT Goal Formulation: With patient Time For Goal Achievement: 03/08/18 Potential to Achieve Goals: Good    Frequency Min 3X/week   Barriers to discharge Decreased caregiver support      Co-evaluation PT/OT/SLP Co-Evaluation/Treatment: Yes Reason for Co-Treatment: To address functional/ADL transfers;Necessary to address cognition/behavior during functional activity PT goals addressed during session: Mobility/safety with mobility;Balance;Proper use of DME OT goals addressed during session: ADL's and self-care       AM-PAC PT "6 Clicks" Daily Activity  Outcome Measure Difficulty turning over in bed (including adjusting bedclothes, sheets and blankets)?: None Difficulty moving from lying on back to sitting on the side of the bed? : A Little Difficulty sitting down on and standing up from a chair with arms (e.g., wheelchair, bedside commode, etc,.)?: A Little Help needed moving to and from a bed to chair (including a wheelchair)?: A Little Help needed walking in hospital  room?: A Little Help needed climbing 3-5 steps with a railing? : A Little 6 Click Score: 19    End of Session Equipment Utilized During Treatment: Gait belt Activity Tolerance: Patient tolerated treatment well Patient left: in chair;with call bell/phone within reach;with chair alarm set Nurse Communication: Mobility status PT Visit Diagnosis: Other abnormalities of gait and mobility (R26.89)    Time: 1610-9604 PT Time Calculation (min) (ACUTE ONLY): 26 min   Charges:   PT Evaluation $PT Eval Moderate Complexity: 1 Mod         Ina Homes, PT, DPT Acute Rehabilitation Services  Pager [phone] Office [phone]  Malachy Chamber 02/22/2018, 10:56 AM

## 2018-02-23 LAB — CULTURE, BLOOD (ROUTINE X 2)
Culture: NO GROWTH
Culture: NO GROWTH
SPECIAL REQUESTS: ADEQUATE
Special Requests: ADEQUATE

## 2018-02-27 ENCOUNTER — Other Ambulatory Visit: Payer: Self-pay

## 2018-02-27 DIAGNOSIS — I70229 Atherosclerosis of native arteries of extremities with rest pain, unspecified extremity: Secondary | ICD-10-CM

## 2018-02-27 DIAGNOSIS — I998 Other disorder of circulatory system: Secondary | ICD-10-CM

## 2018-02-28 ENCOUNTER — Emergency Department (HOSPITAL_BASED_OUTPATIENT_CLINIC_OR_DEPARTMENT_OTHER): Payer: Medicare Other

## 2018-02-28 ENCOUNTER — Encounter (HOSPITAL_BASED_OUTPATIENT_CLINIC_OR_DEPARTMENT_OTHER): Payer: Self-pay | Admitting: *Deleted

## 2018-02-28 ENCOUNTER — Inpatient Hospital Stay (HOSPITAL_BASED_OUTPATIENT_CLINIC_OR_DEPARTMENT_OTHER)
Admission: EM | Admit: 2018-02-28 | Discharge: 2018-03-07 | DRG: 239 | Disposition: A | Discharge status: Skilled Nursing Facility | Payer: Medicare Other | Attending: Internal Medicine | Admitting: Internal Medicine

## 2018-02-28 ENCOUNTER — Other Ambulatory Visit: Payer: Self-pay

## 2018-02-28 DIAGNOSIS — Z681 Body mass index (BMI) 19 or less, adult: Secondary | ICD-10-CM

## 2018-02-28 DIAGNOSIS — I70229 Atherosclerosis of native arteries of extremities with rest pain, unspecified extremity: Secondary | ICD-10-CM

## 2018-02-28 DIAGNOSIS — D638 Anemia in other chronic diseases classified elsewhere: Secondary | ICD-10-CM | POA: Diagnosis present

## 2018-02-28 DIAGNOSIS — L97929 Non-pressure chronic ulcer of unspecified part of left lower leg with unspecified severity: Secondary | ICD-10-CM | POA: Diagnosis present

## 2018-02-28 DIAGNOSIS — Z7902 Long term (current) use of antithrombotics/antiplatelets: Secondary | ICD-10-CM

## 2018-02-28 DIAGNOSIS — F17210 Nicotine dependence, cigarettes, uncomplicated: Secondary | ICD-10-CM | POA: Diagnosis present

## 2018-02-28 DIAGNOSIS — D62 Acute posthemorrhagic anemia: Secondary | ICD-10-CM | POA: Diagnosis not present

## 2018-02-28 DIAGNOSIS — I998 Other disorder of circulatory system: Principal | ICD-10-CM

## 2018-02-28 DIAGNOSIS — D649 Anemia, unspecified: Secondary | ICD-10-CM | POA: Diagnosis present

## 2018-02-28 DIAGNOSIS — I10 Essential (primary) hypertension: Secondary | ICD-10-CM | POA: Diagnosis present

## 2018-02-28 DIAGNOSIS — E876 Hypokalemia: Secondary | ICD-10-CM | POA: Diagnosis present

## 2018-02-28 DIAGNOSIS — D72829 Elevated white blood cell count, unspecified: Secondary | ICD-10-CM | POA: Diagnosis present

## 2018-02-28 DIAGNOSIS — I96 Gangrene, not elsewhere classified: Secondary | ICD-10-CM

## 2018-02-28 DIAGNOSIS — I70202 Unspecified atherosclerosis of native arteries of extremities, left leg: Secondary | ICD-10-CM

## 2018-02-28 DIAGNOSIS — E43 Unspecified severe protein-calorie malnutrition: Secondary | ICD-10-CM | POA: Diagnosis present

## 2018-02-28 DIAGNOSIS — F0390 Unspecified dementia without behavioral disturbance: Secondary | ICD-10-CM | POA: Diagnosis present

## 2018-02-28 DIAGNOSIS — I70262 Atherosclerosis of native arteries of extremities with gangrene, left leg: Secondary | ICD-10-CM | POA: Diagnosis present

## 2018-02-28 DIAGNOSIS — D509 Iron deficiency anemia, unspecified: Secondary | ICD-10-CM | POA: Diagnosis present

## 2018-02-28 DIAGNOSIS — I739 Peripheral vascular disease, unspecified: Secondary | ICD-10-CM | POA: Diagnosis present

## 2018-02-28 DIAGNOSIS — E538 Deficiency of other specified B group vitamins: Secondary | ICD-10-CM | POA: Diagnosis present

## 2018-02-28 HISTORY — DX: Peripheral vascular disease, unspecified: I73.9

## 2018-02-28 LAB — CBC WITH DIFFERENTIAL/PLATELET
ABS IMMATURE GRANULOCYTES: 0.12 10*3/uL — AB (ref 0.00–0.07)
BASOS ABS: 0.1 10*3/uL (ref 0.0–0.1)
Basophils Relative: 0 %
EOS ABS: 0.2 10*3/uL (ref 0.0–0.5)
EOS PCT: 1 %
HEMATOCRIT: 30.7 % — AB (ref 39.0–52.0)
Hemoglobin: 9.9 g/dL — ABNORMAL LOW (ref 13.0–17.0)
Immature Granulocytes: 1 %
LYMPHS ABS: 2.9 10*3/uL (ref 0.7–4.0)
LYMPHS PCT: 18 %
MCH: 26.7 pg (ref 26.0–34.0)
MCHC: 32.2 g/dL (ref 30.0–36.0)
MCV: 82.7 fL (ref 80.0–100.0)
Monocytes Absolute: 1.1 10*3/uL — ABNORMAL HIGH (ref 0.1–1.0)
Monocytes Relative: 7 %
NRBC: 0 % (ref 0.0–0.2)
Neutro Abs: 11.6 10*3/uL — ABNORMAL HIGH (ref 1.7–7.7)
Neutrophils Relative %: 73 %
PLATELETS: 652 10*3/uL — AB (ref 150–400)
RBC: 3.71 MIL/uL — ABNORMAL LOW (ref 4.22–5.81)
RDW: 16.9 % — ABNORMAL HIGH (ref 11.5–15.5)
WBC: 16 10*3/uL — AB (ref 4.0–10.5)

## 2018-02-28 LAB — BASIC METABOLIC PANEL
ANION GAP: 11 (ref 5–15)
BUN: 11 mg/dL (ref 8–23)
CO2: 22 mmol/L (ref 22–32)
Calcium: 8.6 mg/dL — ABNORMAL LOW (ref 8.9–10.3)
Chloride: 101 mmol/L (ref 98–111)
Creatinine, Ser: 0.76 mg/dL (ref 0.61–1.24)
GFR calc Af Amer: >60 mL/min (ref >60)
GLUCOSE: 120 mg/dL — AB (ref 70–99)
POTASSIUM: 3.4 mmol/L — AB (ref 3.5–5.1)
Sodium: 134 mmol/L — ABNORMAL LOW (ref 135–145)

## 2018-02-28 LAB — CK: CK TOTAL: 59 U/L (ref 49–397)

## 2018-02-28 LAB — I-STAT CG4 LACTIC ACID, ED: Lactic Acid, Venous: 1.86 mmol/L (ref 0.5–1.9)

## 2018-02-28 MED ORDER — SODIUM CHLORIDE 0.9 % IV BOLUS
1000.0000 mL | Freq: Once | INTRAVENOUS | Status: AC
  Administered 2018-02-28: 1000 mL via INTRAVENOUS

## 2018-02-28 NOTE — ED Triage Notes (Addendum)
Brought in from home by EMS c/o left foot wound infection. Seen by V[facility]. Yesterday. Recent discharge from [facility] daughter [phone]

## 2018-02-28 NOTE — ED Provider Notes (Signed)
[facility] Provider Note: Malik Dell, MD, FACEP  CSN: [id number] MRN: [id number] ARRIVAL: 02/28/18 at 2052 ROOM: MH05/MH05   CHIEF COMPLAINT  Wound Infection  Level 5 caveat: Dementia HISTORY OF PRESENT ILLNESS  02/28/18 11:08 PM Malik Stanton is a 67 y.o. male with known peripheral vascular disease, critical on the left lower extremity and severe on the right lower extremity.  He has arterial Dopplers with ABIs pending.  He is followed by vascular surgery.  He was brought here by EMS because of worsening necrosis of his left lower ankle and foot.  No family member came with him.  Due to his dementia he has a limited ability to give a history.  He is able to deny significant pain associated with it.   Past Medical History:  Diagnosis Date  . Blood clot in vein 02/2018  . Dementia (HCC)   . Hypertension   . PVD (peripheral vascular disease) (HCC)     Past Surgical History:  Procedure Laterality Date  . ABDOMINAL AORTOGRAM W/LOWER EXTREMITY N/A 02/20/2018   Procedure: ABDOMINAL AORTOGRAM W/LOWER EXTREMITY;  Surgeon: Maeola Harman, MD;  Location: [facility] INVASIVE CV LAB;  Service: Cardiovascular;  Laterality: N/A;  bilateral  . OTHER SURGICAL HISTORY     "I was small when I had a surgery on my legs, arms and across my stomach" (02/20/2018)  . PERIPHERAL VASCULAR INTERVENTION Left 02/20/2018   lt. SFA, lt iliac stents  . PERIPHERAL VASCULAR INTERVENTION  02/20/2018   Procedure: PERIPHERAL VASCULAR INTERVENTION;  Surgeon: Maeola Harman, MD;  Location: [facility] INVASIVE CV LAB;  Service: Cardiovascular;;  lt. SFA, lt iliac stents    History reviewed. No pertinent family history.  Social History   Tobacco Use  . Smoking status: Current Every Day Smoker    Packs/day: 0.50    Years: 50.00    Pack years: 25.00    Types: Cigarettes  . Smokeless tobacco: Never Used  Substance Use Topics  . Alcohol use: Never    Frequency: Never  . Drug use: Not  Currently    Prior to Admission medications   Medication Sig Start Date End Date Taking? Authorizing Provider  amLODipine (NORVASC) 10 MG tablet Take 10 mg by mouth daily. 12/06/17   [provider]  clopidogrel (PLAVIX) 75 MG tablet Take 1 tablet (75 mg total) by mouth daily. 02/23/18   Tyrone Nine, MD  metoprolol succinate (TOPROL-XL) 25 MG 24 hr tablet Take 25 mg by mouth daily. 12/06/17   [provider]    Allergies Patient has no known allergies.   REVIEW OF SYSTEMS     PHYSICAL EXAMINATION  Initial Vital Signs Blood pressure 122/76, pulse (!) 126, temperature 99.4 F (37.4 C), temperature source Oral, resp. rate 20, height 5\' 10"  (1.778 m), weight 56.9 kg, SpO2 96 %.  Examination General: Well-developed, cachectic male in no acute distress; appears older than age of record HENT: normocephalic; atraumatic Eyes: pupils equal, round and reactive to light; extraocular muscles grossly intact; arcus senilis on the left Neck: supple Heart: regular rate and rhythm; tachycardia Lungs: clear to auscultation bilaterally Abdomen: soft; nondistended; nontender; bowel sounds present Extremities: No deformity; foul-smelling necrosis of skin of left ankle and foot with no palpable pulses and pallor of toes; no palpable or Dopplerable pulses of right foot:      Neurologic: Awake, alert; motor function intact in all extremities and symmetric; no facial droop Skin: Warm and dry Psychiatric: Flat affect   RESULTS  Summary of this visit's results, reviewed by myself:   EKG Interpretation  Date/Time:    Ventricular Rate:    PR Interval:    QRS Duration:   QT Interval:    QTC Calculation:   R Axis:     Text Interpretation:        Laboratory Studies: Results for orders placed or performed during the hospital encounter of 02/28/18 (from the past 24 hour(s))  CBC with Differential     Status: Abnormal   Collection Time: 02/28/18 11:19 PM  Result Value Ref  Range   WBC 16.0 (H) 4.0 - 10.5 K/uL   RBC 3.71 (L) 4.22 - 5.81 MIL/uL   Hemoglobin 9.9 (L) 13.0 - 17.0 g/dL   HCT 40.1 (L) 02.7 - 25.3 %   MCV 82.7 80.0 - 100.0 fL   MCH 26.7 26.0 - 34.0 pg   MCHC 32.2 30.0 - 36.0 g/dL   RDW 66.4 (H) 40.3 - 47.4 %   Platelets 652 (H) 150 - 400 K/uL   nRBC 0.0 0.0 - 0.2 %   Neutrophils Relative % 73 %   Neutro Abs 11.6 (H) 1.7 - 7.7 K/uL   Lymphocytes Relative 18 %   Lymphs Abs 2.9 0.7 - 4.0 K/uL   Monocytes Relative 7 %   Monocytes Absolute 1.1 (H) 0.1 - 1.0 K/uL   Eosinophils Relative 1 %   Eosinophils Absolute 0.2 0.0 - 0.5 K/uL   Basophils Relative 0 %   Basophils Absolute 0.1 0.0 - 0.1 K/uL   Immature Granulocytes 1 %   Abs Immature Granulocytes 0.12 (H) 0.00 - 0.07 K/uL  Basic metabolic panel     Status: Abnormal   Collection Time: 02/28/18 11:19 PM  Result Value Ref Range   Sodium 134 (L) 135 - 145 mmol/L   Potassium 3.4 (L) 3.5 - 5.1 mmol/L   Chloride 101 98 - 111 mmol/L   CO2 22 22 - 32 mmol/L   Glucose, Bld 120 (H) 70 - 99 mg/dL   BUN 11 8 - 23 mg/dL   Creatinine, Ser 2.59 0.61 - 1.24 mg/dL   Calcium 8.6 (L) 8.9 - 10.3 mg/dL   GFR calc non Af Amer >60 >60 mL/min   GFR calc Af Amer >60 >60 mL/min   Anion gap 11 5 - 15  CK     Status: None   Collection Time: 02/28/18 11:19 PM  Result Value Ref Range   Total CK 59 49 - 397 U/L  I-Stat CG4 Lactic Acid, ED     Status: None   Collection Time: 02/28/18 11:31 PM  Result Value Ref Range   Lactic Acid, Venous 1.86 0.5 - 1.9 mmol/L   Imaging Studies: Dg Tibia/fibula Left  Result Date: 03/01/2018 CLINICAL DATA:  Necrosis, known ischemia EXAM: LEFT TIBIA AND FIBULA - 2 VIEW COMPARISON:  None. FINDINGS: Degenerative changes in the left knee with joint space narrowing and spurring. No acute bony abnormality. Specifically, no fracture, subluxation, or dislocation. No radiographic changes of osteomyelitis. Soft tissues appear intact. IMPRESSION: No acute bony abnormality. No radiographic  changes of osteomyelitis. Electronically Signed   By: Charlett Nose M.D.   On: 03/01/2018 00:19   Dg Foot Complete Left  Result Date: 03/01/2018 CLINICAL DATA:  Necrosis, lower extremity ischemia EXAM: LEFT FOOT - COMPLETE 3+ VIEW COMPARISON:  None. FINDINGS: No acute bony abnormality. Specifically, no fracture, subluxation, or dislocation. No radiographic changes of osteomyelitis. Soft tissues are intact. IMPRESSION: No acute bony abnormality. Electronically Signed  By: Charlett Nose M.D.   On: 03/01/2018 00:20    ED COURSE and MDM  Nursing notes and initial vitals signs, including pulse oximetry, reviewed.  Vitals:   02/28/18 2103 02/28/18 2130  BP:  122/76  Pulse:  (!) 126  Resp:  20  Temp:  99.4 F (37.4 C)  TempSrc:  Oral  SpO2:  96%  Weight: 56.9 kg   Height: 5\' 10"  (1.778 m)    12:27 AM Clinically worsening necrosis of left lower leg and worsening of bilateral lower extremity ischemia.  No evidence of osteomyelitis, gas gangrene or rhabdomyolysis.  Antibiotics started for possible infection.  PROCEDURES    ED DIAGNOSES     ICD-10-CM   1. 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 Ischemia of both lower extremities I99.8        Payton Moder, Jonny Ruiz, MD 03/01/18 [phone]

## 2018-03-01 ENCOUNTER — Encounter (HOSPITAL_COMMUNITY): Payer: Self-pay

## 2018-03-01 DIAGNOSIS — D638 Anemia in other chronic diseases classified elsewhere: Secondary | ICD-10-CM | POA: Diagnosis present

## 2018-03-01 DIAGNOSIS — I739 Peripheral vascular disease, unspecified: Secondary | ICD-10-CM | POA: Diagnosis present

## 2018-03-01 DIAGNOSIS — I96 Gangrene, not elsewhere classified: Secondary | ICD-10-CM | POA: Diagnosis present

## 2018-03-01 DIAGNOSIS — D72829 Elevated white blood cell count, unspecified: Secondary | ICD-10-CM | POA: Diagnosis present

## 2018-03-01 DIAGNOSIS — E876 Hypokalemia: Secondary | ICD-10-CM | POA: Diagnosis present

## 2018-03-01 DIAGNOSIS — Z681 Body mass index (BMI) 19 or less, adult: Secondary | ICD-10-CM | POA: Diagnosis not present

## 2018-03-01 DIAGNOSIS — I70202 Unspecified atherosclerosis of native arteries of extremities, left leg: Secondary | ICD-10-CM

## 2018-03-01 DIAGNOSIS — I70262 Atherosclerosis of native arteries of extremities with gangrene, left leg: Secondary | ICD-10-CM | POA: Diagnosis present

## 2018-03-01 DIAGNOSIS — D649 Anemia, unspecified: Secondary | ICD-10-CM | POA: Diagnosis present

## 2018-03-01 DIAGNOSIS — L97929 Non-pressure chronic ulcer of unspecified part of left lower leg with unspecified severity: Secondary | ICD-10-CM | POA: Diagnosis present

## 2018-03-01 DIAGNOSIS — I10 Essential (primary) hypertension: Secondary | ICD-10-CM

## 2018-03-01 DIAGNOSIS — I998 Other disorder of circulatory system: Secondary | ICD-10-CM | POA: Diagnosis present

## 2018-03-01 DIAGNOSIS — D62 Acute posthemorrhagic anemia: Secondary | ICD-10-CM | POA: Diagnosis not present

## 2018-03-01 DIAGNOSIS — E538 Deficiency of other specified B group vitamins: Secondary | ICD-10-CM | POA: Diagnosis present

## 2018-03-01 DIAGNOSIS — D509 Iron deficiency anemia, unspecified: Secondary | ICD-10-CM | POA: Diagnosis present

## 2018-03-01 DIAGNOSIS — E43 Unspecified severe protein-calorie malnutrition: Secondary | ICD-10-CM | POA: Diagnosis present

## 2018-03-01 DIAGNOSIS — Z7902 Long term (current) use of antithrombotics/antiplatelets: Secondary | ICD-10-CM | POA: Diagnosis not present

## 2018-03-01 DIAGNOSIS — F17210 Nicotine dependence, cigarettes, uncomplicated: Secondary | ICD-10-CM | POA: Diagnosis present

## 2018-03-01 DIAGNOSIS — F0390 Unspecified dementia without behavioral disturbance: Secondary | ICD-10-CM | POA: Diagnosis present

## 2018-03-01 DIAGNOSIS — Z89512 Acquired absence of left leg below knee: Secondary | ICD-10-CM | POA: Diagnosis not present

## 2018-03-01 LAB — CBC
HEMATOCRIT: 25.2 % — AB (ref 39.0–52.0)
HEMOGLOBIN: 8.1 g/dL — AB (ref 13.0–17.0)
MCH: 26 pg (ref 26.0–34.0)
MCHC: 32.1 g/dL (ref 30.0–36.0)
MCV: 80.8 fL (ref 80.0–100.0)
Platelets: 562 10*3/uL — ABNORMAL HIGH (ref 150–400)
RBC: 3.12 MIL/uL — ABNORMAL LOW (ref 4.22–5.81)
RDW: 16.6 % — ABNORMAL HIGH (ref 11.5–15.5)
WBC: 13.3 10*3/uL — AB (ref 4.0–10.5)
nRBC: 0 % (ref 0.0–0.2)

## 2018-03-01 LAB — BASIC METABOLIC PANEL
ANION GAP: 5 (ref 5–15)
BUN: 5 mg/dL — ABNORMAL LOW (ref 8–23)
CHLORIDE: 109 mmol/L (ref 98–111)
CO2: 23 mmol/L (ref 22–32)
CREATININE: 0.65 mg/dL (ref 0.61–1.24)
Calcium: 8 mg/dL — ABNORMAL LOW (ref 8.9–10.3)
GFR calc non Af Amer: >60 mL/min (ref >60)
Glucose, Bld: 106 mg/dL — ABNORMAL HIGH (ref 70–99)
Potassium: 3.1 mmol/L — ABNORMAL LOW (ref 3.5–5.1)
Sodium: 137 mmol/L (ref 135–145)

## 2018-03-01 MED ORDER — PIPERACILLIN-TAZOBACTAM 3.375 G IVPB
3.3750 g | Freq: Three times a day (TID) | INTRAVENOUS | Status: DC
  Administered 2018-03-01 – 2018-03-04 (×9): 3.375 g via INTRAVENOUS
  Filled 2018-03-01 (×11): qty 50

## 2018-03-01 MED ORDER — POLYETHYLENE GLYCOL 3350 17 G PO PACK: 17.0000 g | PACK | Freq: Every day | ORAL | Status: DC | PRN

## 2018-03-01 MED ORDER — VANCOMYCIN HCL IN DEXTROSE 1-5 GM/200ML-% IV SOLN
1000.0000 mg | Freq: Once | INTRAVENOUS | Status: AC
  Administered 2018-03-01: 1000 mg via INTRAVENOUS
  Filled 2018-03-01: qty 200

## 2018-03-01 MED ORDER — ACETAMINOPHEN 325 MG PO TABS: 650.0000 mg | ORAL_TABLET | Freq: Four times a day (QID) | ORAL | Status: DC | PRN

## 2018-03-01 MED ORDER — POTASSIUM CHLORIDE CRYS ER 20 MEQ PO TBCR
40.0000 meq | EXTENDED_RELEASE_TABLET | Freq: Once | ORAL | Status: AC
  Administered 2018-03-01: 40 meq via ORAL

## 2018-03-01 MED ORDER — HEPARIN SODIUM (PORCINE) 5000 UNIT/ML IJ SOLN
5000.0000 [IU] | Freq: Three times a day (TID) | INTRAMUSCULAR | Status: DC
  Administered 2018-03-01 – 2018-03-03 (×4): 5000 [IU] via SUBCUTANEOUS
  Filled 2018-03-01 (×4): qty 1

## 2018-03-01 MED ORDER — HYDROCODONE-ACETAMINOPHEN 5-325 MG PO TABS
1.0000 | ORAL_TABLET | ORAL | Status: DC | PRN
  Administered 2018-03-01 – 2018-03-02 (×2): 2 via ORAL
  Administered 2018-03-04: 1 via ORAL
  Administered 2018-03-05 – 2018-03-07 (×3): 2 via ORAL
  Filled 2018-03-01 (×3): qty 2
  Filled 2018-03-01: qty 1
  Filled 2018-03-01 (×2): qty 2

## 2018-03-01 MED ORDER — PIPERACILLIN-TAZOBACTAM 3.375 G IVPB 30 MIN
3.3750 g | Freq: Once | INTRAVENOUS | Status: AC
  Administered 2018-03-01: 3.375 g via INTRAVENOUS
  Filled 2018-03-01 (×2): qty 50

## 2018-03-01 MED ORDER — ACETAMINOPHEN 650 MG RE SUPP: 650.0000 mg | Freq: Four times a day (QID) | RECTAL | Status: DC | PRN

## 2018-03-01 MED ORDER — ONDANSETRON HCL 4 MG/2ML IJ SOLN
4.0000 mg | Freq: Once | INTRAMUSCULAR | Status: AC
  Administered 2018-03-01: 4 mg via INTRAVENOUS
  Filled 2018-03-01: qty 2

## 2018-03-01 MED ORDER — HEPARIN BOLUS VIA INFUSION
3000.0000 [IU] | Freq: Once | INTRAVENOUS | Status: AC
  Administered 2018-03-01: 3000 [IU] via INTRAVENOUS
  Filled 2018-03-01: qty 3000

## 2018-03-01 MED ORDER — POTASSIUM CHLORIDE IN NACL 20-0.9 MEQ/L-% IV SOLN
INTRAVENOUS | Status: AC
  Administered 2018-03-01: 05:00:00 via INTRAVENOUS
  Filled 2018-03-01: qty 1000

## 2018-03-01 MED ORDER — CLOPIDOGREL BISULFATE 75 MG PO TABS
75.0000 mg | ORAL_TABLET | Freq: Every day | ORAL | Status: DC
  Administered 2018-03-01 – 2018-03-07 (×6): 75 mg via ORAL
  Filled 2018-03-01 (×6): qty 1

## 2018-03-01 MED ORDER — ONDANSETRON HCL 4 MG PO TABS: 4.0000 mg | ORAL_TABLET | Freq: Four times a day (QID) | ORAL | Status: DC | PRN

## 2018-03-01 MED ORDER — AMLODIPINE BESYLATE 10 MG PO TABS: 10.0000 mg | ORAL_TABLET | Freq: Every day | ORAL | Status: DC

## 2018-03-01 MED ORDER — FENTANYL CITRATE (PF) 100 MCG/2ML IJ SOLN
50.0000 ug | Freq: Once | INTRAMUSCULAR | Status: AC
  Administered 2018-03-01: 50 ug via INTRAVENOUS
  Filled 2018-03-01: qty 2

## 2018-03-01 MED ORDER — HEPARIN (PORCINE) IN NACL 100-0.45 UNIT/ML-% IJ SOLN
1300.0000 [IU]/h | INTRAMUSCULAR | Status: DC
  Administered 2018-03-01: 1300 [IU]/h via INTRAVENOUS
  Filled 2018-03-01 (×2): qty 250

## 2018-03-01 MED ORDER — METOPROLOL SUCCINATE ER 25 MG PO TB24
25.0000 mg | ORAL_TABLET | Freq: Every day | ORAL | Status: DC
  Administered 2018-03-01 – 2018-03-07 (×7): 25 mg via ORAL
  Filled 2018-03-01 (×7): qty 1

## 2018-03-01 MED ORDER — ONDANSETRON HCL 4 MG/2ML IJ SOLN: 4.0000 mg | Freq: Four times a day (QID) | INTRAMUSCULAR | Status: DC | PRN

## 2018-03-01 MED ORDER — VANCOMYCIN HCL IN DEXTROSE 750-5 MG/150ML-% IV SOLN
750.0000 mg | Freq: Two times a day (BID) | INTRAVENOUS | Status: DC
  Administered 2018-03-01 – 2018-03-04 (×7): 750 mg via INTRAVENOUS
  Filled 2018-03-01 (×7): qty 150

## 2018-03-01 NOTE — Progress Notes (Addendum)
   I have attempted to get consent multiple times via phone but was unable to today.  I will make him n.p.o. in case we can proceed with left below-knee amputation tomorrow.  He will otherwise need to be performed over the weekend or early next week.  Patient demonstrates limited understanding of this.  Malik Stanton C. Randie Heinz, MD [facility] [place] Office: [phone] Pager: [phone]  I have now spoken with the granddaughter Malik Stanton who gives consent for the patient.  We will plan for left below-knee amputation tomorrow in the operating room with Dr. Chestine Spore.  Malik Stanton C. Randie Heinz

## 2018-03-01 NOTE — Progress Notes (Signed)
Pharmacy Antibiotic Note  Malik Stanton is a 67 y.o. male admitted on 02/28/2018 with cellulitis.  Pharmacy has been consulted for Vancomycin/Zosyn dosing. Hx of the same. WBC elevated. Renal function OK.   Plan: Vancomycin 750 mg IV q12h Zosyn 3.375G IV q8h to be infused over 4 hours Trend WBC, temp, renal function  F/U infectious work-up Drug levels as indicated   Height: 5\' 10"  (177.8 cm) Weight: 125 lb 7.1 oz (56.9 kg) IBW/kg (Calculated) : 73  Temp (24hrs), Avg:99.3 F (37.4 C), Min:98.7 F (37.1 C), Max:99.7 F (37.6 C)  Recent Labs  Lab 02/28/18 2319 02/28/18 2331  WBC 16.0*  --   CREATININE 0.76  --   LATICACIDVEN  --  1.86    Estimated Creatinine Clearance: 73.1 mL/min (by C-G formula based on SCr of 0.76 mg/dL).    No Known Allergies  Abran Duke 03/01/2018 5:58 AM

## 2018-03-01 NOTE — Consult Note (Addendum)
Hospital Consult    Reason for Consult:  Progressive left leg wound Referring Physician: Dr. Sunnie Nielsen MRN #:  [id number]  History of Present Illness: This is a 67 y.o. male with history of dementia and peripheral arterial disease whom I placed stents in last week stenting from his left external iliac artery down through his SFA distally.  At that time he had a wound on his left ankle and foot.  He now has returned to the hospital with worsening of the wound.  He is conversant at bedside states that he does have some pain but is adamant that he does not want amputation at this time.  There is no family available at bedside.  Past Medical History:  Diagnosis Date  . Blood clot in vein 02/2018  . Dementia (HCC)   . Hypertension   . PVD (peripheral vascular disease) (HCC)     Past Surgical History:  Procedure Laterality Date  . ABDOMINAL AORTOGRAM W/LOWER EXTREMITY N/A 02/20/2018   Procedure: ABDOMINAL AORTOGRAM W/LOWER EXTREMITY;  Surgeon: Maeola Harman, MD;  Location: [facility] INVASIVE CV LAB;  Service: Cardiovascular;  Laterality: N/A;  bilateral  . OTHER SURGICAL HISTORY     "I was small when I had a surgery on my legs, arms and across my stomach" (02/20/2018)  . PERIPHERAL VASCULAR INTERVENTION Left 02/20/2018   lt. SFA, lt iliac stents  . PERIPHERAL VASCULAR INTERVENTION  02/20/2018   Procedure: PERIPHERAL VASCULAR INTERVENTION;  Surgeon: Maeola Harman, MD;  Location: [facility] INVASIVE CV LAB;  Service: Cardiovascular;;  lt. SFA, lt iliac stents    No Known Allergies  Prior to Admission medications   Medication Sig Start Date End Date Taking? Authorizing Provider  amLODIPine-Valsartan-HCTZ 10-320-25 MG TABS Take 1 tablet by mouth daily.   Yes [provider]  metoprolol succinate (TOPROL-XL) 25 MG 24 hr tablet Take 25 mg by mouth daily. 12/06/17  Yes [provider]  amLODipine (NORVASC) 10 MG tablet Take 10 mg by mouth daily. 12/06/17   [provider]  clopidogrel (PLAVIX) 75 MG tablet Take 1 tablet (75 mg total) by mouth daily. 02/23/18   Tyrone Nine, MD    Social History   Socioeconomic History  . Marital status: Divorced    Spouse name: Not on file  . Number of children: Not on file  . Years of education: Not on file  . Highest education level: Not on file  Occupational History  . Not on file  Social Needs  . Financial resource strain: Not on file  . Food insecurity:    Worry: Not on file    Inability: Not on file  . Transportation needs:    Medical: Not on file    Non-medical: Not on file  Tobacco Use  . Smoking status: Current Every Day Smoker    Packs/day: 0.50    Years: 50.00    Pack years: 25.00    Types: Cigarettes  . Smokeless tobacco: Never Used  Substance and Sexual Activity  . Alcohol use: Never    Frequency: Never  . Drug use: Not Currently  . Sexual activity: Not Currently  Lifestyle  . Physical activity:    Days per week: Not on file    Minutes per session: Not on file  . Stress: Not on file  Relationships  . Social connections:    Talks on phone: Not on file    Gets together: Not on file    Attends religious service: Not on file  Active member of club or organization: Not on file    Attends meetings of clubs or organizations: Not on file    Relationship status: Not on file  . Intimate partner violence:    Fear of current or ex partner: Not on file    Emotionally abused: Not on file    Physically abused: Not on file    Forced sexual activity: Not on file  Other Topics Concern  . Not on file  Social History Narrative  . Not on file     History reviewed. No pertinent family history.  ROS: Unable to adequately obtain given history of dementia no family present.   Physical Examination  Vitals:   03/01/18 0304 03/01/18 0843  BP: 129/80 135/78  Pulse: 97 88  Resp: (!) 22   Temp: 98.7 F (37.1 C) 99.3 F (37.4 C)  SpO2: 97% 99%   Body mass index is 18  kg/m.  General: No acute distress HENT: WNL, normocephalic Pulmonary: normal non-labored breathing Cardiac: Palpable femoral pulses bilaterally there is a palpable popliteal pulse in the left Abdomen: soft, NT/ND, no masses Extremities: Worsening eschar of left shin and ankle Neurologic: He is awake and alert  CBC    Component Value Date/Time   WBC 13.3 (H) 03/01/2018 0800   RBC 3.12 (L) 03/01/2018 0800   HGB 8.1 (L) 03/01/2018 0800   HCT 25.2 (L) 03/01/2018 0800   PLT 562 (H) 03/01/2018 0800   MCV 80.8 03/01/2018 0800   MCH 26.0 03/01/2018 0800   MCHC 32.1 03/01/2018 0800   RDW 16.6 (H) 03/01/2018 0800   LYMPHSABS 2.9 02/28/2018 2319   MONOABS 1.1 (H) 02/28/2018 2319   EOSABS 0.2 02/28/2018 2319   BASOSABS 0.1 02/28/2018 2319    BMET    Component Value Date/Time   NA 137 03/01/2018 0800   K 3.1 (L) 03/01/2018 0800   CL 109 03/01/2018 0800   CO2 23 03/01/2018 0800   GLUCOSE 106 (H) 03/01/2018 0800   BUN 5 (L) 03/01/2018 0800   CREATININE 0.65 03/01/2018 0800   CALCIUM 8.0 (L) 03/01/2018 0800   GFRNONAA >60 03/01/2018 0800   GFRAA >60 03/01/2018 0800    COAGS: Lab Results  Component Value Date   INR 1.27 02/20/2018   INR 1.33 02/17/2018     Non-Invasive Vascular Imaging:   No new studies  ASSESSMENT/PLAN: This is a 67 y.o. male left lower extremity endovascular revascularization for wound that has progressed despite improved blood flow.  Does have palpable popliteal pulse on the left.  I recommended left below-knee amputation and I have attempted to contact his family.  I will try again later today or tomorrow as he does not appear ill from this wound at this time this is not an emergency but will likely need to be done during this hospitalization when we can obtain consent.  Eligio Angert C. Randie Heinz, MD [facility] [place] Office: [phone] Pager: [phone]

## 2018-03-01 NOTE — Progress Notes (Signed)
Pt admitted to room 5N29 from [facility] ED s/p L lower leg and foot wound. Pt alert. Pt oriented to room. Vital signs stable.  Pt is a poor historian will complete admission when family is available.  Bed locked and in lowest position. Call bell is within reach. Urinal at bedside.

## 2018-03-01 NOTE — Plan of Care (Signed)
  Problem: Education: Goal: Knowledge of General Education information will improve Description Including pain rating scale, medication(s)/side effects and non-pharmacologic comfort measures Outcome: Progressing   Problem: Health Behavior/Discharge Planning: Goal: Ability to manage health-related needs will improve Outcome: Progressing   Problem: Clinical Measurements: Goal: Ability to maintain clinical measurements within normal limits will improve Outcome: Progressing Goal: Will remain free from infection Outcome: Progressing Goal: Diagnostic test results will improve Outcome: Progressing Goal: Respiratory complications will improve Outcome: Progressing Goal: Cardiovascular complication will be avoided Outcome: Progressing   Problem: Activity: Goal: Risk for activity intolerance will decrease Outcome: Progressing   Problem: Nutrition: Goal: Adequate nutrition will be maintained Outcome: Progressing   Problem: Coping: Goal: Level of anxiety will decrease Outcome: Progressing   Problem: Elimination: Goal: Will not experience complications related to bowel motility Outcome: Progressing Goal: Will not experience complications related to urinary retention Outcome: Progressing   Problem: Pain Managment: Goal: General experience of comfort will improve Outcome: Progressing   Problem: Safety: Goal: Ability to remain free from injury will improve Outcome: Progressing   Problem: Skin Integrity: Goal: Risk for impaired skin integrity will decrease Outcome: Progressing   Problem: Consults Goal: Venous Thromboembolism Patient Education Description See Patient Education Module for education specifics. Outcome: Progressing Goal: Diagnosis - Venous Thromboembolism (VTE) Description Choose a selection Outcome: Progressing Goal: Skin Care Protocol Initiated - if Braden Score 18 or less Description If consults are not indicated, leave blank or document N/A Outcome:  Progressing   Problem: Phase I Progression Outcomes Goal: Pain controlled with appropriate interventions Outcome: Progressing Goal: Tolerating diet Outcome: Progressing   Problem: Phase II Progression Outcomes Goal: Tolerating diet Outcome: Progressing   Problem: Discharge Progression Outcomes Goal: Barriers To Progression Addressed/Resolved Outcome: Progressing Goal: Discharge plan in place and appropriate Outcome: Progressing Goal: Pain controlled with appropriate interventions Outcome: Progressing Goal: Hemodynamically stable Outcome: Progressing Goal: Complications resolved/controlled Outcome: Progressing Goal: Activity appropriate for discharge plan Outcome: Progressing Goal: Other Discharge Outcomes/Goals Outcome: Progressing

## 2018-03-01 NOTE — Care Management (Signed)
This is a no charge note  Transfer from [facility] per Dr. Read Drivers  67 year old male with past medical history of his severe peripheral vascular disease, hypertension, dementia, tobacco abuse, recent admission from 10/12-10/7 due to critical left lower limb and severe right lower limb ischemia. Pt is s/p stenting of left SFA and external iliac artery 10/15. He is following up with vascular surgery as outpatient per Dr. Randie Heinz for wound recheck and noninvasive vascular studies. He has wound in left lower leg and foot. Today he presents with left foot wound infection. The left food turned black. Pulse is not palpable in left leg, and also not detectable by portable Doppler by ED physician.  Patient was found to have WBC 16.0, lactic acid of 1.86, potassium 3.4, creatinine normal, temperature 99.4, tachycardia, tachypnea, oxygen satting 96%.  X-ray of left foot and the left tibia/fibula negative for signs of osteomyelitis. Pt was started with vancomycin and Zosyn in ED. Patient is admitted to MedSurg bed as inpatient.  ED physician will consult [facility].   Please call manager of [facility] at [phone] when pt arrives to floor   Lorretta Harp, MD  [facility] Pager [phone]  If 7PM-7AM, please contact night-coverage [email or web address] Password [email or web address] 03/01/2018, 1:38 AM

## 2018-03-01 NOTE — H&P (Signed)
History and Physical    [name] ZOX:[id number] DOB: 1950/10/22 DOA: 02/28/2018  PCP: [facility]   Patient coming from: Home, by way of [facility]   Chief Complaint: Left leg ulcers worsening   HPI: Malik Stanton is a 67 y.o. male with medical history significant for dementia, hypertension, and peripheral arterial disease status post stent to the left external iliac artery and left SFA earlier this month, now presenting to the emergency department for evaluation of worsening ulcerations involving the distal left leg.  Patient's family reportedly became concerned with acute worsening and distal left leg wounds and sent patient to the ED.  He had been admitted earlier this month with critical limb ischemia and underwent successful stenting on 02/20/2018.  Family is not available at this time, but had reported to ED personnel that the wound had worsened acutely.  Patient denies pain at this time but has already been treated with fentanyl prior to exam.  Patient has no complaints.    [facility] ED Course: Upon arrival to the ED, patient is found to be afebrile, saturating well on room air, and with vitals otherwise stable.  Chemistry panel is notable for slight hyponatremia and hypokalemia.  CBC features and increased leukocytosis to 16,000, increasing thrombocytosis now 652,000, and a stable normocytic anemia with hemoglobin 9.9.  Lactic acid is reassuring at 1.86.  Radiographs are negative for gas or osteomyelitis.  Blood cultures were collected and patient was given a liter of normal saline, fentanyl, vancomycin, and Zosyn.  Vascular surgery was consulted by the ED physician and transfer to [facility] for admission was arranged.  Review of Systems:  Unable to complete ROS secondary to the patient's clinical condition.  Past Medical History:  Diagnosis Date  . Blood clot in vein 02/2018  . Dementia (HCC)   . Hypertension   . PVD  (peripheral vascular disease) (HCC)     Past Surgical History:  Procedure Laterality Date  . ABDOMINAL AORTOGRAM W/LOWER EXTREMITY N/A 02/20/2018   Procedure: ABDOMINAL AORTOGRAM W/LOWER EXTREMITY;  Surgeon: Maeola Harman, MD;  Location: [facility] INVASIVE CV LAB;  Service: Cardiovascular;  Laterality: N/A;  bilateral  . OTHER SURGICAL HISTORY     "I was small when I had a surgery on my legs, arms and across my stomach" (02/20/2018)  . PERIPHERAL VASCULAR INTERVENTION Left 02/20/2018   lt. SFA, lt iliac stents  . PERIPHERAL VASCULAR INTERVENTION  02/20/2018   Procedure: PERIPHERAL VASCULAR INTERVENTION;  Surgeon: Maeola Harman, MD;  Location: [facility] INVASIVE CV LAB;  Service: Cardiovascular;;  lt. SFA, lt iliac stents     reports that he has been smoking cigarettes. He has a 25.00 pack-year smoking history. He has never used smokeless tobacco. He reports that he has current or past drug history. He reports that he does not drink alcohol.  No Known Allergies  History reviewed. No pertinent family history.   Prior to Admission medications   Medication Sig Start Date End Date Taking? Authorizing Provider  amLODipine (NORVASC) 10 MG tablet Take 10 mg by mouth daily. 12/06/17   [provider]  clopidogrel (PLAVIX) 75 MG tablet Take 1 tablet (75 mg total) by mouth daily. 02/23/18   Tyrone Nine, MD  metoprolol succinate (TOPROL-XL) 25 MG 24 hr tablet Take 25 mg by mouth daily. 12/06/17   [provider]    Physical Exam: Vitals:   02/28/18 2103 02/28/18 2130 03/01/18 0046 03/01/18 0304  BP:  122/76 138/81 129/80  Pulse:  (!) 126 (!) 105 97  Resp:  20 12 (!) 22  Temp:  99.4 F (37.4 C) 99.7 F (37.6 C) 98.7 F (37.1 C)  TempSrc:  Oral  Oral  SpO2:  96% 97% 97%  Weight: 56.9 kg     Height: 5\' 10"  (1.778 m)       Constitutional: NAD, calm  Eyes: PERTLA, lids and conjunctivae normal ENMT: Mucous membranes are moist. Posterior pharynx clear of any  exudate or lesions.   Neck: normal, supple, no masses, no thyromegaly Respiratory: clear to auscultation bilaterally, no wheezing, no crackles. Normal respiratory effort.   Cardiovascular: S1 & S2 heard, regular rate and rhythm. No extremity edema. Distal left leg cool, no pulses palpable in LLE.   Abdomen: No distension, no tenderness, soft. Bowel sounds normal.  Musculoskeletal: no clubbing / cyanosis. No joint deformity upper and lower extremities.   Skin: Distal left leg darkened and cool with extensive ulcerations, foul odor, no drainage.  Neurologic: No facial asymmetry. Sensation absent in distal LLE. Moving all extremities.  Psychiatric: Alert and oriented to person and knows he is in a hospital, but not oriented to month or year. Pleasant and cooperative.    Labs on Admission: I have personally reviewed following labs and imaging studies  CBC: Recent Labs  Lab 02/28/18 2319  WBC 16.0*  NEUTROABS 11.6*  HGB 9.9*  HCT 30.7*  MCV 82.7  PLT 652*   Basic Metabolic Panel: Recent Labs  Lab 02/28/18 2319  NA 134*  K 3.4*  CL 101  CO2 22  GLUCOSE 120*  BUN 11  CREATININE 0.76  CALCIUM 8.6*   GFR: Estimated Creatinine Clearance: 73.1 mL/min (by C-G formula based on SCr of 0.76 mg/dL). Liver Function Tests: No results for input(s): AST, ALT, ALKPHOS, BILITOT, PROT, ALBUMIN in the last 168 hours. No results for input(s): LIPASE, AMYLASE in the last 168 hours. No results for input(s): AMMONIA in the last 168 hours. Coagulation Profile: No results for input(s): INR, PROTIME in the last 168 hours. Cardiac Enzymes: Recent Labs  Lab 02/28/18 2319  CKTOTAL 59   BNP (last 3 results) No results for input(s): PROBNP in the last 8760 hours. HbA1C: No results for input(s): HGBA1C in the last 72 hours. CBG: No results for input(s): GLUCAP in the last 168 hours. Lipid Profile: No results for input(s): CHOL, HDL, LDLCALC, TRIG, CHOLHDL, LDLDIRECT in the last 72  hours. Thyroid Function Tests: No results for input(s): TSH, T4TOTAL, FREET4, T3FREE, THYROIDAB in the last 72 hours. Anemia Panel: No results for input(s): VITAMINB12, FOLATE, FERRITIN, TIBC, IRON, RETICCTPCT in the last 72 hours. Urine analysis:    Component Value Date/Time   COLORURINE YELLOW 02/17/2018 2051   APPEARANCEUR CLEAR 02/17/2018 2051   LABSPEC 1.025 02/17/2018 2051   PHURINE 5.5 02/17/2018 2051   GLUCOSEU NEGATIVE 02/17/2018 2051   HGBUR NEGATIVE 02/17/2018 2051   BILIRUBINUR NEGATIVE 02/17/2018 2051   KETONESUR NEGATIVE 02/17/2018 2051   PROTEINUR NEGATIVE 02/17/2018 2051   NITRITE NEGATIVE 02/17/2018 2051   LEUKOCYTESUR NEGATIVE 02/17/2018 2051   Sepsis Labs: @LABRCNTIP (procalcitonin:4,lacticidven:4) )No results found for this or any previous visit (from the past 240 hour(s)).   Radiological Exams on Admission: Dg Tibia/fibula Left  Result Date: 03/01/2018 CLINICAL DATA:  Necrosis, known ischemia EXAM: LEFT TIBIA AND FIBULA - 2 VIEW COMPARISON:  None. FINDINGS: Degenerative changes in the left knee with joint space narrowing and spurring. No acute bony abnormality. Specifically, no fracture, subluxation, or  dislocation. No radiographic changes of osteomyelitis. Soft tissues appear intact. IMPRESSION: No acute bony abnormality. No radiographic changes of osteomyelitis. Electronically Signed   By: Charlett Nose M.D.   On: 03/01/2018 00:19   Dg Foot Complete Left  Result Date: 03/01/2018 CLINICAL DATA:  Necrosis, lower extremity ischemia EXAM: LEFT FOOT - COMPLETE 3+ VIEW COMPARISON:  None. FINDINGS: No acute bony abnormality. Specifically, no fracture, subluxation, or dislocation. No radiographic changes of osteomyelitis. Soft tissues are intact. IMPRESSION: No acute bony abnormality. Electronically Signed   By: Charlett Nose M.D.   On: 03/01/2018 00:20    EKG: Not performed.   Assessment/Plan   1. Critical ischemic of left leg; PAD  - Presents with acute  worsening distal LLE ulcers, foot was cool and no pulse detected with doppler in ED  - He was treated with stent to left external iliac artery and SFA on 02/20/18 and had strong PT signal on left following that  - ED physician consulting vascular, their involvement much appreciated  - Continue Plavix, start IV heparin infusion, continue pain-control   2. Gangrene lower left leg  - Large distal LLE ulcerations have progressed in size per family report and there is foul odor, increasing leukocytosis  - Radiographs negative for underlying gas or osteomyelitis  - Cultured in ED and started on vancomycin and Zosyn  - Continue current antibiotics, follow cultures    3. Hypokalemia  - Serum potassium 3.4  - KCl added to IVF   4. Anemia  - Hgb is 9.9 on admission, similar to priors  - No active bleeding, likely secondary to chronic disease, follow closely while starting IV heparin    5. Hypertension  - BP at goal  - Continue Norvasc and Toprol as tolerated    DVT prophylaxis: IV heparin infusion  Code Status: Full  Family Communication: Discussed with patient  Consults called: Vascular surgery consulted by ED physician  Admission status: Inpatient     Briscoe Deutscher, MD [facility] Pager [phone]  If 7PM-7AM, please contact night-coverage [email or web address] Password [email or web address]  03/01/2018, 4:50 AM

## 2018-03-01 NOTE — ED Notes (Signed)
Report given to Ebony,RN on [facility]

## 2018-03-01 NOTE — Progress Notes (Signed)
ANTICOAGULATION CONSULT NOTE - Initial Consult  Pharmacy Consult for Heparin  Indication: Ischemic leg  No Known Allergies  Patient Measurements: Height: 5\' 10"  (177.8 cm) Weight: 125 lb 7.1 oz (56.9 kg) IBW/kg (Calculated) : 73  Vital Signs: Temp: 98.7 F (37.1 C) (10/24 0304) Temp Source: Oral (10/24 0304) BP: 129/80 (10/24 0304) Pulse Rate: 97 (10/24 0304)  Labs: Recent Labs    02/28/18 2319  HGB 9.9*  HCT 30.7*  PLT 652*  CREATININE 0.76  CKTOTAL 59    Estimated Creatinine Clearance: 73.1 mL/min (by C-G formula based on SCr of 0.76 mg/dL).   Medical History: Past Medical History:  Diagnosis Date  . Blood clot in vein 02/2018  . Dementia (HCC)   . Hypertension   . PVD (peripheral vascular disease) (HCC)     Assessment: 67 y/o M with ?worsening ischemic leg, recent vascular procedure, starting heparin per pharmacy, Hgb 9.9, no bleeding noted, renal function good, PTA meds reviewed. Was previously on Apixaban, but this was stopped last admission.   Goal of Therapy:  Heparin level 0.3-0.7 units/ml Monitor platelets by anticoagulation protocol: Yes   Plan:  Heparin 3000 units BOLUS Start heparin drip at 1300 units/hr (starting at higher rate due to previous heparin needs) 1500 HL Daily CBC/HL Monitor for bleeding   Abran Duke 03/01/2018,6:10 AM

## 2018-03-01 NOTE — Progress Notes (Addendum)
PROGRESS NOTE    Malik Stanton  QIO:[id number] DOB: January 29, 1951 DOA: 02/28/2018 PCP: [facility]    Brief Narrative: Malik Stanton is a 67 y.o. male with medical history significant for dementia, hypertension, and peripheral arterial disease status post stent to the left external iliac artery and left SFA earlier this month, now presenting to the emergency department for evaluation of worsening ulcerations involving the distal left leg.  Patient's family reportedly became concerned with acute worsening and distal left leg wounds and sent patient to the ED.  He had been admitted earlier this month with critical limb ischemia and underwent successful stenting on 02/20/2018.  Family is not available at this time, but had reported to ED personnel that the wound had worsened acutely.  Patient denies pain at this time but has already been treated with fentanyl prior to exam.  Patient has no complaints.    [facility] ED Course: Upon arrival to the ED, patient is found to be afebrile, saturating well on room air, and with vitals otherwise stable.  Chemistry panel is notable for slight hyponatremia and hypokalemia.  CBC features and increased leukocytosis to 16,000, increasing thrombocytosis now 652,000, and a stable normocytic anemia with hemoglobin 9.9.  Lactic acid is reassuring at 1.86.  Radiographs are negative for gas or osteomyelitis.  Blood cultures were collected and patient was given a liter of normal saline, fentanyl, vancomycin, and Zosyn.  Vascular surgery was consulted by the ED physician and transfer to [facility] for admission was arranged.  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 In the ED no pulse detected with doppler.  Patient was started on IV antibiotics and IV heparin.  Pain management.  NPO until vascular evaluation.  I have called vascular this morning. Dr Randie Heinz.  Discussed with Dr Randie Heinz, patient needs amputation. He will speak with family. Also patient doesn't need IV heparin.   2-Gangrene lower extremity;  Large LLE ulceration, necrosis, progress in size per family.  Started on IV antibiotics.  Vascular consulted.   3-Hypokalemia;  Labs pending for the morning.   IV fluid with KCL.   Anemia;  Monitor hb on heparin.  Check anemia panel.   HTN; continue with Toprol. Hold Norvasc.  Leukocytosis; in setting of infection.  IV antibiotics.  Follow blood culture,.   DVT prophylaxis: heparin  Code Status: full code.  Family Communication: no family at bedside.  Disposition Plan: remain inpatient for IV anticoagulation and treatment of critical limb ischemia.   Consultants:   Dr Randie Heinz.    Procedures:   none   Antimicrobials:  Vancomycin   Zosyn,    Subjective: He is alert, denies dyspnea. He relates LE wound for last 2 weeks, getting worse.   Objective: Vitals:   02/28/18 2103 02/28/18 2130 03/01/18 0046 03/01/18 0304  BP:  122/76 138/81 129/80  Pulse:  (!) 126 (!) 105 97  Resp:  20 12 (!) 22  Temp:  99.4 F (37.4 C) 99.7 F (37.6 C) 98.7 F (37.1 C)  TempSrc:  Oral  Oral  SpO2:  96% 97% 97%  Weight: 56.9 kg     Height: 5'  10" (1.778 m)       Intake/Output Summary (Last 24 hours) at 03/01/2018 0752 Last data filed at 03/01/2018 0531 Gross per 24 hour  Intake 434.53 ml  Output 200 ml  Net 234.53 ml   Filed Weights   02/28/18 2103  Weight: 56.9 kg    Examination:  General exam: Appears calm and comfortable  Respiratory system: Clear to auscultation. Respiratory effort normal. Cardiovascular system: S1 & S2 heard, RRR. No JVD, murmurs, rubs, gallops or  clicks. No pedal edema. Gastrointestinal system: Abdomen is nondistended, soft and nontender. No organomegaly or masses felt. Normal bowel sounds heard. Central nervous system: Alert and oriented. No focal neurological deficits. Extremities: Symmetric 5 x 5 power. Skin: left LE with wound, necrotic tissue lower part of leg.      Data Reviewed: I have personally reviewed following labs and imaging studies  CBC: Recent Labs  Lab 02/28/18 2319  WBC 16.0*  NEUTROABS 11.6*  HGB 9.9*  HCT 30.7*  MCV 82.7  PLT 652*   Basic Metabolic Panel: Recent Labs  Lab 02/28/18 2319  NA 134*  K 3.4*  CL 101  CO2 22  GLUCOSE 120*  BUN 11  CREATININE 0.76  CALCIUM 8.6*   GFR: Estimated Creatinine Clearance: 73.1 mL/min (by C-G formula based on SCr of 0.76 mg/dL). Liver Function Tests: No results for input(s): AST, ALT, ALKPHOS, BILITOT, PROT, ALBUMIN in the last 168 hours. No results for input(s): LIPASE, AMYLASE in the last 168 hours. No results for input(s): AMMONIA in the last 168 hours. Coagulation Profile: No results for input(s): INR, PROTIME in the last 168 hours. Cardiac Enzymes: Recent Labs  Lab 02/28/18 2319  CKTOTAL 59   BNP (last 3 results) No results for input(s): PROBNP in the last 8760 hours. HbA1C: No results for input(s): HGBA1C in the last 72 hours. CBG: No results for input(s): GLUCAP in the last 168 hours. Lipid Profile: No results for input(s): CHOL, HDL, LDLCALC, TRIG, CHOLHDL, LDLDIRECT in the last 72 hours. Thyroid Function Tests: No results for input(s): TSH, T4TOTAL, FREET4, T3FREE, THYROIDAB in the last 72 hours. Anemia Panel: No results for input(s): VITAMINB12, FOLATE, FERRITIN, TIBC, IRON, RETICCTPCT in the last 72 hours. Sepsis Labs: Recent Labs  Lab 02/28/18 2331  LATICACIDVEN 1.86    No results found for this or any previous visit (from the past 240 hour(s)).       Radiology Studies: Dg Tibia/fibula Left  Result Date:  03/01/2018 CLINICAL DATA:  Necrosis, known ischemia EXAM: LEFT TIBIA AND FIBULA - 2 VIEW COMPARISON:  None. FINDINGS: Degenerative changes in the left knee with joint space narrowing and spurring. No acute bony abnormality. Specifically, no fracture, subluxation, or dislocation. No radiographic changes of osteomyelitis. Soft tissues appear intact. IMPRESSION: No acute bony abnormality. No radiographic changes of osteomyelitis. Electronically Signed   By: Charlett Nose M.D.   On: 03/01/2018 00:19   Dg Foot Complete Left  Result Date: 03/01/2018 CLINICAL DATA:  Necrosis, lower extremity ischemia EXAM: LEFT FOOT - COMPLETE 3+ VIEW COMPARISON:  None. FINDINGS: No acute bony abnormality. Specifically, no fracture, subluxation, or dislocation. No radiographic changes of osteomyelitis. Soft tissues are intact. IMPRESSION: No acute bony abnormality. Electronically Signed   By: Charlett Nose M.D.   On: 03/01/2018 00:20        Scheduled Meds: . amLODipine  10 mg Oral Daily  . clopidogrel  75 mg Oral Daily  . heparin  3,000 Units Intravenous Once  . metoprolol succinate  25  mg Oral Daily   Continuous Infusions: . 0.9 % NaCl with KCl 20 mEq / L 90 mL/hr at 03/01/18 0531  . heparin    . piperacillin-tazobactam (ZOSYN)  IV    . vancomycin       LOS: 0 days    Time spent: 35 minutes.     Alba Cory, MD [facility] Pager [phone]  If 7PM-7AM, please contact night-coverage [email or web address] Password [email or web address] 03/01/2018, 7:52 AM

## 2018-03-02 ENCOUNTER — Inpatient Hospital Stay (HOSPITAL_COMMUNITY): Payer: Medicare Other | Admitting: Certified Registered Nurse Anesthetist

## 2018-03-02 ENCOUNTER — Encounter (HOSPITAL_COMMUNITY): Payer: Self-pay

## 2018-03-02 ENCOUNTER — Encounter (HOSPITAL_COMMUNITY)
Admission: EM | Disposition: A | Discharge status: Skilled Nursing Facility | Payer: Self-pay | Source: Home / Self Care | Attending: Internal Medicine

## 2018-03-02 HISTORY — PX: AMPUTATION: SHX166

## 2018-03-02 LAB — BASIC METABOLIC PANEL
ANION GAP: 8 (ref 5–15)
BUN: 5 mg/dL — ABNORMAL LOW (ref 8–23)
CHLORIDE: 105 mmol/L (ref 98–111)
CO2: 23 mmol/L (ref 22–32)
Calcium: 8.1 mg/dL — ABNORMAL LOW (ref 8.9–10.3)
Creatinine, Ser: 0.71 mg/dL (ref 0.61–1.24)
GFR calc non Af Amer: >60 mL/min (ref >60)
Glucose, Bld: 98 mg/dL (ref 70–99)
POTASSIUM: 3.1 mmol/L — AB (ref 3.5–5.1)
SODIUM: 136 mmol/L (ref 135–145)

## 2018-03-02 LAB — RETICULOCYTES
Immature Retic Fract: 16.6 % — ABNORMAL HIGH (ref 2.3–15.9)
RBC.: 3.33 MIL/uL — AB (ref 4.22–5.81)
Retic Count, Absolute: 54.9 10*3/uL (ref 19.0–186.0)
Retic Ct Pct: 1.7 % (ref 0.4–3.1)

## 2018-03-02 LAB — CBC
HEMATOCRIT: 27 % — AB (ref 39.0–52.0)
HEMOGLOBIN: 8.6 g/dL — AB (ref 13.0–17.0)
MCH: 25.8 pg — ABNORMAL LOW (ref 26.0–34.0)
MCHC: 31.9 g/dL (ref 30.0–36.0)
MCV: 81.1 fL (ref 80.0–100.0)
NRBC: 0 % (ref 0.0–0.2)
Platelets: 589 10*3/uL — ABNORMAL HIGH (ref 150–400)
RBC: 3.33 MIL/uL — AB (ref 4.22–5.81)
RDW: 16.4 % — AB (ref 11.5–15.5)
WBC: 12.9 10*3/uL — AB (ref 4.0–10.5)

## 2018-03-02 LAB — IRON AND TIBC
IRON: 14 ug/dL — AB (ref 45–182)
SATURATION RATIOS: 11 % — AB (ref 17.9–39.5)
TIBC: 123 ug/dL — AB (ref 250–450)
UIBC: 109 ug/dL

## 2018-03-02 LAB — FERRITIN: Ferritin: 156 ng/mL (ref 24–336)

## 2018-03-02 LAB — FOLATE: FOLATE: 8.2 ng/mL (ref >5.9)

## 2018-03-02 LAB — SURGICAL PCR SCREEN
MRSA, PCR: POSITIVE — AB
STAPHYLOCOCCUS AUREUS: POSITIVE — AB

## 2018-03-02 LAB — VITAMIN B12: VITAMIN B 12: 264 pg/mL (ref 180–914)

## 2018-03-02 SURGERY — AMPUTATION BELOW KNEE
Anesthesia: General | Laterality: Left

## 2018-03-02 MED ORDER — KETOROLAC TROMETHAMINE 30 MG/ML IJ SOLN
INTRAMUSCULAR | Status: DC | PRN
  Administered 2018-03-02: 30 mg via INTRAVENOUS

## 2018-03-02 MED ORDER — FERROUS SULFATE 325 (65 FE) MG PO TABS
325.0000 mg | ORAL_TABLET | Freq: Two times a day (BID) | ORAL | Status: DC
  Administered 2018-03-02 – 2018-03-07 (×9): 325 mg via ORAL
  Filled 2018-03-02 (×9): qty 1

## 2018-03-02 MED ORDER — PROPOFOL 10 MG/ML IV BOLUS
INTRAVENOUS | Status: DC | PRN
  Administered 2018-03-02: 160 mg via INTRAVENOUS

## 2018-03-02 MED ORDER — PHENYLEPHRINE 40 MCG/ML (10ML) SYRINGE FOR IV PUSH (FOR BLOOD PRESSURE SUPPORT)
PREFILLED_SYRINGE | INTRAVENOUS | Status: AC
  Filled 2018-03-02: qty 10

## 2018-03-02 MED ORDER — MORPHINE SULFATE (PF) 2 MG/ML IV SOLN: 1.0000 mg | INTRAVENOUS | Status: DC | PRN

## 2018-03-02 MED ORDER — MIDAZOLAM HCL 2 MG/2ML IJ SOLN
INTRAMUSCULAR | Status: AC
  Filled 2018-03-02: qty 2

## 2018-03-02 MED ORDER — OXYCODONE HCL 5 MG/5ML PO SOLN: 5.0000 mg | Freq: Once | ORAL | Status: DC | PRN

## 2018-03-02 MED ORDER — FENTANYL CITRATE (PF) 250 MCG/5ML IJ SOLN
INTRAMUSCULAR | Status: AC
  Filled 2018-03-02: qty 5

## 2018-03-02 MED ORDER — PROPOFOL 10 MG/ML IV BOLUS
INTRAVENOUS | Status: AC
  Filled 2018-03-02: qty 20

## 2018-03-02 MED ORDER — CHLORHEXIDINE GLUCONATE CLOTH 2 % EX PADS
6.0000 | MEDICATED_PAD | Freq: Every day | CUTANEOUS | Status: AC
  Administered 2018-03-02 – 2018-03-03 (×2): 6 via TOPICAL

## 2018-03-02 MED ORDER — LIDOCAINE 2% (20 MG/ML) 5 ML SYRINGE
INTRAMUSCULAR | Status: AC
  Filled 2018-03-02: qty 5

## 2018-03-02 MED ORDER — PROMETHAZINE HCL 25 MG/ML IJ SOLN: 6.2500 mg | INTRAMUSCULAR | Status: DC | PRN

## 2018-03-02 MED ORDER — SODIUM CHLORIDE 0.9 % IV SOLN
INTRAVENOUS | Status: DC
  Administered 2018-03-02: 18:00:00 via INTRAVENOUS

## 2018-03-02 MED ORDER — ONDANSETRON HCL 4 MG/2ML IJ SOLN
INTRAMUSCULAR | Status: AC
  Filled 2018-03-02: qty 2

## 2018-03-02 MED ORDER — DEXAMETHASONE SODIUM PHOSPHATE 10 MG/ML IJ SOLN
INTRAMUSCULAR | Status: AC
  Filled 2018-03-02: qty 1

## 2018-03-02 MED ORDER — MORPHINE SULFATE (PF) 2 MG/ML IV SOLN: 2.0000 mg | INTRAVENOUS | Status: DC | PRN

## 2018-03-02 MED ORDER — GUAIFENESIN-DM 100-10 MG/5ML PO SYRP
15.0000 mL | ORAL_SOLUTION | ORAL | Status: DC | PRN
  Filled 2018-03-02: qty 15

## 2018-03-02 MED ORDER — DEXAMETHASONE SODIUM PHOSPHATE 10 MG/ML IJ SOLN
INTRAMUSCULAR | Status: DC | PRN
  Administered 2018-03-02: 4 mg via INTRAVENOUS

## 2018-03-02 MED ORDER — MUPIROCIN 2 % EX OINT
1.0000 "application " | TOPICAL_OINTMENT | Freq: Two times a day (BID) | CUTANEOUS | Status: AC
  Administered 2018-03-02 – 2018-03-06 (×10): 1 via NASAL
  Filled 2018-03-02 (×2): qty 22

## 2018-03-02 MED ORDER — LIDOCAINE 2% (20 MG/ML) 5 ML SYRINGE
INTRAMUSCULAR | Status: DC | PRN
  Administered 2018-03-02: 80 mg via INTRAVENOUS

## 2018-03-02 MED ORDER — OXYCODONE HCL 5 MG PO TABS: 5.0000 mg | ORAL_TABLET | Freq: Once | ORAL | Status: DC | PRN

## 2018-03-02 MED ORDER — POLYETHYLENE GLYCOL 3350 17 G PO PACK
17.0000 g | PACK | Freq: Two times a day (BID) | ORAL | Status: DC
  Administered 2018-03-02 – 2018-03-03 (×3): 17 g via ORAL
  Filled 2018-03-02 (×7): qty 1

## 2018-03-02 MED ORDER — PHENYLEPHRINE HCL 10 MG/ML IJ SOLN
INTRAMUSCULAR | Status: DC | PRN
  Administered 2018-03-02 (×3): 120 ug via INTRAVENOUS

## 2018-03-02 MED ORDER — PHENOL 1.4 % MT LIQD: 1.0000 | OROMUCOSAL | Status: DC | PRN

## 2018-03-02 MED ORDER — KETOROLAC TROMETHAMINE 30 MG/ML IJ SOLN
INTRAMUSCULAR | Status: AC
  Filled 2018-03-02: qty 1

## 2018-03-02 MED ORDER — FENTANYL CITRATE (PF) 100 MCG/2ML IJ SOLN
INTRAMUSCULAR | Status: DC | PRN
  Administered 2018-03-02: 25 ug via INTRAVENOUS
  Administered 2018-03-02 (×2): 50 ug via INTRAVENOUS
  Administered 2018-03-02: 25 ug via INTRAVENOUS
  Administered 2018-03-02 (×7): 50 ug via INTRAVENOUS

## 2018-03-02 MED ORDER — LACTATED RINGERS IV SOLN
INTRAVENOUS | Status: DC
  Administered 2018-03-02 (×2): via INTRAVENOUS

## 2018-03-02 MED ORDER — POTASSIUM CHLORIDE 10 MEQ/100ML IV SOLN
10.0000 meq | INTRAVENOUS | Status: AC
  Administered 2018-03-02 (×2): 10 meq via INTRAVENOUS
  Filled 2018-03-02 (×2): qty 100

## 2018-03-02 MED ORDER — EPHEDRINE 5 MG/ML INJ
INTRAVENOUS | Status: AC
  Filled 2018-03-02: qty 10

## 2018-03-02 MED ORDER — ONDANSETRON HCL 4 MG/2ML IJ SOLN
INTRAMUSCULAR | Status: DC | PRN
  Administered 2018-03-02: 4 mg via INTRAVENOUS

## 2018-03-02 MED ORDER — POTASSIUM CHLORIDE CRYS ER 20 MEQ PO TBCR
40.0000 meq | EXTENDED_RELEASE_TABLET | Freq: Once | ORAL | Status: AC
  Administered 2018-03-02: 40 meq via ORAL
  Filled 2018-03-02: qty 2

## 2018-03-02 MED ORDER — 0.9 % SODIUM CHLORIDE (POUR BTL) OPTIME
TOPICAL | Status: DC | PRN
  Administered 2018-03-02: 1000 mL

## 2018-03-02 MED ORDER — ROCURONIUM BROMIDE 50 MG/5ML IV SOSY
PREFILLED_SYRINGE | INTRAVENOUS | Status: AC
  Filled 2018-03-02: qty 10

## 2018-03-02 SURGICAL SUPPLY — 57 items
BANDAGE ACE 4X5 VEL STRL LF (GAUZE/BANDAGES/DRESSINGS) IMPLANT
BANDAGE ACE 6X5 VEL STRL LF (GAUZE/BANDAGES/DRESSINGS) IMPLANT
BANDAGE ELASTIC 4 VELCRO ST LF (GAUZE/BANDAGES/DRESSINGS) ×3 IMPLANT
BANDAGE ESMARK 6X9 LF (GAUZE/BANDAGES/DRESSINGS) IMPLANT
BLADE SAW SAG 73X25 THK (BLADE) ×1
BLADE SAW SGTL 73X25 THK (BLADE) ×2 IMPLANT
BNDG COHESIVE 6X5 TAN STRL LF (GAUZE/BANDAGES/DRESSINGS) ×3 IMPLANT
BNDG ESMARK 6X9 LF (GAUZE/BANDAGES/DRESSINGS)
BNDG GAUZE ELAST 4 BULKY (GAUZE/BANDAGES/DRESSINGS) ×3 IMPLANT
CANISTER SUCT 3000ML PPV (MISCELLANEOUS) ×3 IMPLANT
CLIP VESOCCLUDE MED 6/CT (CLIP) IMPLANT
COVER BACK TABLE 60X90IN (DRAPES) IMPLANT
COVER SURGICAL LIGHT HANDLE (MISCELLANEOUS) ×3 IMPLANT
COVER WAND RF STERILE (DRAPES) ×3 IMPLANT
CUFF TOURNIQUET SINGLE 24IN (TOURNIQUET CUFF) IMPLANT
CUFF TOURNIQUET SINGLE 34IN LL (TOURNIQUET CUFF) IMPLANT
CUFF TOURNIQUET SINGLE 44IN (TOURNIQUET CUFF) IMPLANT
DRAIN CHANNEL 19F RND (DRAIN) IMPLANT
DRAPE HALF SHEET 40X57 (DRAPES) ×3 IMPLANT
DRAPE ORTHO SPLIT 77X108 STRL (DRAPES) ×4
DRAPE SURG ORHT 6 SPLT 77X108 (DRAPES) ×2 IMPLANT
DRSG ADAPTIC 3X8 NADH LF (GAUZE/BANDAGES/DRESSINGS) ×3 IMPLANT
ELECT REM PT RETURN 9FT ADLT (ELECTROSURGICAL) ×3
ELECTRODE REM PT RTRN 9FT ADLT (ELECTROSURGICAL) ×1 IMPLANT
EVACUATOR SILICONE 100CC (DRAIN) IMPLANT
GAUZE SPONGE 4X4 12PLY STRL (GAUZE/BANDAGES/DRESSINGS) ×3 IMPLANT
GLOVE BIO SURGEON STRL SZ7.5 (GLOVE) ×6 IMPLANT
GLOVE BIOGEL PI IND STRL 6.5 (GLOVE) ×1 IMPLANT
GLOVE BIOGEL PI IND STRL 8 (GLOVE) ×1 IMPLANT
GLOVE BIOGEL PI INDICATOR 6.5 (GLOVE) ×2
GLOVE BIOGEL PI INDICATOR 8 (GLOVE) ×2
GOWN STRL REUS W/ TWL LRG LVL3 (GOWN DISPOSABLE) ×2 IMPLANT
GOWN STRL REUS W/ TWL XL LVL3 (GOWN DISPOSABLE) ×2 IMPLANT
GOWN STRL REUS W/TWL LRG LVL3 (GOWN DISPOSABLE) ×4
GOWN STRL REUS W/TWL XL LVL3 (GOWN DISPOSABLE) ×4
KIT BASIN OR (CUSTOM PROCEDURE TRAY) ×3 IMPLANT
KIT TURNOVER KIT B (KITS) ×3 IMPLANT
NS IRRIG 1000ML POUR BTL (IV SOLUTION) ×3 IMPLANT
PACK GENERAL/GYN (CUSTOM PROCEDURE TRAY) ×3 IMPLANT
PAD ARMBOARD 7.5X6 YLW CONV (MISCELLANEOUS) ×6 IMPLANT
STAPLER VISISTAT 35W (STAPLE) ×3 IMPLANT
STOCKINETTE IMPERVIOUS LG (DRAPES) ×3 IMPLANT
SUT ETHILON 3 0 PS 1 (SUTURE) IMPLANT
SUT SILK 0 TIES 10X30 (SUTURE) IMPLANT
SUT SILK 2 0 (SUTURE) ×2
SUT SILK 2-0 18XBRD TIE 12 (SUTURE) ×1 IMPLANT
SUT SILK 3 0 (SUTURE) ×2
SUT SILK 3 0 SH CR/8 (SUTURE) ×3 IMPLANT
SUT SILK 3-0 18XBRD TIE 12 (SUTURE) ×1 IMPLANT
SUT VIC AB 2-0 CT1 18 (SUTURE) ×6 IMPLANT
SUT VIC AB 2-0 CT1 27 (SUTURE) ×4
SUT VIC AB 2-0 CT1 TAPERPNT 27 (SUTURE) ×2 IMPLANT
SUT VIC AB 3-0 SH 18 (SUTURE) IMPLANT
TAPE UMBILICAL COTTON 1/8X30 (MISCELLANEOUS) IMPLANT
TOWEL GREEN STERILE (TOWEL DISPOSABLE) ×6 IMPLANT
UNDERPAD 30X30 (UNDERPADS AND DIAPERS) ×3 IMPLANT
WATER STERILE IRR 1000ML POUR (IV SOLUTION) ×3 IMPLANT

## 2018-03-02 NOTE — Anesthesia Procedure Notes (Signed)
Procedure Name: LMA Insertion Date/Time: 03/02/2018 1:55 PM Performed by: Epifanio Lesches, CRNA Pre-anesthesia Checklist: Patient identified, Emergency Drugs available, Suction available and Patient being monitored Patient Re-evaluated:Patient Re-evaluated prior to induction Oxygen Delivery Method: Circle System Utilized Preoxygenation: Pre-oxygenation with 100% oxygen Induction Type: IV induction Ventilation: Mask ventilation without difficulty LMA: LMA inserted LMA Size: 4.0 Number of attempts: 1 Airway Equipment and Method: Bite block Placement Confirmation: positive ETCO2 and breath sounds checked- equal and bilateral Tube secured with: Tape Dental Injury: Teeth and Oropharynx as per pre-operative assessment

## 2018-03-02 NOTE — Progress Notes (Signed)
Discussed with patient plan for left below knee amputation after discussion with Dr. Randie Heinz.  Patient is agreeable to proceed and understands left BKA with risk that including bleeding, infection, failure to heal, etc.  Discussed would probable have to do slightly higher BKA to get posterior flap given extent of wound on foot/shin.  Cephus Shelling, MD [facility] Office: [phone] Pager: [phone]  Cephus Shelling

## 2018-03-02 NOTE — Anesthesia Preprocedure Evaluation (Signed)
Anesthesia Evaluation  Patient identified by MRN, date of birth, ID band Patient awake    Reviewed: Allergy & Precautions, NPO status , Patient's Chart, lab work & pertinent test results  Airway Mallampati: II  TM Distance: >3 FB Neck ROM: Full    Dental no notable dental hx.    Pulmonary Current Smoker,    Pulmonary exam normal breath sounds clear to auscultation       Cardiovascular hypertension, + Peripheral Vascular Disease  Normal cardiovascular exam Rhythm:Regular Rate:Normal     Neuro/Psych Dementia negative neurological ROS     GI/Hepatic negative GI ROS, Neg liver ROS,   Endo/Other  negative endocrine ROS  Renal/GU negative Renal ROS  negative genitourinary   Musculoskeletal negative musculoskeletal ROS (+)   Abdominal   Peds negative pediatric ROS (+)  Hematology  (+) anemia ,   Anesthesia Other Findings   Reproductive/Obstetrics negative OB ROS                             Anesthesia Physical Anesthesia Plan  ASA: III  Anesthesia Plan: General   Post-op Pain Management:    Induction: Intravenous  PONV Risk Score and Plan: 2 and Ondansetron, Dexamethasone and Treatment may vary due to age or medical condition  Airway Management Planned: Oral ETT  Additional Equipment:   Intra-op Plan:   Post-operative Plan: Extubation in OR  Informed Consent: I have reviewed the patients History and Physical, chart, labs and discussed the procedure including the risks, benefits and alternatives for the proposed anesthesia with the patient or authorized representative who has indicated his/her understanding and acceptance.   Dental advisory given  Plan Discussed with: CRNA and Surgeon  Anesthesia Plan Comments:         Anesthesia Quick Evaluation

## 2018-03-02 NOTE — Progress Notes (Signed)
PROGRESS NOTE    Malik Stanton  WUJ:[id number] DOB: 07-07-1950 DOA: 02/28/2018 PCP: [facility]    Brief Narrative: Malik Stanton is a 67 y.o. male with medical history significant for dementia, hypertension, and peripheral arterial disease status post stent to the left external iliac artery and left SFA earlier this month, now presenting to the emergency department for evaluation of worsening ulcerations involving the distal left leg.  Patient's family reportedly became concerned with acute worsening and distal left leg wounds and sent patient to the ED.  He had been admitted earlier this month with critical limb ischemia and underwent successful stenting on 02/20/2018.  Family is not available at this time, but had reported to ED personnel that the wound had worsened acutely.  Patient denies pain at this time but has already been treated with fentanyl prior to exam.  Patient has no complaints.    [facility] ED Course: Upon arrival to the ED, patient is found to be afebrile, saturating well on room air, and with vitals otherwise stable.  Chemistry panel is notable for slight hyponatremia and hypokalemia.  CBC features and increased leukocytosis to 16,000, increasing thrombocytosis now 652,000, and a stable normocytic anemia with hemoglobin 9.9.  Lactic acid is reassuring at 1.86.  Radiographs are negative for gas or osteomyelitis.  Blood cultures were collected and patient was given a liter of normal saline, fentanyl, vancomycin, and Zosyn.  Vascular surgery was consulted by the ED physician and transfer to [facility] for admission was arranged.  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 In the ED no pulse detected with doppler.  Patient was started on IV antibiotics.  Pain management.  Discussed with Dr Randie Heinz, patient needs amputation. He will speak with family. Also patient doesn't need IV heparin.  For below knee amputation 10-25  2-Gangrene lower extremity;  Large LLE ulceration, necrosis, progress in size per family.  Started on IV antibiotics.  Vascular consulted.  For below knee amputation today.   3-Hypokalemia;  Labs pending for the morning.   Replete with IV ckl and oral.    Anemia;  Iron deficiency anemia;  Start ferrous sulfate.   HTN; continue with Toprol. Hold Norvasc.  Leukocytosis; in setting of infection.  IV antibiotics.  Follow blood culture,. No growth to date,   DVT prophylaxis: heparin  Code Status: full code.  Family Communication: no family at bedside.  Disposition Plan: remain inpatient for IV antibiotics, surgery today below knee amputation   Consultants:   Dr Randie Heinz.    Procedures:   none   Antimicrobials:  Vancomycin   Zosyn,    Subjective: He is alert, denies chest pain or dyspnea.  He is aware that he need amputation   Objective: Vitals:   03/01/18 2054 03/02/18 0331 03/02/18 1034 03/02/18 1515  BP: 136/74 130/78 137/85   Pulse: 78 93 85   Resp: 16 18 16    Temp: 99.1 F (37.3 C) 99.2 F (37.3 C) 98.4 F (36.9 C) 97.7 F (36.5 C)  TempSrc: Oral Oral Oral   SpO2: 100% 93% 99%   Weight:      Height:  Intake/Output Summary (Last 24 hours) at 03/02/2018 1530 Last data filed at 03/02/2018 1449 Gross per 24 hour  Intake 2012.87 ml  Output 640 ml  Net 1372.87 ml   Filed Weights   02/28/18 2103  Weight: 56.9 kg    Examination:  General exam: NAD Respiratory system: CTA Cardiovascular system: S 1, S 2 RRR Gastrointestinal system: BS present, soft, nt Central nervous system: alert, follows command,  Extremities: moves BL lower  extremities.  Skin: left LE with wound, necrotic tissue lower part of leg.      Data Reviewed: I have personally reviewed following labs and imaging studies  CBC: Recent Labs  Lab 02/28/18 2319 03/01/18 0800 03/02/18 0358  WBC 16.0* 13.3* 12.9*  NEUTROABS 11.6*  --   --   HGB 9.9* 8.1* 8.6*  HCT 30.7* 25.2* 27.0*  MCV 82.7 80.8 81.1  PLT 652* 562* 589*   Basic Metabolic Panel: Recent Labs  Lab 02/28/18 2319 03/01/18 0800 03/02/18 0358  NA 134* 137 136  K 3.4* 3.1* 3.1*  CL 101 109 105  CO2 22 23 23   GLUCOSE 120* 106* 98  BUN 11 5* 5*  CREATININE 0.76 0.65 0.71  CALCIUM 8.6* 8.0* 8.1*   GFR: Estimated Creatinine Clearance: 73.1 mL/min (by C-G formula based on SCr of 0.71 mg/dL). Liver Function Tests: No results for input(s): AST, ALT, ALKPHOS, BILITOT, PROT, ALBUMIN in the last 168 hours. No results for input(s): LIPASE, AMYLASE in the last 168 hours. No results for input(s): AMMONIA in the last 168 hours. Coagulation Profile: No results for input(s): INR, PROTIME in the last 168 hours. Cardiac Enzymes: Recent Labs  Lab 02/28/18 2319  CKTOTAL 59   BNP (last 3 results) No results for input(s): PROBNP in the last 8760 hours. HbA1C: No results for input(s): HGBA1C in the last 72 hours. CBG: No results for input(s): GLUCAP in the last 168 hours. Lipid Profile: No results for input(s): CHOL, HDL, LDLCALC, TRIG, CHOLHDL, LDLDIRECT in the last 72 hours. Thyroid Function Tests: No results for input(s): TSH, T4TOTAL, FREET4, T3FREE, THYROIDAB in the last 72 hours. Anemia Panel: Recent Labs    03/02/18 0358  VITAMINB12 264  FOLATE 8.2  FERRITIN 156  TIBC 123*  IRON 14*  RETICCTPCT 1.7   Sepsis Labs: Recent Labs  Lab 02/28/18 2331  LATICACIDVEN 1.86    Recent Results (from the past 240 hour(s))  Blood culture (routine x 2)     Status: None (Preliminary result)   Collection Time: 02/28/18 11:22 PM  Result Value Ref Range Status   Specimen  Description   Final    BLOOD RIGHT ANTECUBITAL Performed at [facility], [address] [place], [place] [address]    Special Requests   Final    BOTTLES DRAWN AEROBIC AND ANAEROBIC Blood Culture adequate volume Performed at [facility], [address], [place], [place] [address]    Culture   Final    NO GROWTH 1 DAY Performed at [facility], [address] [place], [place] [address]    Report Status PENDING  Incomplete  Blood culture (routine x 2)     Status: None (Preliminary result)   Collection Time: 02/28/18 11:40 PM  Result Value Ref Range Status   Specimen Description   Final    BLOOD LEFT ANTECUBITAL Performed at [facility], [address], [place], [place] [address]    Special Requests   Final    BOTTLES  DRAWN AEROBIC AND ANAEROBIC Blood Culture adequate volume Performed at [facility], [address] [place], [place] [address]    Culture   Final    NO GROWTH 1 DAY Performed at [facility], [address] [place], [place] [address]    Report Status PENDING  Incomplete  Surgical pcr screen     Status: Abnormal   Collection Time: 03/02/18  3:39 AM  Result Value Ref Range Status   MRSA, PCR POSITIVE (A) NEGATIVE Final    Comment: RESULT CALLED TO, READ BACK BY AND VERIFIED WITH: RN R ARCILLA 03/02/18 AT 804 AM BY CM    Staphylococcus aureus POSITIVE (A) NEGATIVE Final    Comment: RESULT CALLED TO, READ BACK BY AND VERIFIED WITH: RN R ARCILLA 03/02/18 AT 804 AM BY CM (NOTE) The Xpert SA Assay (FDA approved for NASAL specimens in patients 56 years of age and older), is one component of a comprehensive surveillance program. It is not intended to diagnose infection nor to guide or monitor treatment. Performed at [facility], [address], [place], [place] [address]          Radiology Studies: Dg Tibia/fibula Left  Result Date: 03/01/2018 CLINICAL DATA:  Necrosis, known ischemia EXAM:  LEFT TIBIA AND FIBULA - 2 VIEW COMPARISON:  None. FINDINGS: Degenerative changes in the left knee with joint space narrowing and spurring. No acute bony abnormality. Specifically, no fracture, subluxation, or dislocation. No radiographic changes of osteomyelitis. Soft tissues appear intact. IMPRESSION: No acute bony abnormality. No radiographic changes of osteomyelitis. Electronically Signed   By: Charlett Nose M.D.   On: 03/01/2018 00:19   Dg Foot Complete Left  Result Date: 03/01/2018 CLINICAL DATA:  Necrosis, lower extremity ischemia EXAM: LEFT FOOT - COMPLETE 3+ VIEW COMPARISON:  None. FINDINGS: No acute bony abnormality. Specifically, no fracture, subluxation, or dislocation. No radiographic changes of osteomyelitis. Soft tissues are intact. IMPRESSION: No acute bony abnormality. Electronically Signed   By: Charlett Nose M.D.   On: 03/01/2018 00:20        Scheduled Meds: . [MAR Hold] Chlorhexidine Gluconate Cloth  6 each Topical Q0600  . [MAR Hold] clopidogrel  75 mg Oral Daily  . [MAR Hold] ferrous sulfate  325 mg Oral BID WC  . [MAR Hold] heparin injection (subcutaneous)  5,000 Units Subcutaneous Q8H  . [MAR Hold] metoprolol succinate  25 mg Oral Daily  . [MAR Hold] mupirocin ointment  1 application Nasal BID  . [MAR Hold] polyethylene glycol  17 g Oral BID   Continuous Infusions: . lactated ringers    . [MAR Hold] piperacillin-tazobactam (ZOSYN)  IV 3.375 g (03/02/18 0616)  . [MAR Hold] vancomycin 750 mg (03/02/18 1031)     LOS: 1 day    Time spent: 35 minutes.     Alba Cory, MD [facility] Pager [phone]  If 7PM-7AM, please contact night-coverage [email or web address] Password [email or web address] 03/02/2018, 3:30 PM

## 2018-03-02 NOTE — Op Note (Signed)
Date: March 02, 2018  Preoperative diagnosis: Critical limb ischemia of the left lower extremity with tissue loss  Postoperative diagnosis: Same  Procedure: Left below-knee amputation  Surgeon: Dr. Cephus Shelling, MD  Assistant: Aggie Moats, PA  Indications: Patient is a 67 year old male who has severe tissue loss of his left lower extremity.  Dr. Randie Heinz subsequent recommended a left below-knee amputation and did not feel that his limb was salvageable.  He presents today for a left below-knee amputation after risk and benefits were discussed.  Findings: Healthy tissue margins margins in the below-knee amputation proximal stump.  Anesthesia: General  Details: The patient was taken to the operating room after informed consent was obtained.  He was placed on operative table in spine position.  His left leg was prepped and draped in usual sterile fashion.  We did place a sterile sock over his foot given the significance of the tissue loss here.  Ultimately we marked out 3 fingerbreadths below his tibial tuberosity for our anterior incision.  I did make this a little shorter given that he had significant tissue loss on his posterior shin and we needed enough room to get a posterior flap.  I then used the two thirds one third rule using a 2-0 silk to get a circumference around his calf at this location.  Then used two thirds the distance for our anterior posterior flap and then one third for the distal flap.  This was marked on the skin and then our incision was made with scalpel and extended with Bovie cautery.  The anterior tibial, posterior tibial, and peroneal arteries were all individually identified and ligated with 2-0 silk ties.  Ultimately I beveled the tibia and fibula with a oscillating saw.  I did take the fibula shorter than the tibia.  The muscle was transected with Bovie cautery.  I made sure he had a nice posterior flap using gastrocnemius muscle.  The wound was then copiously  irrigated with saline until everything was clear.  We then closed the amputation stump using a posterior flap with interrupted 2-0 Vicryl sutures.  Skin was closed with staples.  He tolerated the procedure without any apparent complications.  Condition: Stable  Complications: None  Cephus Shelling, MD [facility] Office: [phone] Pager: [phone]  Cephus Shelling

## 2018-03-02 NOTE — Transfer of Care (Signed)
Immediate Anesthesia Transfer of Care Note  Patient: Malik Stanton  Procedure(s) Performed: AMPUTATION BELOW KNEE (Left )  Patient Location: PACU  Anesthesia Type:General  Level of Consciousness: drowsy and patient cooperative  Airway & Oxygen Therapy: Patient Spontanous Breathing and Patient connected to face mask oxygen  Post-op Assessment: Report given to RN and Post -op Vital signs reviewed and stable  Post vital signs: Reviewed and stable  Last Vitals:  Vitals Value Taken Time  BP 148/85 03/02/2018  3:14 PM  Temp    Pulse 104 03/02/2018  3:14 PM  Resp 21 03/02/2018  3:15 PM  SpO2 99 % 03/02/2018  3:14 PM  Vitals shown include unvalidated device data.  Last Pain:  Vitals:   03/02/18 1125  TempSrc:   PainSc: 0-No pain      Patients Stated Pain Goal: 2 (03/01/18 1546)  Complications: No apparent anesthesia complications

## 2018-03-02 NOTE — Anesthesia Postprocedure Evaluation (Signed)
Anesthesia Post Note  Patient: Malik Stanton  Procedure(s) Performed: AMPUTATION BELOW KNEE (Left )     Patient location during evaluation: PACU Anesthesia Type: General Level of consciousness: awake and alert Pain management: pain level controlled Vital Signs Assessment: post-procedure vital signs reviewed and stable Respiratory status: spontaneous breathing, nonlabored ventilation, respiratory function stable and patient connected to nasal cannula oxygen Cardiovascular status: blood pressure returned to baseline and stable Postop Assessment: no apparent nausea or vomiting Anesthetic complications: no    Last Vitals:  Vitals:   03/02/18 1545 03/02/18 1547  BP:    Pulse: (!) 117   Resp: 16   Temp:  36.6 C  SpO2: 96%     Last Pain:  Vitals:   03/02/18 1125  TempSrc:   PainSc: 0-No pain                 [name]

## 2018-03-02 NOTE — Plan of Care (Signed)

## 2018-03-03 ENCOUNTER — Encounter (HOSPITAL_COMMUNITY): Payer: Self-pay | Admitting: Vascular Surgery

## 2018-03-03 DIAGNOSIS — Z89512 Acquired absence of left leg below knee: Secondary | ICD-10-CM

## 2018-03-03 DIAGNOSIS — I96 Gangrene, not elsewhere classified: Secondary | ICD-10-CM

## 2018-03-03 LAB — CBC
HCT: 21.4 % — ABNORMAL LOW (ref 39.0–52.0)
HEMOGLOBIN: 7 g/dL — AB (ref 13.0–17.0)
MCH: 26.8 pg (ref 26.0–34.0)
MCHC: 32.7 g/dL (ref 30.0–36.0)
MCV: 82 fL (ref 80.0–100.0)
PLATELETS: 490 10*3/uL — AB (ref 150–400)
RBC: 2.61 MIL/uL — AB (ref 4.22–5.81)
RDW: 16.4 % — ABNORMAL HIGH (ref 11.5–15.5)
WBC: 12.2 10*3/uL — ABNORMAL HIGH (ref 4.0–10.5)
nRBC: 0 % (ref 0.0–0.2)

## 2018-03-03 LAB — ABO/RH: ABO/RH(D): A POS

## 2018-03-03 LAB — BASIC METABOLIC PANEL
ANION GAP: 6 (ref 5–15)
BUN: 6 mg/dL — ABNORMAL LOW (ref 8–23)
CALCIUM: 8 mg/dL — AB (ref 8.9–10.3)
CO2: 22 mmol/L (ref 22–32)
Chloride: 109 mmol/L (ref 98–111)
Creatinine, Ser: 0.76 mg/dL (ref 0.61–1.24)
GFR calc non Af Amer: >60 mL/min (ref >60)
GLUCOSE: 118 mg/dL — AB (ref 70–99)
Potassium: 4 mmol/L (ref 3.5–5.1)
Sodium: 137 mmol/L (ref 135–145)

## 2018-03-03 LAB — MAGNESIUM: MAGNESIUM: 1.8 mg/dL (ref 1.7–2.4)

## 2018-03-03 LAB — VANCOMYCIN, TROUGH: Vancomycin Tr: 13 ug/mL — ABNORMAL LOW (ref 15–20)

## 2018-03-03 LAB — PREPARE RBC (CROSSMATCH)

## 2018-03-03 MED ORDER — SODIUM CHLORIDE 0.9% IV SOLUTION
Freq: Once | INTRAVENOUS | Status: AC
  Administered 2018-03-03: 12:00:00 via INTRAVENOUS

## 2018-03-03 MED ORDER — MAGNESIUM SULFATE 2 GM/50ML IV SOLN
2.0000 g | Freq: Once | INTRAVENOUS | Status: AC
  Administered 2018-03-03: 2 g via INTRAVENOUS
  Filled 2018-03-03: qty 50

## 2018-03-03 MED ORDER — VITAMIN B-12 1000 MCG PO TABS
1000.0000 ug | ORAL_TABLET | Freq: Every day | ORAL | Status: DC
  Administered 2018-03-03 – 2018-03-07 (×5): 1000 ug via ORAL
  Filled 2018-03-03 (×5): qty 1

## 2018-03-03 NOTE — Progress Notes (Signed)
PROGRESS NOTE    Malik Stanton  WGN:[id number] DOB: 05-04-1951 DOA: 02/28/2018 PCP: [facility]    Brief Narrative: Malik Stanton is a 67 y.o. male with medical history significant for dementia, hypertension, and peripheral arterial disease status post stent to the left external iliac artery and left SFA earlier this month, now presenting to the emergency department for evaluation of worsening ulcerations involving the distal left leg.  Patient's family reportedly became concerned with acute worsening and distal left leg wounds and sent patient to the ED.  He had been admitted earlier this month with critical limb ischemia and underwent successful stenting on 02/20/2018.  Family is not available at this time, but had reported to ED personnel that the wound had worsened acutely.  Patient denies pain at this time but has already been treated with fentanyl prior to exam.  Patient has no complaints.    [facility] ED Course: Upon arrival to the ED, patient is found to be afebrile, saturating well on room air, and with vitals otherwise stable.  Chemistry panel is notable for slight hyponatremia and hypokalemia.  CBC features and increased leukocytosis to 16,000, increasing thrombocytosis now 652,000, and a stable normocytic anemia with hemoglobin 9.9.  Lactic acid is reassuring at 1.86.  Radiographs are negative for gas or osteomyelitis.  Blood cultures were collected and patient was given a liter of normal saline, fentanyl, vancomycin, and Zosyn.  Vascular surgery was consulted by the ED physician and transfer to [facility] for admission was arranged.  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 In the ED no pulse detected with doppler.  Patient was started on IV antibiotics.  Pain management.  Discussed with Dr Randie Heinz, patient needs amputation. He will speak with family. Also patient doesn't need IV heparin.  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   2-Gangrene lower extremity;  Large LLE ulceration, necrosis, progress in size per family.  Started on IV antibiotics.  Vascular consulted.  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   Resolved.    Anemia; Acute blood loss.  Iron deficiency anemia;  Start ferrous sulfate.  Transfuse one unit.   HTN; continue with Toprol. Hold Norvasc.  Leukocytosis; in setting of infection.  IV antibiotics.  Follow blood culture,. No growth to date,   DVT prophylaxis: heparin  Code Status: full code.  Family Communication: no family at bedside.  Disposition Plan: remain inpatient for IV antibiotics, post operative day 1.   Consultants:   Dr Randie Heinz.    Procedures:   none   Antimicrobials:  Vancomycin   Zosyn,    Subjective: He is alert, no new complaint.   Objective: Vitals:   03/03/18 0907 03/03/18 1115 03/03/18 1150 03/03/18 1155  BP: 123/74 (!) 142/78 138/76 (!) 152/78  Pulse: 87 94 95 89  Resp: 16 16 16 16   Temp: 97.9 F (36.6 C) 97.7 F (36.5 C) 98.1 F (36.7 C) 98.1 F (36.7 C)  TempSrc: Oral Oral Oral Oral  SpO2: 100% 99% 100%   Weight:  Height:        Intake/Output Summary (Last 24 hours) at 03/03/2018 1321 Last data filed at 03/03/2018 0911 Gross per 24 hour  Intake 2310.81 ml  Output 200 ml  Net 2110.81 ml   Filed Weights   02/28/18 2103  Weight: 56.9 kg    Examination:  General exam: NAD Respiratory system: CTA Cardiovascular system: S 1, S 2 RRR Gastrointestinal system: BS present, soft, nt Central nervous system: alert,  follows command,  Extremities: S/P Left LE below knee amputation, clean dressing.    Data Reviewed: I have personally reviewed following labs and imaging studies  CBC: Recent Labs  Lab 02/28/18 2319 03/01/18 0800 03/02/18 0358 03/03/18 0714  WBC 16.0* 13.3* 12.9* 12.2*  NEUTROABS 11.6*  --   --   --   HGB 9.9* 8.1* 8.6* 7.0*  HCT 30.7* 25.2* 27.0* 21.4*  MCV 82.7 80.8 81.1 82.0  PLT 652* 562* 589* 490*   Basic Metabolic Panel: Recent Labs  Lab 02/28/18 2319 03/01/18 0800 03/02/18 0358 03/03/18 0714  NA 134* 137 136 137  K 3.4* 3.1* 3.1* 4.0  CL 101 109 105 109  CO2 22 23 23 22   GLUCOSE 120* 106* 98 118*  BUN 11 5* 5* 6*  CREATININE 0.76 0.65 0.71 0.76  CALCIUM 8.6* 8.0* 8.1* 8.0*  MG  --   --   --  1.8   GFR: Estimated Creatinine Clearance: 73.1 mL/min (by C-G formula based on SCr of 0.76 mg/dL). Liver Function Tests: No results for input(s): AST, ALT, ALKPHOS, BILITOT, PROT, ALBUMIN in the last 168 hours. No results for input(s): LIPASE, AMYLASE in the last 168 hours. No results for input(s): AMMONIA in the last 168 hours. Coagulation Profile: No results for input(s): INR, PROTIME in the last 168 hours. Cardiac Enzymes: Recent Labs  Lab 02/28/18 2319  CKTOTAL 59   BNP (last 3 results) No results for input(s): PROBNP in the last 8760 hours. HbA1C: No results for input(s): HGBA1C in the last 72 hours. CBG: No results for input(s): GLUCAP in the last 168 hours. Lipid Profile: No results for input(s): CHOL, HDL, LDLCALC, TRIG, CHOLHDL, LDLDIRECT in the last 72 hours. Thyroid Function Tests: No results for input(s): TSH, T4TOTAL, FREET4, T3FREE, THYROIDAB in the last 72 hours. Anemia Panel: Recent Labs    03/02/18 0358  VITAMINB12 264  FOLATE 8.2  FERRITIN 156  TIBC 123*  IRON 14*  RETICCTPCT 1.7   Sepsis Labs: Recent Labs  Lab 02/28/18 2331  LATICACIDVEN 1.86    Recent Results (from the past 240 hour(s))  Blood culture (routine x 2)      Status: None (Preliminary result)   Collection Time: 02/28/18 11:22 PM  Result Value Ref Range Status   Specimen Description   Final    BLOOD RIGHT ANTECUBITAL Performed at [facility], [address] [place], [place] [address]    Special Requests   Final    BOTTLES DRAWN AEROBIC AND ANAEROBIC Blood Culture adequate volume Performed at [facility], [address] [place], [place] [address]    Culture   Final    NO GROWTH 2 DAYS Performed at [facility], [address] [place], [place] [address]    Report Status PENDING  Incomplete  Blood culture (routine x 2)     Status: None (Preliminary result)   Collection Time: 02/28/18 11:40 PM  Result Value Ref Range Status   Specimen Description   Final  BLOOD LEFT ANTECUBITAL Performed at [facility], [address] [place], [place] [address]    Special Requests   Final    BOTTLES DRAWN AEROBIC AND ANAEROBIC Blood Culture adequate volume Performed at [facility], [address] [place], [place] [address]    Culture   Final    NO GROWTH 2 DAYS Performed at [facility], [address] [place], [place] [address]    Report Status PENDING  Incomplete  Surgical pcr screen     Status: Abnormal   Collection Time: 03/02/18  3:39 AM  Result Value Ref Range Status   MRSA, PCR POSITIVE (A) NEGATIVE Final    Comment: RESULT CALLED TO, READ BACK BY AND VERIFIED WITH: RN R ARCILLA 03/02/18 AT 804 AM BY CM    Staphylococcus aureus POSITIVE (A) NEGATIVE Final    Comment: RESULT CALLED TO, READ BACK BY AND VERIFIED WITH: RN R ARCILLA 03/02/18 AT 804 AM BY CM (NOTE) The Xpert SA Assay (FDA approved for NASAL specimens in patients 68 years of age and older), is one component of a comprehensive surveillance program. It is not intended to diagnose infection nor to guide or monitor treatment. Performed at [facility], [address], [place], [place] [address]           Radiology Studies: No results found.      Scheduled Meds: . Chlorhexidine Gluconate Cloth  6 each Topical Q0600  . clopidogrel  75 mg Oral Daily  . ferrous sulfate  325 mg Oral BID WC  . metoprolol succinate  25 mg Oral Daily  . mupirocin ointment  1 application Nasal BID  . polyethylene glycol  17 g Oral BID  . vitamin B-12  1,000 mcg Oral Daily   Continuous Infusions: . lactated ringers    . piperacillin-tazobactam (ZOSYN)  IV 3.375 g (03/03/18 0508)  . vancomycin 750 mg (03/03/18 1113)     LOS: 2 days    Time spent: 35 minutes.     Alba Cory, MD [facility] Pager [phone]  If 7PM-7AM, please contact night-coverage [email or web address] Password [email or web address] 03/03/2018, 1:21 PM

## 2018-03-03 NOTE — Consult Note (Signed)
Physical Medicine and Rehabilitation Consult Reason for Consult:left AKA Referring Physician: Chestine Spore   HPI: Malik Stanton is a 67 y.o. male with a history significant for dementia hypertension and PAD.  Patient failed conservative measures, developing critical limb ischemia requiring a stent on 02/20/2018.  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 Chestine Spore.  Postop course significant for acute blood loss anemia.  Physical medicine and rehabilitation was asked to assess patient for prospective rehab needs.  Review of Systems  Constitutional: Negative for fever.  HENT: Negative for tinnitus.   Eyes: Negative for double vision.  Respiratory: Negative for cough.   Cardiovascular: Negative for palpitations.  Genitourinary: Negative for urgency.  Musculoskeletal: Positive for joint pain and myalgias.  Skin: Negative for itching.  Neurological: Negative for sensory change.  Psychiatric/Behavioral: Negative for suicidal ideas.   Past Medical History:  Diagnosis Date  . Blood clot in vein 02/2018  . Dementia (HCC)   . Hypertension   . PVD (peripheral vascular disease) (HCC)    Past Surgical History:  Procedure Laterality Date  . ABDOMINAL AORTOGRAM W/LOWER EXTREMITY N/A 02/20/2018   Procedure: ABDOMINAL AORTOGRAM W/LOWER EXTREMITY;  Surgeon: Maeola Harman, MD;  Location: [facility] INVASIVE CV LAB;  Service: Cardiovascular;  Laterality: N/A;  bilateral  . AMPUTATION Left 03/02/2018   Procedure: AMPUTATION BELOW KNEE;  Surgeon: Cephus Shelling, MD;  Location: [facility] OR;  Service: Vascular;  Laterality: Left;  . OTHER SURGICAL HISTORY     "I was small when I had a surgery on my legs, arms and across my stomach" (02/20/2018)  . PERIPHERAL VASCULAR INTERVENTION Left 02/20/2018   lt. SFA, lt iliac stents  . PERIPHERAL VASCULAR INTERVENTION  02/20/2018   Procedure: PERIPHERAL VASCULAR INTERVENTION;  Surgeon: Maeola Harman, MD;  Location: [facility] INVASIVE CV LAB;  Service: Cardiovascular;;  lt. SFA, lt iliac stents   History reviewed. No pertinent family history. Social History:  reports that he has been smoking cigarettes. He has a 25.00 pack-year smoking history. He has never used smokeless tobacco. He reports that he has current or past drug history. He reports that he does not drink alcohol. Allergies: No Known Allergies Medications Prior to Admission  Medication Sig Dispense Refill  . amLODIPine-Valsartan-HCTZ 10-320-25 MG TABS Take 1 tablet by mouth daily.    . metoprolol succinate (TOPROL-XL) 25 MG 24 hr tablet Take 25 mg by mouth daily.  1  . amLODipine (NORVASC) 10 MG tablet Take 10 mg by mouth daily.  1  . clopidogrel (PLAVIX) 75 MG tablet Take 1 tablet (75 mg total) by mouth daily. 30 tablet 0    Home: Home Living Family/patient expects to be discharged to:: Private residence Living Arrangements: Alone Available Help at Discharge: Family, Available PRN/intermittently(grandaughter assists with shopping, bathing ) Type of Home: House Home Access: Stairs to enter [employer] of Steps: 3 Entrance Stairs-Rails: None Home Layout: One level Bathroom Shower/Tub: Health visitor: Standard Home Equipment: Environmental consultant - 2 wheels, Shower seat Additional Comments: h/o obtained from pt, no family present, not consistent with prior hospitalization   Functional History: Prior Function Level of Independence: Needs assistance Gait / Transfers Assistance Needed: ambulated household distances with Rw ADL's / Homemaking Assistance Needed: grandaughter assist with shopping, cooking, bathing Functional Status:  Mobility: Bed Mobility Overal bed mobility: Needs Assistance Bed Mobility: Supine to Sit, Sit to Supine Supine to sit: Supervision, HOB elevated Sit to supine: Supervision General bed mobility  comments: pt moved quickly to seated EoB with supervision for safety,  able to manage R LE into bed without assist, assist of 2 to scoot pt up in bed  Transfers Overall transfer level: Needs assistance Equipment used: Rolling walker (2 wheeled) Transfers: Sit to/from Stand Sit to Stand: Min assist, +2 physical assistance General transfer comment: minAx2 for powerup and steadying with RW, vc for hand placement which he did not follow,  Ambulation/Gait Ambulation/Gait assistance: Min assist, +2 physical assistance Gait Distance (Feet): 3 Feet Assistive device: Rolling walker (2 wheeled) Gait Pattern/deviations: Step-to pattern, Trunk flexed General Gait Details: minAx2 for steadying with hopping to R up to the HoB,  Gait velocity: Decreased Gait velocity interpretation: <1.31 ft/sec, indicative of household ambulator    ADL: ADL Overall ADL's : Needs assistance/impaired Eating/Feeding: Set up, Sitting Grooming: Set up, Sitting Grooming Details (indicate cue type and reason): only able to perform in seated position Upper Body Bathing: Set up, Sitting Lower Body Bathing: Min guard, Sitting/lateral leans Lower Body Bathing Details (indicate cue type and reason): min guard for safety with balance Upper Body Dressing : Set up, Sitting Lower Body Dressing: Maximal assistance, Bed level Toilet Transfer: Minimal assistance, +2 for physical assistance, +2 for safety/equipment Toileting- Clothing Manipulation and Hygiene: Moderate assistance, Sit to/from stand, +2 for physical assistance, +2 for safety/equipment General ADL Comments: edcuated Pt on densensitization of residual limb for potential phantom pains  Cognition: Cognition Overall Cognitive Status: No family/caregiver present to determine baseline cognitive functioning Orientation Level: Oriented to person Cognition Arousal/Alertness: Awake/alert Behavior During Therapy: Flat affect Overall Cognitive Status: No family/caregiver present to determine baseline cognitive functioning General Comments:  Slowed responses, follows majority of simple/one-step commands  Blood pressure (!) 149/87, pulse 94, temperature 98.4 F (36.9 C), temperature source Oral, resp. rate 17, height 5\' 10"  (1.778 m), weight 56.9 kg, SpO2 100 %. Physical Exam  Constitutional: He is oriented to person, place, and time. No distress.  HENT:  Head: Normocephalic.  Eyes: Pupils are equal, round, and reactive to light.  Neck: Normal range of motion.  Cardiovascular: Normal rate.  Respiratory: Effort normal.  GI: Soft.  Musculoskeletal:  Left BK with mild swelling, well-formed  Neurological: He is alert and oriented to person, place, and time. No cranial nerve deficit.  UE 5/5. LLE can lift against gravity. RLE 4/5.   Skin: He is not diaphoretic.  Left BK incision CDI  Psychiatric: He has a normal mood and affect. His behavior is normal.    Results for orders placed or performed during the hospital encounter of 02/28/18 (from the past 24 hour(s))  Vancomycin, trough     Status: Abnormal   Collection Time: 03/03/18  9:13 PM  Result Value Ref Range   Vancomycin Tr 13 (L) 15 - 20 ug/mL  Basic metabolic panel     Status: Abnormal   Collection Time: 03/04/18  3:32 AM  Result Value Ref Range   Sodium 138 135 - 145 mmol/L   Potassium 3.6 3.5 - 5.1 mmol/L   Chloride 108 98 - 111 mmol/L   CO2 22 22 - 32 mmol/L   Glucose, Bld 107 (H) 70 - 99 mg/dL   BUN <5 (L) 8 - 23 mg/dL   Creatinine, Ser 1.61 0.61 - 1.24 mg/dL   Calcium 8.3 (L) 8.9 - 10.3 mg/dL   GFR calc non Af Amer >60 >60 mL/min   GFR calc Af Amer >60 >60 mL/min   Anion gap 8 5 - 15  CBC  Status: Abnormal   Collection Time: 03/04/18  3:32 AM  Result Value Ref Range   WBC 10.9 (H) 4.0 - 10.5 K/uL   RBC 3.42 (L) 4.22 - 5.81 MIL/uL   Hemoglobin 9.3 (L) 13.0 - 17.0 g/dL   HCT 95.2 (L) 84.1 - 32.4 %   MCV 81.3 80.0 - 100.0 fL   MCH 27.2 26.0 - 34.0 pg   MCHC 33.5 30.0 - 36.0 g/dL   RDW 40.1 (H) 02.7 - 25.3 %   Platelets 570 (H) 150 - 400 K/uL    nRBC 0.0 0.0 - 0.2 %   No results found.  Assessment/Plan: Diagnosis: left BKA 1. Does the need for close, 24 hr/day medical supervision in concert with the patient's rehab needs make it unreasonable for this patient to be served in a less intensive setting? Yes 2. Co-Morbidities requiring supervision/potential complications: wound care, pain mgt, HTN, ABLA 3. Due to bladder management, bowel management, safety, skin/wound care, disease management, medication administration, pain management and patient education, does the patient require 24 hr/day rehab nursing? Yes 4. Does the patient require coordinated care of a physician, rehab nurse, PT (1-2 hrs/day, 5 days/week) and OT (1-2 hrs/day, 5 days/week) to address physical and functional deficits in the context of the above medical diagnosis(es)? Yes Addressing deficits in the following areas: balance, endurance, locomotion, strength, transferring, bowel/bladder control, bathing, dressing, feeding, grooming, toileting and psychosocial support 5. Can the patient actively participate in an intensive therapy program of at least 3 hrs of therapy per day at least 5 days per week? Yes 6. The potential for patient to make measurable gains while on inpatient rehab is excellent 7. Anticipated functional outcomes upon discharge from inpatient rehab are modified independent  with PT, modified independent with OT, n/a with SLP. 8. Estimated rehab length of stay to reach the above functional goals is: 7-12 days 9. Anticipated D/C setting: Home 10. Anticipated post D/C treatments: HH therapy 11. Overall Rehab/Functional Prognosis: excellent  RECOMMENDATIONS: This patient's condition is appropriate for continued rehabilitative care in the following setting: CIR Patient has agreed to participate in recommended program. Yes Note that insurance prior authorization may be required for reimbursement for recommended care.  Comment: Pt lives with daughter who works  during the day. Rehab Admissions Coordinator to follow up.  Thanks,  Ranelle Oyster, MD, [place]  I have personally performed a face to face diagnostic evaluation of this patient. Additionally, I have reviewed and concur with the physician assistant's documentation above.    Ranelle Oyster, MD 03/04/2018

## 2018-03-03 NOTE — Progress Notes (Addendum)
  Progress Note    03/03/2018 10:57 AM 1 Day Post-Op  Subjective:  Comfortable this morning  Vitals:   03/03/18 0508 03/03/18 0907  BP: 128/77 123/74  Pulse: 90 87  Resp: 14 16  Temp: 98.6 F (37 C) 97.9 F (36.6 C)  SpO2: 96% 100%    Physical Exam: Incisions:  Dressings left in place L BKA; no breakthrough bleeding   CBC    Component Value Date/Time   WBC 12.2 (H) 03/03/2018 0714   RBC 2.61 (L) 03/03/2018 0714   HGB 7.0 (L) 03/03/2018 0714   HCT 21.4 (L) 03/03/2018 0714   PLT 490 (H) 03/03/2018 0714   MCV 82.0 03/03/2018 0714   MCH 26.8 03/03/2018 0714   MCHC 32.7 03/03/2018 0714   RDW 16.4 (H) 03/03/2018 0714   LYMPHSABS 2.9 02/28/2018 2319   MONOABS 1.1 (H) 02/28/2018 2319   EOSABS 0.2 02/28/2018 2319   BASOSABS 0.1 02/28/2018 2319    BMET    Component Value Date/Time   NA 137 03/03/2018 0714   K 4.0 03/03/2018 0714   CL 109 03/03/2018 0714   CO2 22 03/03/2018 0714   GLUCOSE 118 (H) 03/03/2018 0714   BUN 6 (L) 03/03/2018 0714   CREATININE 0.76 03/03/2018 0714   CALCIUM 8.0 (L) 03/03/2018 0714   GFRNONAA >60 03/03/2018 0714   GFRAA >60 03/03/2018 0714    INR    Component Value Date/Time   INR 1.27 02/20/2018 0425     Intake/Output Summary (Last 24 hours) at 03/03/2018 1057 Last data filed at 03/03/2018 0911 Gross per 24 hour  Intake 2310.81 ml  Output 340 ml  Net 1970.81 ml     Assessment/Plan:  67 y.o. male is s/p left below knee amputation  1 Day Post-Op  - dressing left in place; surgery team will change tomorrow - continue current pain med regimen - d/c planning per primary team   Emilie Rutter, PA-C [facility] [phone] 03/03/2018 10:57 AM   I have seen and evaluated the patient. I agree with the PA note as documented above.  POD#1 s/p L BKA.  Will remove dressing tomorrow.  Cephus Shelling, MD [facility] [place] Office: [phone] Pager: [phone]

## 2018-03-03 NOTE — Plan of Care (Signed)
  Problem: Activity: Goal: Risk for activity intolerance will decrease Outcome: Progressing   Problem: Nutrition: Goal: Adequate nutrition will be maintained Outcome: Progressing   Problem: Pain Managment: Goal: General experience of comfort will improve Outcome: Progressing   Problem: Safety: Goal: Ability to remain free from injury will improve Outcome: Progressing   

## 2018-03-03 NOTE — Plan of Care (Signed)
  Problem: Education: Goal: Knowledge of General Education information will improve Description: Including pain rating scale, medication(s)/side effects and non-pharmacologic comfort measures Outcome: Progressing   Problem: Clinical Measurements: Goal: Ability to maintain clinical measurements within normal limits will improve Outcome: Progressing   

## 2018-03-03 NOTE — Progress Notes (Signed)
Pharmacy Antibiotic Note  Malik Stanton is a 67 y.o. male admitted on 02/28/2018 with cellulitis.  Pharmacy has been consulted for Vancomycin/Zosyn dosing.    Vanc trough came back therapeutic this PM at 13. Blood cultures remain neg. S/p BKA. MD stated to consider stopping abx 10/27.  Plan: Vancomycin 750 mg IV q12h Zosyn 3.375G IV q8h to be infused over 4 hours   Height: 5\' 10"  (177.8 cm) Weight: 125 lb 7.1 oz (56.9 kg) IBW/kg (Calculated) : 73  Temp (24hrs), Avg:98.1 F (36.7 C), Min:97.7 F (36.5 C), Max:98.6 F (37 C)  Recent Labs  Lab 02/28/18 2319 02/28/18 2331 03/01/18 0800 03/02/18 0358 03/03/18 0714 03/03/18 2113  WBC 16.0*  --  13.3* 12.9* 12.2*  --   CREATININE 0.76  --  0.65 0.71 0.76  --   LATICACIDVEN  --  1.86  --   --   --   --   VANCOTROUGH  --   --   --   --   --  13*    Estimated Creatinine Clearance: 73.1 mL/min (by C-G formula based on SCr of 0.76 mg/dL).    No Known Allergies  Ulyses Southward, PharmD, [place], AAHIVP, CPP Infectious Disease Pharmacist Pager: [phone] 03/03/2018 10:21 PM

## 2018-03-03 NOTE — Plan of Care (Signed)
  Problem: Education: Goal: Knowledge of General Education information will improve Description: Including pain rating scale, medication(s)/side effects and non-pharmacologic comfort measures Outcome: Progressing   Problem: Cardiovascular: Goal: Ability to achieve and maintain adequate cardiovascular perfusion will improve Outcome: Progressing   Problem: Clinical Measurements: Goal: Ability to maintain clinical measurements within normal limits will improve Outcome: Progressing   

## 2018-03-03 NOTE — Plan of Care (Signed)
  Problem: Education: Goal: Knowledge of General Education information will improve Description Including pain rating scale, medication(s)/side effects and non-pharmacologic comfort measures 03/03/2018 1838 by Sabino Donovan Outcome: Progressing 03/03/2018 1138 by Sabino Donovan Outcome: Progressing   Problem: Clinical Measurements: Goal: Ability to maintain clinical measurements within normal limits will improve Outcome: Progressing   Problem: Cardiovascular: Goal: Ability to achieve and maintain adequate cardiovascular perfusion will improve 03/03/2018 1838 by Sabino Donovan Outcome: Progressing 03/03/2018 1153 by Sabino Donovan Outcome: Progressing

## 2018-03-03 NOTE — Progress Notes (Signed)
Notified MD and pt of Hemoglobin result. Pt is A+Ox1 and refusing blood. Spoke with granddaugher Malvin Johns who then spoke to pt. Pt stated: "If I need it, I 'll take it". Granddaughter was gave consent via telephone witnessed by second nurse Ocie Cornfield. Will continue with blood administration process.

## 2018-03-03 NOTE — Evaluation (Signed)
Physical Therapy Evaluation Patient Details Name: Dedric Ethington MRN: [id number] DOB: 05-05-1951 Today's Date: 03/03/2018   History of Present Illness  Ulrich Soules a 67 y.o.malewith medical history significant fordementia, hypertension, and peripheral arterial disease status post stent to the left external iliac artery and left SFA earlier this month (02/20/18), now presenting to the emergency department for evaluation of worsening ulcerations involving the distal left leg. now s/p L BKA.  Clinical Impression  Patient is s/p above surgery resulting in functional limitations due to the deficits listed below (see PT Problem List). Pt currently limited in safe mobility by decreased cognition and safety awareness, as well as decreased strength and endurance. Pt currently supervision for bed mobility, and minAx2 for transfers and lateral hopping of 3 feet with RW. Patient will benefit from skilled PT to increase their independence and safety with mobility to allow discharge to the venue listed below.       Follow Up Recommendations SNF    Equipment Recommendations  None recommended by PT    Recommendations for Other Services       Precautions / Restrictions Precautions Precautions: Fall Restrictions Weight Bearing Restrictions: Yes LLE Weight Bearing: Non weight bearing      Mobility  Bed Mobility Overal bed mobility: Needs Assistance Bed Mobility: Supine to Sit;Sit to Supine     Supine to sit: Supervision;HOB elevated Sit to supine: Supervision   General bed mobility comments: pt moved quickly to seated EoB with supervision for safety, able to manage R LE into bed without assist, assist of 2 to scoot pt up in bed   Transfers Overall transfer level: Needs assistance Equipment used: Rolling walker (2 wheeled) Transfers: Sit to/from Stand Sit to Stand: Min assist;+2 physical assistance         General transfer comment: minAx2 for powerup and steadying with RW, vc for  hand placement which he did not follow,   Ambulation/Gait Ambulation/Gait assistance: Min assist;+2 physical assistance Gait Distance (Feet): 3 Feet Assistive device: Rolling walker (2 wheeled) Gait Pattern/deviations: Step-to pattern;Trunk flexed Gait velocity: Decreased Gait velocity interpretation: <1.31 ft/sec, indicative of household ambulator General Gait Details: minAx2 for steadying with hopping to R up to the HoB,         Balance Overall balance assessment: Needs assistance Sitting-balance support: Feet supported Sitting balance-Leahy Scale: Fair     Standing balance support: Bilateral upper extremity supported;Single extremity supported;During functional activity Standing balance-Leahy Scale: Poor Standing balance comment: requires RW and therapist assist                              Pertinent Vitals/Pain Pain Assessment: No/denies pain    Home Living Family/patient expects to be discharged to:: Private residence Living Arrangements: Alone Available Help at Discharge: Family;Available PRN/intermittently(grandaughter assists with shopping, bathing ) Type of Home: House Home Access: Stairs to enter Entrance Stairs-Rails: None Entrance Stairs-Number of Steps: 3 Home Layout: One level Home Equipment: Walker - 2 wheels;Shower seat Additional Comments: h/o obtained from pt, no family present, not consistent with prior hospitalization     Prior Function Level of Independence: Needs assistance   Gait / Transfers Assistance Needed: ambulated household distances with Rw  ADL's / Homemaking Assistance Needed: grandaughter assist with shopping, cooking, bathing           Extremity/Trunk Assessment   Upper Extremity Assessment Upper Extremity Assessment: Defer to OT evaluation    Lower Extremity Assessment Lower Extremity Assessment: LLE deficits/detail;Generalized weakness  LLE Deficits / Details: L BKA, hip ROM WFL    Cervical / Trunk  Assessment Cervical / Trunk Assessment: Kyphotic  Communication   Communication: Other (comment)(delayed responses, baseline from prior hospitalization )  Cognition Arousal/Alertness: Awake/alert Behavior During Therapy: Flat affect Overall Cognitive Status: No family/caregiver present to determine baseline cognitive functioning                                 General Comments: Slowed responses, follows majority of simple/one-step commands      General Comments General comments (skin integrity, edema, etc.): during session BP 162/59m max HR 110 bpm, SaO2 on RA >93%O2        Assessment/Plan    PT Assessment Patient needs continued PT services  PT Problem List Decreased strength;Decreased range of motion;Decreased activity tolerance;Decreased balance;Decreased mobility;Decreased cognition;Decreased safety awareness       PT Treatment Interventions DME instruction;Gait training;Stair training;Functional mobility training;Therapeutic activities;Therapeutic exercise;Balance training;Cognitive remediation;Patient/family education    PT Goals (Current goals can be found in the Care Plan section)  Acute Rehab PT Goals Patient Stated Goal: none stated PT Goal Formulation: With patient Time For Goal Achievement: 03/17/18 Potential to Achieve Goals: Good    Frequency Min 3X/week   Barriers to discharge Decreased caregiver support      Co-evaluation PT/OT/SLP Co-Evaluation/Treatment: Yes Reason for Co-Treatment: Complexity of the patient's impairments (multi-system involvement);For patient/therapist safety PT goals addressed during session: Mobility/safety with mobility         AM-PAC PT "6 Clicks" Daily Activity  Outcome Measure Difficulty turning over in bed (including adjusting bedclothes, sheets and blankets)?: None Difficulty moving from lying on back to sitting on the side of the bed? : A Little Difficulty sitting down on and standing up from a chair with  arms (e.g., wheelchair, bedside commode, etc,.)?: Unable Help needed moving to and from a bed to chair (including a wheelchair)?: A Lot Help needed walking in hospital room?: A Lot Help needed climbing 3-5 steps with a railing? : Total 6 Click Score: 13    End of Session Equipment Utilized During Treatment: Gait belt Activity Tolerance: Patient tolerated treatment well Patient left: in bed;with call bell/phone within reach;with bed alarm set Nurse Communication: Mobility status PT Visit Diagnosis: Unsteadiness on feet (R26.81);Other abnormalities of gait and mobility (R26.89);Muscle weakness (generalized) (M62.81);Difficulty in walking, not elsewhere classified (R26.2)    Time: 4098-1191 PT Time Calculation (min) (ACUTE ONLY): 21 min   Charges:   PT Evaluation $PT Eval Moderate Complexity: 1 Mod          Dezaria Methot B. Beverely Risen PT, DPT Acute Rehabilitation Services Pager [phone] Office [phone]   Elon Alas Fleet 03/03/2018, 1:29 PM

## 2018-03-03 NOTE — Evaluation (Signed)
Occupational Therapy Evaluation Patient Details Name: Malik Stanton MRN: [id number] DOB: Apr 12, 1951 Today's Date: 03/03/2018    History of Present Illness Malik Stanton a 67 y.o.malewith medical history significant fordementia, hypertension, and peripheral arterial disease status post stent to the left external iliac artery and left SFA earlier this month (02/20/18), now presenting to the emergency department for evaluation of worsening ulcerations involving the distal left leg. now s/p L BKA.   Clinical Impression   PTA Pt was mod I for mobility and ADL (reports sponge bathing). Pt is currently limited by decreased activity tolerance, cognition (no family there to confirm baseline) balance, safety awareness, and new amputation. Pt will benefit from skilled OT in the acute setting as well as afterwards at the SNF level. Next session to focus on OOB transfer as well as LB ADL with new balance.     Follow Up Recommendations  SNF;Supervision/Assistance - 24 hour    Equipment Recommendations  Other (comment)(defer to next session)    Recommendations for Other Services       Precautions / Restrictions Precautions Precautions: Fall Restrictions Weight Bearing Restrictions: Yes LLE Weight Bearing: Non weight bearing      Mobility Bed Mobility Overal bed mobility: Needs Assistance Bed Mobility: Supine to Sit;Sit to Supine     Supine to sit: Supervision;HOB elevated Sit to supine: Supervision   General bed mobility comments: pt moved quickly to seated EoB with supervision for safety, able to manage R LE into bed without assist, assist of 2 to scoot pt up in bed   Transfers Overall transfer level: Needs assistance Equipment used: Rolling walker (2 wheeled) Transfers: Sit to/from Stand Sit to Stand: Min assist;+2 physical assistance         General transfer comment: minAx2 for powerup and steadying with RW, vc for hand placement which he did not follow,     Balance  Overall balance assessment: Needs assistance Sitting-balance support: Feet supported Sitting balance-Leahy Scale: Fair     Standing balance support: Bilateral upper extremity supported;Single extremity supported;During functional activity Standing balance-Leahy Scale: Poor Standing balance comment: requires RW and therapist assist                            ADL either performed or assessed with clinical judgement   ADL Overall ADL's : Needs assistance/impaired Eating/Feeding: Set up;Sitting   Grooming: Set up;Sitting Grooming Details (indicate cue type and reason): only able to perform in seated position Upper Body Bathing: Set up;Sitting   Lower Body Bathing: Min guard;Sitting/lateral leans Lower Body Bathing Details (indicate cue type and reason): min guard for safety with balance Upper Body Dressing : Set up;Sitting   Lower Body Dressing: Maximal assistance;Bed level   Toilet Transfer: Minimal assistance;+2 for physical assistance;+2 for safety/equipment   Toileting- Clothing Manipulation and Hygiene: Moderate assistance;Sit to/from stand;+2 for physical assistance;+2 for safety/equipment         General ADL Comments: edcuated Pt on densensitization of residual limb for potential phantom pains     Vision         Perception     Praxis      Pertinent Vitals/Pain Pain Assessment: No/denies pain(checked several times)     Hand Dominance     Extremity/Trunk Assessment Upper Extremity Assessment Upper Extremity Assessment: Overall WFL for tasks assessed   Lower Extremity Assessment Lower Extremity Assessment: Defer to PT evaluation LLE Deficits / Details: L BKA, hip ROM WFL   Cervical / Trunk Assessment  Cervical / Trunk Assessment: Kyphotic   Communication Communication Communication: Other (comment)(delayed responses, baseline from prior hospitalization )   Cognition Arousal/Alertness: Awake/alert Behavior During Therapy: Flat affect Overall  Cognitive Status: No family/caregiver present to determine baseline cognitive functioning                                 General Comments: Slowed responses, follows majority of simple/one-step commands   General Comments  during session BP 162/1m max HR 110 bpm, SaO2 on RA >93%O2    Exercises     Shoulder Instructions      Home Living Family/patient expects to be discharged to:: Private residence Living Arrangements: Alone Available Help at Discharge: Family;Available PRN/intermittently(grandaughter assists with shopping, bathing ) Type of Home: House Home Access: Stairs to enter [employer] of Steps: 3 Entrance Stairs-Rails: None Home Layout: One level     Bathroom Shower/Tub: Producer, television/film/video: Standard     Home Equipment: Environmental consultant - 2 wheels;Shower seat   Additional Comments: h/o obtained from pt, no family present, not consistent with prior hospitalization       Prior Functioning/Environment Level of Independence: Needs assistance  Gait / Transfers Assistance Needed: ambulated household distances with Rw ADL's / Homemaking Assistance Needed: grandaughter assist with shopping, cooking, bathing            OT Problem List: Decreased activity tolerance;Impaired balance (sitting and/or standing);Decreased knowledge of use of DME or AE;Decreased knowledge of precautions;Pain;Cardiopulmonary status limiting activity;Decreased cognition      OT Treatment/Interventions: Self-care/ADL training;DME and/or AE instruction;Therapeutic activities;Patient/family education;Balance training;Cognitive remediation/compensation    OT Goals(Current goals can be found in the care plan section) Acute Rehab OT Goals Patient Stated Goal: none stated Time For Goal Achievement: 03/17/18 Potential to Achieve Goals: Good ADL Goals Pt Will Perform Lower Body Bathing: with modified independence;sitting/lateral leans Pt Will Perform Lower Body  Dressing: with min guard assist;sitting/lateral leans Pt Will Transfer to Toilet: with supervision;stand pivot transfer;bedside commode Pt Will Perform Toileting - Clothing Manipulation and hygiene: sit to/from stand;with mod assist  OT Frequency: Min 2X/week   Barriers to D/C:    no family present throughout session       Co-evaluation PT/OT/SLP Co-Evaluation/Treatment: Yes Reason for Co-Treatment: Complexity of the patient's impairments (multi-system involvement);For patient/therapist safety;To address functional/ADL transfers PT goals addressed during session: Mobility/safety with mobility;Balance;Proper use of DME OT goals addressed during session: ADL's and self-care;Proper use of Adaptive equipment and DME      AM-PAC PT "6 Clicks" Daily Activity     Outcome Measure Help from another person eating meals?: None Help from another person taking care of personal grooming?: None(in sitting) Help from another person toileting, which includes using toliet, bedpan, or urinal?: A Lot Help from another person bathing (including washing, rinsing, drying)?: A Lot Help from another person to put on and taking off regular upper body clothing?: None Help from another person to put on and taking off regular lower body clothing?: A Lot 6 Click Score: 18   End of Session Equipment Utilized During Treatment: Gait belt;Rolling walker Nurse Communication: Mobility status;Precautions;Weight bearing status  Activity Tolerance: Patient tolerated treatment well Patient left: in bed;with call bell/phone within reach;with bed alarm set;with SCD's reapplied  OT Visit Diagnosis: Unsteadiness on feet (R26.81);Muscle weakness (generalized) (M62.81);Other symptoms and signs involving cognitive function;Adult, failure to thrive (R62.7)  Time: 1610-9604 OT Time Calculation (min): 21 min Charges:  OT General Charges $OT Visit: 1 Visit OT Evaluation $OT Eval Moderate Complexity: 1  Mod  Sherryl Manges OTR/L Acute Rehabilitation Services Pager: [phone] Office: [phone]  Evern Bio Sterlin Knightly 03/03/2018, 6:17 PM

## 2018-03-04 LAB — TYPE AND SCREEN
ABO/RH(D): A POS
Antibody Screen: NEGATIVE
Unit division: 0

## 2018-03-04 LAB — BASIC METABOLIC PANEL
ANION GAP: 8 (ref 5–15)
BUN: <5 mg/dL — ABNORMAL LOW (ref 8–23)
CALCIUM: 8.3 mg/dL — AB (ref 8.9–10.3)
CO2: 22 mmol/L (ref 22–32)
Chloride: 108 mmol/L (ref 98–111)
Creatinine, Ser: 0.79 mg/dL (ref 0.61–1.24)
GFR calc Af Amer: >60 mL/min (ref >60)
Glucose, Bld: 107 mg/dL — ABNORMAL HIGH (ref 70–99)
POTASSIUM: 3.6 mmol/L (ref 3.5–5.1)
SODIUM: 138 mmol/L (ref 135–145)

## 2018-03-04 LAB — CBC
HCT: 27.8 % — ABNORMAL LOW (ref 39.0–52.0)
HEMOGLOBIN: 9.3 g/dL — AB (ref 13.0–17.0)
MCH: 27.2 pg (ref 26.0–34.0)
MCHC: 33.5 g/dL (ref 30.0–36.0)
MCV: 81.3 fL (ref 80.0–100.0)
PLATELETS: 570 10*3/uL — AB (ref 150–400)
RBC: 3.42 MIL/uL — AB (ref 4.22–5.81)
RDW: 16.5 % — ABNORMAL HIGH (ref 11.5–15.5)
WBC: 10.9 10*3/uL — AB (ref 4.0–10.5)
nRBC: 0 % (ref 0.0–0.2)

## 2018-03-04 LAB — BPAM RBC
BLOOD PRODUCT EXPIRATION DATE: 201911182359
ISSUE DATE / TIME: 201910261126
Unit Type and Rh: 6200

## 2018-03-04 NOTE — NC FL2 (Signed)
[place] MEDICAID FL2 LEVEL OF CARE SCREENING TOOL     IDENTIFICATION  Patient Name: Malik Stanton Birthdate: Sep 27, 1950 Sex: male Admission Date (Current Location): 02/28/2018  [facility] and [place] Number:  Producer, television/film/video and Address:  [facility], [address], [place], [place] [address]      Provider Number: [id number]  Attending Physician Name and Address:  Alba Cory, MD  Relative Name and Phone Number:       Current Level of Care: Hospital Recommended Level of Care: Skilled Nursing Facility Prior Approval Number:    Date Approved/Denied:   PASRR Number:    Discharge Plan: SNF    Current Diagnoses: Patient Active Problem List   Diagnosis Date Noted  . Essential hypertension 03/01/2018  . PVD (peripheral vascular disease) (HCC) 03/01/2018  . Gangrene of left foot (HCC) 03/01/2018  . Normocytic anemia 03/01/2018  . 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 Critical lower limb ischemia 02/18/2018  . Hypokalemia 02/18/2018    Orientation RESPIRATION BLADDER Height & Weight     Self, Time, Situation, Place  Normal Continent Weight: 125 lb 7.1 oz (56.9 kg) Height:  5\' 10"  (177.8 cm)  BEHAVIORAL SYMPTOMS/MOOD NEUROLOGICAL BOWEL NUTRITION STATUS        Diet(Heart healthy)  AMBULATORY STATUS COMMUNICATION OF NEEDS Skin   Limited Assist Verbally Other (Comment)(Closed incision- left leg; 2 non pressure wounds left lower leg, left ankle, left foot; puncture wound right groin. )                       Personal Care Assistance Level of Assistance  Bathing, Feeding, Dressing Bathing Assistance: Limited assistance Feeding assistance: Independent Dressing Assistance: Limited assistance     Functional Limitations Info             SPECIAL CARE FACTORS FREQUENCY  PT (By licensed PT), OT (By licensed OT)     PT Frequency: 5 OT Frequency: 5            Contractures      Additional  Factors Info  Code Status, Allergies Code Status Info: Full code  Allergies Info: NKA            Current Medications (03/04/2018):  This is the current hospital active medication list Current Facility-Administered Medications  Medication Dose Route Frequency Provider Last Rate Last Dose  . acetaminophen (TYLENOL) tablet 650 mg  650 mg Oral Q6H PRN Emilie Rutter, PA-C       Or  . acetaminophen (TYLENOL) suppository 650 mg  650 mg Rectal Q6H PRN Emilie Rutter, PA-C      . Chlorhexidine Gluconate Cloth 2 % PADS 6 each  6 each Topical Q0600 Emilie Rutter, PA-C   6 each at 03/03/18 0509  . clopidogrel (PLAVIX) tablet 75 mg  75 mg Oral Daily Emilie Rutter, PA-C   75 mg at 03/04/18 6295  . ferrous sulfate tablet 325 mg  325 mg Oral BID WC Emilie Rutter, PA-C   325 mg at 03/04/18 2841  . guaiFENesin-dextromethorphan (ROBITUSSIN DM) 100-10 MG/5ML syrup 15 mL  15 mL Oral Q4H PRN Emilie Rutter, PA-C      . HYDROcodone-acetaminophen (NORCO/VICODIN) 5-325 MG per tablet 1-2 tablet  1-2 tablet Oral Q4H PRN Emilie Rutter, PA-C   2 tablet at 03/02/18 3244  . lactated ringers infusion   Intravenous Continuous Emilie Rutter, PA-C      . metoprolol succinate (TOPROL-XL) 24 hr  tablet 25 mg  25 mg Oral Daily Emilie Rutter, PA-C   25 mg at 03/04/18 1610  . morphine 2 MG/ML injection 2 mg  2 mg Intravenous Q2H PRN Emilie Rutter, PA-C      . mupirocin ointment (BACTROBAN) 2 % 1 application  1 application Nasal BID Emilie Rutter, PA-C   1 application at 03/04/18 9604  . ondansetron (ZOFRAN) tablet 4 mg  4 mg Oral Q6H PRN Emilie Rutter, PA-C       Or  . ondansetron (ZOFRAN) injection 4 mg  4 mg Intravenous Q6H PRN Emilie Rutter, PA-C      . phenol (CHLORASEPTIC) mouth spray 1 spray  1 spray Mouth/Throat PRN Emilie Rutter, PA-C      . piperacillin-tazobactam (ZOSYN) IVPB 3.375 g  3.375 g Intravenous Q8H Emilie Rutter, PA-C 12.5 mL/hr at 03/04/18 0452 3.375 g at 03/04/18  0452  . polyethylene glycol (MIRALAX / GLYCOLAX) packet 17 g  17 g Oral BID Emilie Rutter, PA-C   17 g at 03/03/18 2059  . vancomycin (VANCOCIN) IVPB 750 mg/150 ml premix  750 mg Intravenous Q12H Emilie Rutter, PA-C 150 mL/hr at 03/04/18 0925 750 mg at 03/04/18 0925  . vitamin B-12 (CYANOCOBALAMIN) tablet 1,000 mcg  1,000 mcg Oral Daily Regalado, Belkys A, MD   1,000 mcg at 03/04/18 5409     Discharge Medications: Please see discharge summary for a list of discharge medications.  Relevant Imaging Results:  Relevant Lab Results:   Additional Information SS#: [id number]  Donnie Coffin, LCSW

## 2018-03-04 NOTE — Plan of Care (Signed)
  Problem: Clinical Measurements: Goal: Ability to maintain clinical measurements within normal limits will improve Outcome: Progressing   Problem: Nutrition: Goal: Adequate nutrition will be maintained Outcome: Progressing   Problem: Elimination: Goal: Will not experience complications related to bowel motility Outcome: Progressing Goal: Will not experience complications related to urinary retention Outcome: Progressing   Problem: Pain Managment: Goal: General experience of comfort will improve Outcome: Progressing   Problem: Safety: Goal: Ability to remain free from injury will improve Outcome: Progressing   Problem: Skin Integrity: Goal: Risk for impaired skin integrity will decrease Outcome: Progressing

## 2018-03-04 NOTE — Clinical Social Work Note (Signed)
Clinical Social Work Assessment  Patient Details  Name: Malik Stanton MRN: [id number] Date of Birth: Apr 23, 1951  Date of referral:  03/04/18               Reason for consult:  Facility Placement                Permission sought to share information with:    Permission granted to share information::     Name::        Agency::     Relationship::     Contact Information:     Housing/Transportation Living arrangements for the past 2 months:  Single Family Home Source of Information:  Adult Children(Malik Jr. ) Patient Interpreter Needed:  None Criminal Activity/Legal Involvement Pertinent to Current Situation/Hospitalization:    Significant Relationships:  Adult Children Lives with:  Adult Children Do you feel safe going back to the place where you live?  Yes Need for family participation in patient care:  Yes (Comment)  Care giving concerns:  No concerns at this time.    Social Worker assessment / plan:  CSW spoke with patients son, Malik Stanton., regarding disposition plans. PT is currently recommending SNF placement at time of discharge. Patients son is agreeable to this plan and had no questions/ concerns. [place]/ [place] and would prefer to go to a facility in that area but understand there are more options in the [place] area- son agreeable to look into facilities in the [place] and [place] area.   CSW will complete needed information and fax out to [facility] and [employer].  Employment status:  Retired Database administrator PT Recommendations:  Skilled Nursing Facility Information / Referral to community resources:  Skilled Nursing Facility  Patient/Family's Response to care:  Son appreciated CSW.   Patient/Family's Understanding of and Emotional Response to Diagnosis, Current Treatment, and Prognosis:  Son understands current disposition plan.   Emotional Assessment Appearance:    Attitude/Demeanor/Rapport:    Affect  (typically observed):  Unable to Assess Orientation:  Fluctuating Orientation (Suspected and/or reported Sundowners) Alcohol / Substance use:    Psych involvement (Current and /or in the community):  No (Comment)  Discharge Needs  Concerns to be addressed:  No discharge needs identified Readmission within the last 30 days:  Yes Current discharge risk:  None Barriers to Discharge:  Insurance Authorization   Donnie Coffin, LCSW 03/04/2018, 12:16 PM

## 2018-03-04 NOTE — Progress Notes (Signed)
PROGRESS NOTE    Malik Stanton  ZOX:[id number] DOB: 03-23-1951 DOA: 02/28/2018 PCP: [facility]    Brief Narrative: Malik Stanton is a 67 y.o. male with medical history significant for dementia, hypertension, and peripheral arterial disease status post stent to the left external iliac artery and left SFA earlier this month, now presenting to the emergency department for evaluation of worsening ulcerations involving the distal left leg.  Patient's family reportedly became concerned with acute worsening and distal left leg wounds and sent patient to the ED.  He had been admitted earlier this month with critical limb ischemia and underwent successful stenting on 02/20/2018.  Family is not available at this time, but had reported to ED personnel that the wound had worsened acutely.  Patient denies pain at this time but has already been treated with fentanyl prior to exam.  Patient has no complaints.    [facility] ED Course: Upon arrival to the ED, patient is found to be afebrile, saturating well on room air, and with vitals otherwise stable.  Chemistry panel is notable for slight hyponatremia and hypokalemia.  CBC features and increased leukocytosis to 16,000, increasing thrombocytosis now 652,000, and a stable normocytic anemia with hemoglobin 9.9.  Lactic acid is reassuring at 1.86.  Radiographs are negative for gas or osteomyelitis.  Blood cultures were collected and patient was given a liter of normal saline, fentanyl, vancomycin, and Zosyn.  Vascular surgery was consulted by the ED physician and transfer to [facility] for admission was arranged.  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 In the ED no pulse detected with doppler.  Patient was started on IV antibiotics.  Pain management.  Discussed with Dr Randie Heinz, patient needs amputation. He will speak with family. Also patient doesn't need IV heparin.  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  Will stop antibiotics.   2-Gangrene lower extremity;  Large LLE ulceration, necrosis, progress in size per family.  Started on IV antibiotics.  Vascular consulted.  S/P below knee amputation 10-25 WBC normalized. Stop IV antibiotics.    3-Hypokalemia;  Labs pending for the morning.   Resolved.    Anemia; Acute blood loss.  Iron deficiency anemia;  Started  ferrous sulfate.  Transfuse one unit.  Hb improved.   HTN; continue with Toprol. Hold Norvasc.  Leukocytosis; in setting of infection.  IV antibiotics.  Follow blood culture,. No growth to date,   DVT prophylaxis: heparin  Code Status: full code.  Family Communication: no family at bedside.  Disposition Plan: monitor hb, SNF tomorrow if bed available.   Consultants:   Dr Randie Heinz.    Procedures:   none   Antimicrobials:  Vancomycin   Zosyn,    Subjective: Pain controlled. No complaints.   Objective: Vitals:   03/03/18 1155 03/03/18 1357 03/03/18 1931 03/04/18 0348  BP: (!) 152/78 132/78 (!) 165/89 (!) 149/87  Pulse: 89 79 91 94  Resp: 16 18 18 17   Temp: 98.1 F (36.7 C) 98.5 F (36.9 C) 97.9 F (36.6 C) 98.4 F (36.9 C)  TempSrc: Oral Oral Oral Oral  SpO2:  98% 97% 100%  Weight:  Height:        Intake/Output Summary (Last 24 hours) at 03/04/2018 1345 Last data filed at 03/04/2018 0700 Gross per 24 hour  Intake 1115.25 ml  Output 4250 ml  Net -3134.75 ml   Filed Weights   02/28/18 2103  Weight: 56.9 kg    Examination:  General exam: NAD Respiratory system: CTA Cardiovascular system: S 1, S 2 RRR Gastrointestinal system; BS present, soft,  Central nervous  system: Alert,  Extremities: S/P Left LE below knee amputation    Data Reviewed: I have personally reviewed following labs and imaging studies  CBC: Recent Labs  Lab 02/28/18 2319 03/01/18 0800 03/02/18 0358 03/03/18 0714 03/04/18 0332  WBC 16.0* 13.3* 12.9* 12.2* 10.9*  NEUTROABS 11.6*  --   --   --   --   HGB 9.9* 8.1* 8.6* 7.0* 9.3*  HCT 30.7* 25.2* 27.0* 21.4* 27.8*  MCV 82.7 80.8 81.1 82.0 81.3  PLT 652* 562* 589* 490* 570*   Basic Metabolic Panel: Recent Labs  Lab 02/28/18 2319 03/01/18 0800 03/02/18 0358 03/03/18 0714 03/04/18 0332  NA 134* 137 136 137 138  K 3.4* 3.1* 3.1* 4.0 3.6  CL 101 109 105 109 108  CO2 22 23 23 22 22   GLUCOSE 120* 106* 98 118* 107*  BUN 11 5* 5* 6* <5*  CREATININE 0.76 0.65 0.71 0.76 0.79  CALCIUM 8.6* 8.0* 8.1* 8.0* 8.3*  MG  --   --   --  1.8  --    GFR: Estimated Creatinine Clearance: 73.1 mL/min (by C-G formula based on SCr of 0.79 mg/dL). Liver Function Tests: No results for input(s): AST, ALT, ALKPHOS, BILITOT, PROT, ALBUMIN in the last 168 hours. No results for input(s): LIPASE, AMYLASE in the last 168 hours. No results for input(s): AMMONIA in the last 168 hours. Coagulation Profile: No results for input(s): INR, PROTIME in the last 168 hours. Cardiac Enzymes: Recent Labs  Lab 02/28/18 2319  CKTOTAL 59   BNP (last 3 results) No results for input(s): PROBNP in the last 8760 hours. HbA1C: No results for input(s): HGBA1C in the last 72 hours. CBG: No results for input(s): GLUCAP in the last 168 hours. Lipid Profile: No results for input(s): CHOL, HDL, LDLCALC, TRIG, CHOLHDL, LDLDIRECT in the last 72 hours. Thyroid Function Tests: No results for input(s): TSH, T4TOTAL, FREET4, T3FREE, THYROIDAB in the last 72 hours. Anemia Panel: Recent Labs    03/02/18 0358  VITAMINB12 264  FOLATE 8.2  FERRITIN 156  TIBC 123*  IRON 14*  RETICCTPCT 1.7   Sepsis Labs: Recent Labs  Lab 02/28/18 2331  LATICACIDVEN 1.86     Recent Results (from the past 240 hour(s))  Blood culture (routine x 2)     Status: None (Preliminary result)   Collection Time: 02/28/18 11:22 PM  Result Value Ref Range Status   Specimen Description   Final    BLOOD RIGHT ANTECUBITAL Performed at [facility], [address] [place], [place] [address]    Special Requests   Final    BOTTLES DRAWN AEROBIC AND ANAEROBIC Blood Culture adequate volume Performed at [facility], [address] [place], [place] [address]    Culture   Final    NO GROWTH 2 DAYS Performed at [facility], [address], [place], [place] [address]    Report Status PENDING  Incomplete  Blood culture (routine x 2)     Status: None (Preliminary result)   Collection  Time: 02/28/18 11:40 PM  Result Value Ref Range Status   Specimen Description   Final    BLOOD LEFT ANTECUBITAL Performed at [facility], [address] [place], [place] [address]    Special Requests   Final    BOTTLES DRAWN AEROBIC AND ANAEROBIC Blood Culture adequate volume Performed at [facility], [address] [place], [place] [address]    Culture   Final    NO GROWTH 2 DAYS Performed at [facility], [address] [place], [place] [address]    Report Status PENDING  Incomplete  Surgical pcr screen     Status: Abnormal   Collection Time: 03/02/18  3:39 AM  Result Value Ref Range Status   MRSA, PCR POSITIVE (A) NEGATIVE Final    Comment: RESULT CALLED TO, READ BACK BY AND VERIFIED WITH: RN R ARCILLA 03/02/18 AT 804 AM BY CM    Staphylococcus aureus POSITIVE (A) NEGATIVE Final    Comment: RESULT CALLED TO, READ BACK BY AND VERIFIED WITH: RN R ARCILLA 03/02/18 AT 804 AM BY CM (NOTE) The Xpert SA Assay (FDA approved for NASAL specimens in patients 66 years of age and older), is one component of a comprehensive surveillance program. It is not intended to diagnose infection nor to guide or monitor  treatment. Performed at [facility], [address] [place], [place] [address]          Radiology Studies: No results found.      Scheduled Meds: . Chlorhexidine Gluconate Cloth  6 each Topical Q0600  . clopidogrel  75 mg Oral Daily  . ferrous sulfate  325 mg Oral BID WC  . metoprolol succinate  25 mg Oral Daily  . mupirocin ointment  1 application Nasal BID  . polyethylene glycol  17 g Oral BID  . vitamin B-12  1,000 mcg Oral Daily   Continuous Infusions: . lactated ringers    . vancomycin 750 mg (03/04/18 0925)     LOS: 3 days    Time spent: 35 minutes.     Alba Cory, MD [facility] Pager [phone]  If 7PM-7AM, please contact night-coverage [email or web address] Password [email or web address] 03/04/2018, 1:45 PM

## 2018-03-04 NOTE — Progress Notes (Signed)
[facility]  Subjective  - Doing well POD#2 s/p L BKA.  No complaints this am.    Objective (!) 149/87 94 98.4 F (36.9 C) (Oral) 17 100%  Intake/Output Summary (Last 24 hours) at 03/04/2018 0956 Last data filed at 03/04/2018 0700 Gross per 24 hour  Intake 1115.25 ml  Output 4600 ml  Net -3484.75 ml    General: resting, NAD L BKA c/d/i, staples with no drainage  Laboratory Lab Results: Recent Labs    03/03/18 0714 03/04/18 0332  WBC 12.2* 10.9*  HGB 7.0* 9.3*  HCT 21.4* 27.8*  PLT 490* 570*   BMET Recent Labs    03/03/18 0714 03/04/18 0332  NA 137 138  K 4.0 3.6  CL 109 108  CO2 22 22  GLUCOSE 118* 107*  BUN 6* <5*  CREATININE 0.76 0.79  CALCIUM 8.0* 8.3*    COAG Lab Results  Component Value Date   INR 1.27 02/20/2018   INR 1.33 02/17/2018   No results found for: PTT  Assessment/Planning:  Doing well POD#2 s/p L BKA.  Dressing removed today and BKA c/d/i.  Agree with engaging rehab and PT for placement. Call us if there are questions or concerns with the stump.  Will arrange wound check and staple removal in 4 weeks.   Malik Stanton 03/04/2018 9:56 AM --

## 2018-03-05 LAB — CBC
HEMATOCRIT: 29.6 % — AB (ref 39.0–52.0)
HEMOGLOBIN: 9.6 g/dL — AB (ref 13.0–17.0)
MCH: 27.1 pg (ref 26.0–34.0)
MCHC: 32.4 g/dL (ref 30.0–36.0)
MCV: 83.6 fL (ref 80.0–100.0)
Platelets: 542 10*3/uL — ABNORMAL HIGH (ref 150–400)
RBC: 3.54 MIL/uL — ABNORMAL LOW (ref 4.22–5.81)
RDW: 17.1 % — ABNORMAL HIGH (ref 11.5–15.5)
WBC: 12.1 10*3/uL — ABNORMAL HIGH (ref 4.0–10.5)
nRBC: 0 % (ref 0.0–0.2)

## 2018-03-05 NOTE — Progress Notes (Signed)
Pasrr: [id number]   Cristobal Goldmann LCSW [phone]

## 2018-03-05 NOTE — Progress Notes (Signed)
Inpatient Rehabilitation Admissions Coordinator  Noted both PT and OT are recommending SNF and SW has begun SNF placement. I contacted Morrie Sheldon, SW and we will sign off at this time. Please call me with any questions.  Ottie Glazier, RN, MSN Rehab Admissions Coordinator [phone] 03/05/2018 12:21 PM

## 2018-03-05 NOTE — Progress Notes (Signed)
CSW spoke with granddaughter Sonia Side who reports they would like to accept [facility] bed offer, CSW contacted [facility] who will be starting insurance authorization today.   [place], [place] [phone]

## 2018-03-05 NOTE — Progress Notes (Signed)
PROGRESS NOTE    Kahlel Peake  ZOX:[id number] DOB: 05-22-1950 DOA: 02/28/2018 PCP: [facility]    Brief Narrative: Zinedine Ellner is a 67 y.o. male with medical history significant for dementia, hypertension, and peripheral arterial disease status post stent to the left external iliac artery and left SFA earlier this month, now presenting to the emergency department for evaluation of worsening ulcerations involving the distal left leg.  Patient's family reportedly became concerned with acute worsening and distal left leg wounds and sent patient to the ED.  He had been admitted earlier this month with critical limb ischemia and underwent successful stenting on 02/20/2018.  Family is not available at this time, but had reported to ED personnel that the wound had worsened acutely.  Patient denies pain at this time but has already been treated with fentanyl prior to exam.  Patient has no complaints.    [facility] ED Course: Upon arrival to the ED, patient is found to be afebrile, saturating well on room air, and with vitals otherwise stable.  Chemistry panel is notable for slight hyponatremia and hypokalemia.  CBC features and increased leukocytosis to 16,000, increasing thrombocytosis now 652,000, and a stable normocytic anemia with hemoglobin 9.9.  Lactic acid is reassuring at 1.86.  Radiographs are negative for gas or osteomyelitis.  Blood cultures were collected and patient was given a liter of normal saline, fentanyl, vancomycin, and Zosyn.  Vascular surgery was consulted by the ED physician and transfer to [facility] for admission was arranged.  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 In the ED no pulse detected with doppler.  Patient was started on IV antibiotics.  Pain management.  Discussed with Dr Randie Heinz, patient needs amputation. He will speak with family. Also patient doesn't need IV heparin.  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   stop antibiotics.  Stable.   2-Gangrene lower extremity;  Large LLE ulceration, necrosis, progress in size per family.  Started on IV antibiotics.  Vascular consulted.  S/P below knee amputation 10-25 WBC normalized. Stop IV antibiotics.    3-Hypokalemia;  Labs pending for the morning.   Resolved.    Anemia; Acute blood loss.  Iron deficiency anemia;  Started  ferrous sulfate.  Transfuse one unit.  Hb improved.   HTN; continue with Toprol. Hold Norvasc.  Leukocytosis; in setting of infection.  IV antibiotics.  Follow blood culture,. No growth to date,   DVT prophylaxis: heparin  Code Status: full code.  Family Communication: no family at bedside.  Disposition Plan: awaiting insurance approval for SNF Consultants:   Dr Randie Heinz.    Procedures:   none   Antimicrobials:  Vancomycin   Zosyn,    Subjective: No complaints   Objective: Vitals:   03/04/18 2158 03/05/18 0514 03/05/18 1002 03/05/18 1317  BP: (!) 143/94 123/81 132/90 (!) 142/98  Pulse: 100 99 85 90  Resp: 14 16    Temp: 98 F (36.7 C) 97.6 F (36.4 C) 97.6 F (36.4 C) 98.7 F (37.1 C)  TempSrc: Oral Oral Oral Oral  SpO2: 98% 100% 95% 100%  Weight:  Height:        Intake/Output Summary (Last 24 hours) at 03/05/2018 1447 Last data filed at 03/05/2018 1000 Gross per 24 hour  Intake 120 ml  Output 400 ml  Net -280 ml   Filed Weights   02/28/18 2103  Weight: 56.9 kg    Examination:  General exam: NAD Respiratory system: CTA Cardiovascular system: S 1, S 2RRR Gastrointestinal system; BS present, soft, nt Central nervous system: Alert.  Extremities: S/P  Left LE below knee amputation    Data Reviewed: I have personally reviewed following labs and imaging studies  CBC: Recent Labs  Lab 02/28/18 2319 03/01/18 0800 03/02/18 0358 03/03/18 0714 03/04/18 0332  WBC 16.0* 13.3* 12.9* 12.2* 10.9*  NEUTROABS 11.6*  --   --   --   --   HGB 9.9* 8.1* 8.6* 7.0* 9.3*  HCT 30.7* 25.2* 27.0* 21.4* 27.8*  MCV 82.7 80.8 81.1 82.0 81.3  PLT 652* 562* 589* 490* 570*   Basic Metabolic Panel: Recent Labs  Lab 02/28/18 2319 03/01/18 0800 03/02/18 0358 03/03/18 0714 03/04/18 0332  NA 134* 137 136 137 138  K 3.4* 3.1* 3.1* 4.0 3.6  CL 101 109 105 109 108  CO2 22 23 23 22 22   GLUCOSE 120* 106* 98 118* 107*  BUN 11 5* 5* 6* <5*  CREATININE 0.76 0.65 0.71 0.76 0.79  CALCIUM 8.6* 8.0* 8.1* 8.0* 8.3*  MG  --   --   --  1.8  --    GFR: Estimated Creatinine Clearance: 73.1 mL/min (by C-G formula based on SCr of 0.79 mg/dL). Liver Function Tests: No results for input(s): AST, ALT, ALKPHOS, BILITOT, PROT, ALBUMIN in the last 168 hours. No results for input(s): LIPASE, AMYLASE in the last 168 hours. No results for input(s): AMMONIA in the last 168 hours. Coagulation Profile: No results for input(s): INR, PROTIME in the last 168 hours. Cardiac Enzymes: Recent Labs  Lab 02/28/18 2319  CKTOTAL 59   BNP (last 3 results) No results for input(s): PROBNP in the last 8760 hours. HbA1C: No results for input(s): HGBA1C in the last 72 hours. CBG: No results for input(s): GLUCAP in the last 168 hours. Lipid Profile: No results for input(s): CHOL, HDL, LDLCALC, TRIG, CHOLHDL, LDLDIRECT in the last 72 hours. Thyroid Function Tests: No results for input(s): TSH, T4TOTAL, FREET4, T3FREE, THYROIDAB in the last 72 hours. Anemia Panel: No results for input(s): VITAMINB12, FOLATE, FERRITIN, TIBC, IRON, RETICCTPCT in the last 72 hours. Sepsis Labs: Recent Labs  Lab 02/28/18 2331  LATICACIDVEN 1.86    Recent Results (from the past 240 hour(s))  Blood  culture (routine x 2)     Status: None (Preliminary result)   Collection Time: 02/28/18 11:22 PM  Result Value Ref Range Status   Specimen Description   Final    BLOOD RIGHT ANTECUBITAL Performed at [facility], [address] [place], [place] [address]    Special Requests   Final    BOTTLES DRAWN AEROBIC AND ANAEROBIC Blood Culture adequate volume Performed at [facility], [address], [place], [place] [address]    Culture   Final    NO GROWTH 4 DAYS Performed at [facility], [address] [place], [place] [address]    Report Status PENDING  Incomplete  Blood culture (routine x 2)     Status: None (Preliminary result)   Collection Time: 02/28/18 11:40 PM  Result Value Ref Range Status   Specimen  Description   Final    BLOOD LEFT ANTECUBITAL Performed at [facility], [address] [place], [place] [address]    Special Requests   Final    BOTTLES DRAWN AEROBIC AND ANAEROBIC Blood Culture adequate volume Performed at [facility], [address] [place], [place] [address]    Culture   Final    NO GROWTH 4 DAYS Performed at [facility], [address], [place], [place] [address]    Report Status PENDING  Incomplete  Surgical pcr screen     Status: Abnormal   Collection Time: 03/02/18  3:39 AM  Result Value Ref Range Status   MRSA, PCR POSITIVE (A) NEGATIVE Final    Comment: RESULT CALLED TO, READ BACK BY AND VERIFIED WITH: RN R ARCILLA 03/02/18 AT 804 AM BY CM    Staphylococcus aureus POSITIVE (A) NEGATIVE Final    Comment: RESULT CALLED TO, READ BACK BY AND VERIFIED WITH: RN R ARCILLA 03/02/18 AT 804 AM BY CM (NOTE) The Xpert SA Assay (FDA approved for NASAL specimens in patients 23 years of age and older), is one component of a comprehensive surveillance program. It is not intended to diagnose infection nor to guide or monitor treatment. Performed at [facility], [address],  [place], [place] [address]          Radiology Studies: No results found.      Scheduled Meds: . Chlorhexidine Gluconate Cloth  6 each Topical Q0600  . clopidogrel  75 mg Oral Daily  . ferrous sulfate  325 mg Oral BID WC  . metoprolol succinate  25 mg Oral Daily  . mupirocin ointment  1 application Nasal BID  . polyethylene glycol  17 g Oral BID  . vitamin B-12  1,000 mcg Oral Daily   Continuous Infusions: . lactated ringers       LOS: 4 days    Time spent: 35 minutes.     Alba Cory, MD [facility] Pager [phone]  If 7PM-7AM, please contact night-coverage [email or web address] Password [facility] 03/05/2018, 2:47 PM

## 2018-03-05 NOTE — Plan of Care (Signed)
  Problem: Clinical Measurements: Goal: Will remain free from infection Outcome: Progressing   Problem: Clinical Measurements: Goal: Diagnostic test results will improve Outcome: Progressing   Problem: Clinical Measurements: Goal: Cardiovascular complication will be avoided Outcome: Progressing   Problem: Elimination: Goal: Will not experience complications related to bowel motility Outcome: Progressing   Problem: Elimination: Goal: Will not experience complications related to urinary retention Outcome: Progressing   Problem: Safety: Goal: Ability to remain free from injury will improve Outcome: Progressing

## 2018-03-06 ENCOUNTER — Telehealth: Payer: Self-pay | Admitting: Vascular Surgery

## 2018-03-06 LAB — CULTURE, BLOOD (ROUTINE X 2)
CULTURE: NO GROWTH
CULTURE: NO GROWTH
SPECIAL REQUESTS: ADEQUATE
Special Requests: ADEQUATE

## 2018-03-06 MED ORDER — AMLODIPINE BESYLATE 5 MG PO TABS
5.0000 mg | ORAL_TABLET | Freq: Every day | ORAL | Status: DC
  Administered 2018-03-06 – 2018-03-07 (×2): 5 mg via ORAL
  Filled 2018-03-06 (×2): qty 1

## 2018-03-06 NOTE — Progress Notes (Signed)
PROGRESS NOTE    Malik Stanton  WUJ:[id number] DOB: 1950-06-24 DOA: 02/28/2018 PCP: [facility]    Brief Narrative: Malik Stanton is a 67 y.o. male with medical history significant for dementia, hypertension, and peripheral arterial disease status post stent to the left external iliac artery and left SFA earlier this month, now presenting to the emergency department for evaluation of worsening ulcerations involving the distal left leg.  Patient's family reportedly became concerned with acute worsening and distal left leg wounds and sent patient to the ED.  He had been admitted earlier this month with critical limb ischemia and underwent successful stenting on 02/20/2018.  Family is not available at this time, but had reported to ED personnel that the wound had worsened acutely.  Patient denies pain at this time but has already been treated with fentanyl prior to exam.  Patient has no complaints.    [facility] ED Course: Upon arrival to the ED, patient is found to be afebrile, saturating well on room air, and with vitals otherwise stable.  Chemistry panel is notable for slight hyponatremia and hypokalemia.  CBC features and increased leukocytosis to 16,000, increasing thrombocytosis now 652,000, and a stable normocytic anemia with hemoglobin 9.9.  Lactic acid is reassuring at 1.86.  Radiographs are negative for gas or osteomyelitis.  Blood cultures were collected and patient was given a liter of normal saline, fentanyl, vancomycin, and Zosyn.  Vascular surgery was consulted by the ED physician and transfer to [facility] for admission was arranged.  Patient admitted with critical limb ischemia, his chronic wound fail to improved after stent to iliac artery and left SFA. He was started on IV heparin, IV antibiotics. He was evaluated by vascular who recommend below knee amputation. Patient underwent surgery 10-25. Patient has remain stable post surgery. Antibiotics  were discontinue post surgery. 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 In the ED no pulse detected with doppler.  Patient was started on IV antibiotics.  Pain management.  Discussed with Dr Randie Heinz, patient needs amputation. He will speak with family. Also patient doesn't need IV heparin.  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   stop antibiotics.  Stable.   2-Gangrene lower extremity;  Large LLE ulceration, necrosis, progress in size per family.  Started on IV antibiotics.  Vascular consulted.  S/P below knee amputation 10-25  Stop IV antibiotics.    3-Hypokalemia;  Resolved.   Anemia; Acute blood loss.  Iron deficiency anemia;  Started  ferrous sulfate.  Transfuse one unit.  Hb improved. Stable.   HTN; continue with Toprol. Resume low dose norvasc.   Leukocytosis; in setting of infection.  Treated with IV antibiotics.  Follow blood culture,. No growth to date,  Follow trend.   DVT prophylaxis: heparin  Code Status: full code.  Family Communication: no family at bedside.  Disposition Plan: awaiting insurance approval for SNF Consultants:   Dr Randie Heinz.    Procedures:   none   Antimicrobials:  Vancomycin   Zosyn,    Subjective: No new complaints. Denies abdominal pain/   Objective: Vitals:  03/05/18 1317 03/05/18 2230 03/06/18 0615 03/06/18 1054  BP: (!) 142/98 (!) 142/74 (!) 151/70 (!) 152/104  Pulse: 90 100 88 82  Resp:  18    Temp: 98.7 F (37.1 C)   98.5 F (36.9 C)  TempSrc: Oral   Oral  SpO2: 100% 100% 100% 100%  Weight:      Height:        Intake/Output  Summary (Last 24 hours) at 03/06/2018 1249 Last data filed at 03/06/2018 1106 Gross per 24 hour  Intake 720 ml  Output 730 ml  Net -10 ml   Filed Weights   02/28/18 2103  Weight: 56.9 kg    Examination:  General exam: NAD Respiratory system: CTA Cardiovascular system: S 1, S 2 RRR Gastrointestinal system; BS present, soft, nt Central nervous system: Alert.  Extremities: S/ P left below knee amputation, surgical site without redness.    Data Reviewed: I have personally reviewed following labs and imaging studies  CBC: Recent Labs  Lab 02/28/18 2319 03/01/18 0800 03/02/18 0358 03/03/18 0714 03/04/18 0332 03/05/18 1500  WBC 16.0* 13.3* 12.9* 12.2* 10.9* 12.1*  NEUTROABS 11.6*  --   --   --   --   --   HGB 9.9* 8.1* 8.6* 7.0* 9.3* 9.6*  HCT 30.7* 25.2* 27.0* 21.4* 27.8* 29.6*  MCV 82.7 80.8 81.1 82.0 81.3 83.6  PLT 652* 562* 589* 490* 570* 542*   Basic Metabolic Panel: Recent Labs  Lab 02/28/18 2319 03/01/18 0800 03/02/18 0358 03/03/18 0714 03/04/18 0332  NA 134* 137 136 137 138  K 3.4* 3.1* 3.1* 4.0 3.6  CL 101 109 105 109 108  CO2 22 23 23 22 22   GLUCOSE 120* 106* 98 118* 107*  BUN 11 5* 5* 6* <5*  CREATININE 0.76 0.65 0.71 0.76 0.79  CALCIUM 8.6* 8.0* 8.1* 8.0* 8.3*  MG  --   --   --  1.8  --    GFR: Estimated Creatinine Clearance: 73.1 mL/min (by C-G formula based on SCr of 0.79 mg/dL). Liver Function Tests: No results for input(s): AST, ALT, ALKPHOS, BILITOT, PROT, ALBUMIN in the last 168 hours. No results for input(s): LIPASE, AMYLASE in the last 168 hours. No results for input(s): AMMONIA in the last 168 hours. Coagulation Profile: No results for input(s): INR, PROTIME in the last 168 hours. Cardiac Enzymes: Recent Labs  Lab 02/28/18 2319  CKTOTAL 59   BNP (last 3 results) No results for input(s): PROBNP in the last 8760 hours. HbA1C: No results for input(s): HGBA1C in the last 72 hours. CBG: No results for input(s): GLUCAP in the last  168 hours. Lipid Profile: No results for input(s): CHOL, HDL, LDLCALC, TRIG, CHOLHDL, LDLDIRECT in the last 72 hours. Thyroid Function Tests: No results for input(s): TSH, T4TOTAL, FREET4, T3FREE, THYROIDAB in the last 72 hours. Anemia Panel: No results for input(s): VITAMINB12, FOLATE, FERRITIN, TIBC, IRON, RETICCTPCT in the last 72 hours. Sepsis Labs: Recent Labs  Lab 02/28/18 2331  LATICACIDVEN 1.86    Recent Results (from the past 240 hour(s))  Blood culture (routine x 2)     Status: None   Collection Time: 02/28/18 11:22 PM  Result Value Ref Range Status   Specimen Description   Final    BLOOD RIGHT ANTECUBITAL Performed at [facility], [address] [place], [place] [address]    Special Requests   Final    BOTTLES DRAWN AEROBIC AND ANAEROBIC Blood Culture adequate volume Performed at [facility]  [address], [address] [place], [place] [address]    Culture   Final    NO GROWTH 5 DAYS Performed at [facility], [address], [place], [place] [address]    Report Status 03/06/2018 FINAL  Final  Blood culture (routine x 2)     Status: None   Collection Time: 02/28/18 11:40 PM  Result Value Ref Range Status   Specimen Description   Final    BLOOD LEFT ANTECUBITAL Performed at [facility], [address] [place], [place] [address]    Special Requests   Final    BOTTLES DRAWN AEROBIC AND ANAEROBIC Blood Culture adequate volume Performed at [facility], [address] [place], [place] [address]    Culture   Final    NO GROWTH 5 DAYS Performed at [facility], [address] [place], [place] [address]    Report Status 03/06/2018 FINAL  Final  Surgical pcr screen     Status: Abnormal   Collection Time: 03/02/18  3:39 AM  Result Value Ref Range Status   MRSA, PCR POSITIVE (A) NEGATIVE Final    Comment: RESULT CALLED TO, READ BACK BY AND VERIFIED WITH: RN R ARCILLA 03/02/18 AT 804 AM BY CM    Staphylococcus  aureus POSITIVE (A) NEGATIVE Final    Comment: RESULT CALLED TO, READ BACK BY AND VERIFIED WITH: RN R ARCILLA 03/02/18 AT 804 AM BY CM (NOTE) The Xpert SA Assay (FDA approved for NASAL specimens in patients 44 years of age and older), is one component of a comprehensive surveillance program. It is not intended to diagnose infection nor to guide or monitor treatment. Performed at [facility], [address] [place], [place] [address]          Radiology Studies: No results found.      Scheduled Meds: . amLODipine  5 mg Oral Daily  . Chlorhexidine Gluconate Cloth  6 each Topical Q0600  . clopidogrel  75 mg Oral Daily  . ferrous sulfate  325 mg Oral BID WC  . metoprolol succinate  25 mg Oral Daily  . mupirocin ointment  1 application Nasal BID  . polyethylene glycol  17 g Oral BID  . vitamin B-12  1,000 mcg Oral Daily   Continuous Infusions: . lactated ringers       LOS: 5 days    Time spent: 35 minutes.     Alba Cory, MD [facility] Pager [phone]  If 7PM-7AM, please contact night-coverage [email or web address] Password [email or web address] 03/06/2018, 12:49 PM

## 2018-03-06 NOTE — Progress Notes (Signed)
Physical Therapy Treatment Patient Details Name: Malik Stanton MRN: [id number] DOB: 12-15-1950 Today's Date: 03/06/2018    History of Present Illness Malik Stanton a 67 y.o.malewith medical history significant fordementia, hypertension, and peripheral arterial disease status post stent to the left external iliac artery and left SFA earlier this month (02/20/18), now presenting to the emergency department for evaluation of worsening ulcerations involving the distal left leg. now s/p L BKA.    PT Comments    Pt apprehensive about working with therapy today, with encouragement he was relented. With sitting up EoB pt was found to be soiled and was likely embarrassed to get up. Pt is currently supervision for bed mobility, minAx2 for transfers, standing for pericare and hopping 2 feet to recliner. D/c plans remain appropriate at this time.     Follow Up Recommendations  SNF     Equipment Recommendations  None recommended by PT       Precautions / Restrictions Precautions Precautions: Fall Restrictions Weight Bearing Restrictions: Yes LLE Weight Bearing: Non weight bearing    Mobility  Bed Mobility Overal bed mobility: Needs Assistance Bed Mobility: Supine to Sit     Supine to sit: Supervision;HOB elevated     General bed mobility comments: pt moved quickly to seated EoB with supervision for safety, able to manage R LE into bed without assist,   Transfers Overall transfer level: Needs assistance Equipment used: Rolling walker (2 wheeled) Transfers: Sit to/from Stand Sit to Stand: Min assist;+2 physical assistance         General transfer comment: minAx2 for powerup and steadying with RW, better steadying today  Ambulation/Gait Ambulation/Gait assistance: Min assist;+2 physical assistance Gait Distance (Feet): 2 Feet Assistive device: Rolling walker (2 wheeled) Gait Pattern/deviations: Step-to pattern;Trunk flexed Gait velocity: Decreased Gait velocity  interpretation: <1.31 ft/sec, indicative of household ambulator General Gait Details: minAx2 for steadying with hopping to R and hopping backward towards the recliner       Balance Overall balance assessment: Needs assistance Sitting-balance support: Feet supported Sitting balance-Leahy Scale: Fair     Standing balance support: Bilateral upper extremity supported;Single extremity supported;During functional activity Standing balance-Leahy Scale: Poor Standing balance comment: requires RW and therapist assist                High Level Balance Comments: pt able to stand with min A for 2 minutes for pericare,began to fatigue at end             Cognition Arousal/Alertness: Awake/alert Behavior During Therapy: Flat affect Overall Cognitive Status: No family/caregiver present to determine baseline cognitive functioning                                 General Comments: Slowed responses, follows majority of simple/one-step commands         General Comments General comments (skin integrity, edema, etc.): Pt initially did not want to work with therapy, and then was embarassed when found to be soiled        Pertinent Vitals/Pain Pain Assessment: No/denies pain           PT Goals (current goals can now be found in the care plan section) Acute Rehab PT Goals Patient Stated Goal: none stated PT Goal Formulation: With patient Time For Goal Achievement: 03/17/18 Potential to Achieve Goals: Good Progress towards PT goals: Progressing toward goals    Frequency    Min 3X/week      PT  Plan Current plan remains appropriate    Co-evaluation PT/OT/SLP Co-Evaluation/Treatment: Yes            AM-PAC PT "6 Clicks" Daily Activity  Outcome Measure  Difficulty turning over in bed (including adjusting bedclothes, sheets and blankets)?: None Difficulty moving from lying on back to sitting on the side of the bed? : A Little Difficulty sitting down on and  standing up from a chair with arms (e.g., wheelchair, bedside commode, etc,.)?: Unable Help needed moving to and from a bed to chair (including a wheelchair)?: A Lot Help needed walking in hospital room?: A Lot Help needed climbing 3-5 steps with a railing? : Total 6 Click Score: 13    End of Session Equipment Utilized During Treatment: Gait belt Activity Tolerance: Patient tolerated treatment well Patient left: in bed;with call bell/phone within reach;with bed alarm set Nurse Communication: Mobility status PT Visit Diagnosis: Unsteadiness on feet (R26.81);Other abnormalities of gait and mobility (R26.89);Muscle weakness (generalized) (M62.81);Difficulty in walking, not elsewhere classified (R26.2)     Time: 1500-1530 PT Time Calculation (min) (ACUTE ONLY): 30 min  Charges:  $Therapeutic Activity: 23-37 mins                     Malik Klas B. Beverely Risen PT, DPT Acute Rehabilitation Services Pager [phone] Office [phone]    Malik Stanton 03/06/2018, 4:56 PM

## 2018-03-06 NOTE — Telephone Encounter (Signed)
-----   Message from Emilie Rutter, PA-C sent at 03/04/2018  9:57 AM EDT -----  Can you schedule an appt for this pt in 4-5 weeks for PA clinic possible staple removal.  PO L BKA. Thanks, [employer]

## 2018-03-06 NOTE — Telephone Encounter (Signed)
sch appt spk to pt daughter 04/13/18 1pm p/o PA

## 2018-03-07 MED ORDER — POLYETHYLENE GLYCOL 3350 17 G PO PACK: 17.0000 g | PACK | Freq: Every day | ORAL | Status: AC

## 2018-03-07 MED ORDER — HYDROCODONE-ACETAMINOPHEN 5-325 MG PO TABS: 1.0000 | ORAL_TABLET | Freq: Three times a day (TID) | ORAL | 0 refills | Status: DC | PRN

## 2018-03-07 MED ORDER — ACETAMINOPHEN 325 MG PO TABS: 650.0000 mg | ORAL_TABLET | Freq: Four times a day (QID) | ORAL | Status: AC | PRN

## 2018-03-07 MED ORDER — FERROUS SULFATE 325 (65 FE) MG PO TABS: 325.0000 mg | ORAL_TABLET | Freq: Two times a day (BID) | ORAL | Status: AC

## 2018-03-07 MED ORDER — CYANOCOBALAMIN 1000 MCG PO TABS: 1000.0000 ug | ORAL_TABLET | Freq: Every day | ORAL | Status: AC

## 2018-03-07 NOTE — Progress Notes (Signed)
Pt alert, able to make needs known. Vital signs stable. No acute distress noted. Denies pain at this time. Skin re-assessed for discharge, No skin areas identified other than the incision from L BKA. Pt does have dry flaky skin. Pt belongings returned to him, and he left facility with PTAR. Report called to [place] at [place].

## 2018-03-07 NOTE — Clinical Social Work Placement (Signed)
   CLINICAL SOCIAL WORK PLACEMENT  NOTE  Date:  03/07/2018  Patient Details  Name: Malik Stanton MRN: [id number] Date of Birth: [birth year]  Clinical Social Work is seeking post-discharge placement for this patient at the Skilled  Nursing Facility level of care (*CSW will initial, date and re-position this form in  chart as items are completed):  Yes   Patient/family provided with [facility] Clinical Social Work Department's list of facilities offering this level of care within the geographic area requested by the patient (or if unable, by the patient's family).  Yes   Patient/family informed of their freedom to choose among providers that offer the needed level of care, that participate in Medicare, Medicaid or managed care program needed by the patient, have an available bed and are willing to accept the patient.      Patient/family informed of [facility]'s ownership interest in [facility] and [facility], as well as of the fact that they are under no obligation to receive care at these facilities.  PASRR submitted to EDS on       PASRR number received on 03/05/18     Existing PASRR number confirmed on       FL2 transmitted to all facilities in geographic area requested by pt/family on 03/05/18     FL2 transmitted to all facilities within larger geographic area on       Patient informed that his/her managed care company has contracts with or will negotiate with certain facilities, including the following:        Yes   Patient/family informed of bed offers received.  Patient chooses bed at [facility]     Physician recommends and patient chooses bed at      Patient to be transferred to [facility] on 03/07/18.  Patient to be transferred to facility by PTAR     Patient family notified on 03/07/18 of transfer.  Name of family member notified:  Grandaughter [place]     PHYSICIAN       Additional Comment:     _______________________________________________ Gildardo Griffes, LCSW 03/07/2018, 1:03 PM

## 2018-03-07 NOTE — Progress Notes (Signed)
   03/07/18 1332  Incision (Closed) 03/02/18 Leg Left  Date First Assessed/Time First Assessed: 03/02/18 1452   Location: Leg  Location Orientation: Left  Dressing Type None  Site / Wound Assessment Clean;Dry  Margins Attached edges (approximated)  Closure Staples  Drainage Amount None  Treatment Cleansed  Site to left lower extremity from BKA. Area noted to be warm, dry. No redness, tenderness noted. Well approximated.

## 2018-03-07 NOTE — Plan of Care (Signed)
  Problem: Education: Goal: Knowledge of General Education information will improve Description Including pain rating scale, medication(s)/side effects and non-pharmacologic comfort measures Outcome: Progressing   Problem: Health Behavior/Discharge Planning: Goal: Ability to manage health-related needs will improve Outcome: Progressing   Problem: Clinical Measurements: Goal: Ability to maintain clinical measurements within normal limits will improve Outcome: Progressing Goal: Will remain free from infection Outcome: Progressing Goal: Diagnostic test results will improve Outcome: Progressing Goal: Respiratory complications will improve Outcome: Progressing Goal: Cardiovascular complication will be avoided Outcome: Progressing   Problem: Activity: Goal: Risk for activity intolerance will decrease Outcome: Progressing   Problem: Nutrition: Goal: Adequate nutrition will be maintained Outcome: Progressing   Problem: Elimination: Goal: Will not experience complications related to bowel motility Outcome: Progressing Goal: Will not experience complications related to urinary retention Outcome: Progressing   Problem: Pain Managment: Goal: General experience of comfort will improve Outcome: Progressing   Problem: Safety: Goal: Ability to remain free from injury will improve Outcome: Progressing   Problem: Skin Integrity: Goal: Risk for impaired skin integrity will decrease Outcome: Progressing   Problem: Consults Goal: Venous Thromboembolism Patient Education Description See Patient Education Module for education specifics. Outcome: Progressing Goal: Diagnosis - Venous Thromboembolism (VTE) Description Choose a selection Outcome: Progressing Goal: Skin Care Protocol Initiated - if Braden Score 18 or less Description If consults are not indicated, leave blank or document N/A Outcome: Progressing   Problem: Phase I Progression Outcomes Goal: Pain controlled with  appropriate interventions Outcome: Progressing Goal: Tolerating diet Outcome: Progressing   Problem: Phase II Progression Outcomes Goal: Tolerating diet Outcome: Progressing   Problem: Discharge Progression Outcomes Goal: Barriers To Progression Addressed/Resolved Outcome: Progressing Goal: Discharge plan in place and appropriate Outcome: Progressing Goal: Pain controlled with appropriate interventions Outcome: Progressing Goal: Hemodynamically stable Outcome: Progressing Goal: Complications resolved/controlled Outcome: Progressing Goal: Activity appropriate for discharge plan Outcome: Progressing Goal: Other Discharge Outcomes/Goals Outcome: Progressing   Problem: Cardiovascular: Goal: Ability to achieve and maintain adequate cardiovascular perfusion will improve Outcome: Progressing   Problem: Self-Care: Goal: Ability to meet self-care needs will improve Outcome: Progressing   Problem: Pain Management: Goal: Pain level will decrease with appropriate interventions Outcome: Progressing

## 2018-03-07 NOTE — Discharge Summary (Addendum)
Physician Discharge Summary  Malik Stanton ZOX:[id number] DOB: Nov 11, 1950  PCP: [facility]  Admit date: 02/28/2018 Discharge date: 03/07/2018  Recommendations for Outpatient Follow-up:  1. MD at SNF in 3 days with repeat labs (CBC & BMP). 2. Dr. Lemar Livings, [facility] in 3 weeks for postop follow-up including wound check and staples removal.  SNF to coordinate. 3. PCP at [facility], upon discharge from SNF. 4. Nonweightbearing on left lower extremity until outpatient follow-up with [facility].   Home Health: Patient being discharged to [facility] in [place], SNF. Equipment/Devices: To be determined at SNF.  Discharge Condition: Improved and stable. CODE STATUS: Full. Diet recommendation: Heart healthy diet.  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 [facility])   Brief Summary: 67 year old male, lived at home, PMH of dementia, HTN, PAD, recent admission at [facility] 10/5-10/10 for critical limb ischemia at which time angiogram was planned but not performed due to fever, Eliquis was started and patient was discharged but due to failure of wounds to improve, family brought him back to the hospital and he was readmitted at [facility] 02/17/2018- 02/22/2018 at which time ABIs showed critical left limb ischemia, IV heparin was started, vascular surgery was consulted and he underwent stent from his left external iliac artery down through his SFA distally on 02/20/2018, there was no strong indication for anticoagulation which was discontinued and patient was discharged home, now presented to the ED on 03/01/2018 due to worsening left lower extremity wounds.  He was again admitted for critical left lower extremity ischemia  complicated by worsening lower extremity wounds that were failing to heal.  Vascular surgery was again consulted.  Assessment and plan:  1. 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  In the ED, no pulse detected with Doppler.  He was empirically started on IV antibiotics and IV heparin infusion.  Vascular surgery was consulted and after discussing with family, he underwent left BKA on 10/25.  Since no further indication, antibiotics and heparin have since been discontinued.  Continue Plavix.  Clinically improved and stable.  As per vascular surgery sign off note 10/27, outpatient follow-up with them in 4 weeks. 2. Hypokalemia: Replaced.  Magnesium normal.  Follow BMP periodically as outpatient. 3. Postop acute blood loss anemia complicating anemia of chronic disease/possible iron and B12 deficiency: Anemia panel: Iron 14, TIBC 123, saturation ratio 11, ferritin 156, folate 8.2 and B12: 264.  Patient was transfused 1 unit PRBC with appropriate improvement in hemoglobin.  Hemoglobin stable.  Also started on iron supplements and B12 supplements for suspected iron deficiency and B12 deficiency.  Follow CBCs periodically as outpatient. 4. Essential hypertension: Mildly uncontrolled.  Discontinue prior home amlodipine since he was on amlodipine-valsartan-HCTZ which can continue along with Toprol-XL.  Monitor and adjust medications as needed. 5. Leukocytosis: In the setting of acute illness,?  Infection.  Briefly treated with IV antibiotics.  Blood cultures x2 Neg 6. Dementia: Mental status likely at baseline.  No agitation. 7. Severe protein calorie malnutrition/Body mass index is 18 kg/m.:  Consider dietitian consultation at SNF.    Consultations:  Vascular surgery  Inpatient rehab- PT and OT recommended SNF and not felt to be CIR appropriate.  Procedures:  As above.   Discharge Instructions  Discharge Instructions    Call MD  for:  difficulty breathing, headache or visual disturbances   Complete by:  As directed    Call MD for:  extreme fatigue   Complete by:  As directed    Call MD for:  persistant dizziness or light-headedness   Complete by:  As directed    Call MD for:  redness, tenderness, or signs of infection (pain, swelling, redness, odor or green/yellow discharge around incision site)   Complete by:  As directed    Call MD for:  severe uncontrolled pain   Complete by:  As directed    Call MD for:  temperature >100.4   Complete by:  As directed    Diet - low sodium heart healthy   Complete by:  As directed    Discharge instructions   Complete by:  As directed    Nonweightbearing on left lower extremity.   Increase activity slowly   Complete by:  As directed        Medication List    STOP taking these medications   amLODipine 10 MG tablet Commonly known as:  NORVASC     TAKE these medications   acetaminophen 325 MG tablet Commonly known as:  TYLENOL Take 2 tablets (650 mg total) by mouth every 6 (six) hours as needed for mild pain (or Fever >/= 101).   amLODIPine-Valsartan-HCTZ 10-320-25 MG Tabs Take 1 tablet by mouth daily.   clopidogrel 75 MG tablet Commonly known as:  PLAVIX Take 1 tablet (75 mg total) by mouth daily.   cyanocobalamin 1000 MCG tablet Take 1 tablet (1,000 mcg total) by mouth daily. Start taking on:  03/08/2018   ferrous sulfate 325 (65 FE) MG tablet Take 1 tablet (325 mg total) by mouth 2 (two) times daily with a meal.   HYDROcodone-acetaminophen 5-325 MG tablet Commonly known as:  NORCO/VICODIN Take 1 tablet by mouth every 8 (eight) hours as needed for moderate pain or severe pain.   metoprolol succinate 25 MG 24 hr tablet Commonly known as:  TOPROL-XL Take 25 mg by mouth daily.   polyethylene glycol packet Commonly known as:  MIRALAX / GLYCOLAX Take 17 g by mouth daily.       Contact information for follow-up providers    Maeola Harman,  MD. Schedule an appointment as soon as possible for a visit in 3 week(s).   Specialties:  Vascular Surgery, Cardiology Why:  For postop follow-up, wound check and staple removal. Contact information: [address] [place] [address] [phone]        [facility] [place]. Schedule an appointment as soon as possible for a visit.   Specialty:  Internal Medicine Why:  Upon discharge from SNF. Contact information: [address] [place] [address] [phone]        MD at SNF. Schedule an appointment as soon as possible for a visit in 3 day(s).   Why:  To be seen with repeat labs (CBC & BMP).           Contact information for after-discharge care    Destination    [facility] .   Service:  Skilled Nursing Contact information: [address] [place] [address] [phone]                 No Known Allergies    Procedures/Studies:  Dg Tibia/fibula Left  Result Date: 03/01/2018 CLINICAL DATA:  Necrosis, known ischemia EXAM: LEFT TIBIA AND FIBULA - 2 VIEW COMPARISON:  None. FINDINGS: Degenerative changes in the left knee with joint space narrowing and spurring. No acute bony abnormality. Specifically, no fracture, subluxation, or dislocation. No radiographic changes of osteomyelitis. Soft tissues appear intact. IMPRESSION: No acute bony abnormality. No radiographic changes of osteomyelitis. Electronically Signed   By: Charlett Nose M.D.   On: 03/01/2018 00:19   Dg Foot Complete Left  Result Date: 03/01/2018 CLINICAL DATA:  Necrosis, lower extremity ischemia EXAM: LEFT FOOT - COMPLETE 3+ VIEW COMPARISON:  None. FINDINGS: No acute bony abnormality. Specifically, no fracture, subluxation, or dislocation. No radiographic changes of osteomyelitis. Soft tissues are intact. IMPRESSION: No acute bony abnormality. Electronically Signed   By: Charlett Nose M.D.   On: 03/01/2018 00:20    Subjective: "I  am okay".  Denies complaints.  Does not report much pain in his operated left lower extremity.  Poor historian, likely related to dementia.  As per RN, no acute issues noted.  Tolerating diet.  Had some loose stools 2 days ago but none since.  Discharge Exam:  Vitals:   03/06/18 1954 03/07/18 0630 03/07/18 0816 03/07/18 0836  BP: 133/81 (!) 156/95 (!) 153/108 (!) 158/98  Pulse: 80 87 91   Resp: 14 16  18   Temp: 98.1 F (36.7 C) 98.3 F (36.8 C) (!) 97.5 F (36.4 C)   TempSrc: Oral Oral Oral   SpO2: 98% 100% 100%   Weight:      Height:        General: Pleasant middle-aged male, looks older than stated age, lying comfortably propped up in bed.  Oral mucosa moist. Cardiovascular: S1 & S2 heard, RRR, S1/S2 +. No murmurs, rubs, gallops or clicks. No JVD or pedal edema. Respiratory: Clear to auscultation without wheezing, rhonchi or crackles. No increased work of breathing. Abdominal:  Non distended, non tender & soft. No organomegaly or masses appreciated. Normal bowel sounds heard. CNS: Alert and oriented x2. No focal deficits. Extremities: no edema, no cyanosis.  Left BKA site clean and without acute findings.  Staples intact.    The results of significant diagnostics from this hospitalization (including imaging, microbiology, ancillary and laboratory) are listed below for reference.     Microbiology: Recent Results (from the past 240 hour(s))  Blood culture (routine x 2)     Status: None   Collection Time: 02/28/18 11:22 PM  Result Value Ref Range Status   Specimen Description   Final    BLOOD RIGHT ANTECUBITAL Performed at [facility], [address] [place], [place] [address]    Special Requests   Final    BOTTLES DRAWN AEROBIC AND ANAEROBIC Blood Culture adequate volume Performed at [facility], [address] [place], [place] [address]    Culture   Final    NO GROWTH 5 DAYS Performed at [facility], [address], [place], [place]  [address]    Report Status 03/06/2018 FINAL  Final  Blood culture (routine x 2)     Status: None   Collection Time: 02/28/18 11:40 PM  Result Value Ref Range Status   Specimen Description   Final    BLOOD LEFT ANTECUBITAL Performed at [facility], [address] [place], [place] [address]    Special Requests   Final    BOTTLES DRAWN AEROBIC AND ANAEROBIC Blood Culture adequate volume Performed at [facility], [address]  [place], [place] [address]    Culture   Final    NO GROWTH 5 DAYS Performed at [facility], [address], [place], [place] [address]    Report Status 03/06/2018 FINAL  Final  Surgical pcr screen     Status: Abnormal   Collection Time: 03/02/18  3:39 AM  Result Value Ref Range Status   MRSA, PCR POSITIVE (A) NEGATIVE Final    Comment: RESULT CALLED TO, READ BACK BY AND VERIFIED WITH: RN R ARCILLA 03/02/18 AT 804 AM BY CM    Staphylococcus aureus POSITIVE (A) NEGATIVE Final    Comment: RESULT CALLED TO, READ BACK BY AND VERIFIED WITH: RN R ARCILLA 03/02/18 AT 804 AM BY CM (NOTE) The Xpert SA Assay (FDA approved for NASAL specimens in patients 55 years of age and older), is one component of a comprehensive surveillance program. It is not intended to diagnose infection nor to guide or monitor treatment. Performed at [facility], [address], [place], [place] [address]      Labs: CBC: Recent Labs  Lab 02/28/18 2319 03/01/18 0800 03/02/18 0358 03/03/18 0714 03/04/18 0332 03/05/18 1500  WBC 16.0* 13.3* 12.9* 12.2* 10.9* 12.1*  NEUTROABS 11.6*  --   --   --   --   --   HGB 9.9* 8.1* 8.6* 7.0* 9.3* 9.6*  HCT 30.7* 25.2* 27.0* 21.4* 27.8* 29.6*  MCV 82.7 80.8 81.1 82.0 81.3 83.6  PLT 652* 562* 589* 490* 570* 542*   Basic Metabolic Panel: Recent Labs  Lab 02/28/18 2319 03/01/18 0800 03/02/18 0358 03/03/18 0714 03/04/18 0332  NA 134* 137 136 137 138  K 3.4* 3.1* 3.1* 4.0 3.6  CL 101 109 105 109 108   CO2 22 23 23 22 22   GLUCOSE 120* 106* 98 118* 107*  BUN 11 5* 5* 6* <5*  CREATININE 0.76 0.65 0.71 0.76 0.79  CALCIUM 8.6* 8.0* 8.1* 8.0* 8.3*  MG  --   --   --  1.8  --    Cardiac Enzymes: Recent Labs  Lab 02/28/18 2319  CKTOTAL 59   I discussed with patient's son, updated care and answered questions.   Time coordinating discharge: Over 30 minutes  SIGNED:  Marcellus Scott, MD, FACP, [facility]. [facility] Pager [phone]  If 7PM-7AM, please contact night-coverage [email or web address] Password [email or web address] 03/07/2018, 12:27 PM

## 2018-03-07 NOTE — Progress Notes (Signed)
Patient will DC to: [facility] Anticipated DC date: 03/07/18 Family notified: [facility] [facility] by: Sharin Mons  Per MD patient ready for DC to [facility] . RN, patient, patient's family, and facility notified of DC. Discharge Summary sent to facility. RN given number for report [phone] Room 112. DC packet on chart. Ambulance transport requested for patient.  CSW signing off.  [place], [place] [phone]

## 2018-03-07 NOTE — Discharge Instructions (Signed)

## 2018-03-07 NOTE — Progress Notes (Signed)
MD notified of elevated BP.

## 2018-03-07 NOTE — Progress Notes (Signed)
Occupational Therapy Treatment Patient Details Name: Malik Stanton MRN: [id number] DOB: [birth year] Today's Date: 03/07/2018    History of present illness Malik Stanton a 67 y.o.malewith medical history significant fordementia, hypertension, and peripheral arterial disease status post stent to the left external iliac artery and left SFA earlier this month (02/20/18), now presenting to the emergency department for evaluation of worsening ulcerations involving the distal left leg. now s/p L BKA.   OT comments  Pt progressing towards OT goals this session. Pt performed bed mobility at supervision level and participated in seated dynamic exercises as preparatory method for LB dressing and other seated grooming tasks/ADL. Pt then was able to perform sit <>stand and small hops up for repositioning back to bed. Pt enjoyed listening to music this session, OT will continue to follow acutely- although Pt supposed to dc this afternoon to SNF.    Follow Up Recommendations  SNF;Supervision/Assistance - 24 hour    Equipment Recommendations  Other (comment)(defer to next venue of care)    Recommendations for Other Services      Precautions / Restrictions Precautions Precautions: Fall Restrictions Weight Bearing Restrictions: Yes LLE Weight Bearing: Non weight bearing       Mobility Bed Mobility Overal bed mobility: Needs Assistance Bed Mobility: Supine to Sit;Sit to Supine     Supine to sit: Supervision;HOB elevated Sit to supine: Supervision   General bed mobility comments: pt moved quickly to seated EoB with supervision for safety, able to manage R LE into bed without assist,   Transfers Overall transfer level: Needs assistance Equipment used: Rolling walker (2 wheeled) Transfers: Sit to/from Stand Sit to Stand: Min assist;From elevated surface;+2 safety/equipment         General transfer comment: minAx2 for powerup and steadying with RW, better steadying today    Balance  Overall balance assessment: Needs assistance Sitting-balance support: Feet supported Sitting balance-Leahy Scale: Fair     Standing balance support: Bilateral upper extremity supported;Single extremity supported;During functional activity Standing balance-Leahy Scale: Poor Standing balance comment: requires RW and therapist assist                High Level Balance Comments: pt able to stand with min A for 2 minutes for pericare,began to fatigue at end            ADL either performed or assessed with clinical judgement   ADL Overall ADL's : Needs assistance/impaired     Grooming: Wash/dry face;Oral care;Set up;Sitting Grooming Details (indicate cue type and reason): EOB today         Upper Body Dressing : Set up;Sitting Upper Body Dressing Details (indicate cue type and reason): to change gown                   General ADL Comments: worked on dynamic seated balance in preparation for LB ADL (like dressing) which will require lateral leans reaching outside center of gravity to touch targets and then right himself.      Vision       Perception     Praxis      Cognition Arousal/Alertness: Awake/alert Behavior During Therapy: Flat affect Overall Cognitive Status: No family/caregiver present to determine baseline cognitive functioning                                 General Comments: Slowed responses, follows majority of simple/one-step commands        Exercises Exercises: Other  exercises Other Exercises Other Exercises: dynamic seated exercises with lateral leans and touching "targets"   Shoulder Instructions       General Comments Enjoyed music playing during session    Pertinent Vitals/ Pain       Pain Assessment: No/denies pain  Home Living                                          Prior Functioning/Environment              Frequency  Min 2X/week        Progress Toward Goals  OT Goals(current  goals can now be found in the care plan section)  Progress towards OT goals: Progressing toward goals  Acute Rehab OT Goals Patient Stated Goal: "get to rehab" OT Goal Formulation: With patient Time For Goal Achievement: 03/17/18 Potential to Achieve Goals: Good  Plan Discharge plan remains appropriate;Frequency remains appropriate    Co-evaluation                 AM-PAC PT "6 Clicks" Daily Activity     Outcome Measure   Help from another person eating meals?: None Help from another person taking care of personal grooming?: None(in seated position) Help from another person toileting, which includes using toliet, bedpan, or urinal?: A Little Help from another person bathing (including washing, rinsing, drying)?: A Lot Help from another person to put on and taking off regular upper body clothing?: None Help from another person to put on and taking off regular lower body clothing?: A Lot 6 Click Score: 19    End of Session Equipment Utilized During Treatment: Gait belt;Rolling walker  OT Visit Diagnosis: Unsteadiness on feet (R26.81);Muscle weakness (generalized) (M62.81);Other symptoms and signs involving cognitive function;Adult, failure to thrive (R62.7)   Activity Tolerance Patient tolerated treatment well   Patient Left in bed;with call bell/phone within reach;with bed alarm set;with SCD's reapplied   Nurse Communication Mobility status;Precautions;Weight bearing status        Time: 4098-1191 OT Time Calculation (min): 19 min  Charges: OT General Charges $OT Visit: 1 Visit OT Treatments $Self Care/Home Management : 8-22 mins  Sherryl Manges OTR/L Acute Rehabilitation Services Pager: [phone] Office: [phone]   Malik Stanton 03/07/2018, 1:25 PM

## 2018-03-08 ENCOUNTER — Encounter: Payer: Self-pay | Admitting: Internal Medicine

## 2018-03-08 ENCOUNTER — Non-Acute Institutional Stay (SKILLED_NURSING_FACILITY): Payer: Medicare Other | Admitting: Internal Medicine

## 2018-03-08 DIAGNOSIS — I10 Essential (primary) hypertension: Secondary | ICD-10-CM | POA: Diagnosis not present

## 2018-03-08 DIAGNOSIS — D649 Anemia, unspecified: Secondary | ICD-10-CM

## 2018-03-08 DIAGNOSIS — I739 Peripheral vascular disease, unspecified: Secondary | ICD-10-CM | POA: Diagnosis not present

## 2018-03-08 NOTE — Assessment & Plan Note (Signed)
BP controlled; no change in antihypertensive medications  

## 2018-03-08 NOTE — Assessment & Plan Note (Signed)
Vascular surgery follow-up in late November Wound care at [facility]

## 2018-03-08 NOTE — Progress Notes (Signed)
NURSING HOME LOCATION:  [facility] ROOM NUMBER:  111-A  CODE STATUS:  Full Code  PCP:  [facility]  [address] [place] [address]  This is a comprehensive admission note to [facility] performed on this date less than 30 days from date of admission. Included are preadmission medical/surgical history; reconciled medication list; family history; social history and comprehensive review of systems.  Corrections and additions to the records were documented. Comprehensive physical exam was also performed. Additionally a clinical summary was entered for each active diagnosis pertinent to this admission in the Problem List to enhance continuity of care.  HPI: The patient was hospitalized 10/23-10/30/2019 admitted for critical left lower extremity ischemia complicated by worsening left lower extremity wounds with failure of wound healing  in context of advanced peripheral vascular disease.[name] He had been hospitalized at [facility] 10/5-10/02/2018 for critical limb ischemia; angiogram was deferred at that time because of fever.  He was placed on Eliquis and discharged.  His family brought him back to the hospital and he was readmitted at [facility] 10/12-10/17.  ABIs showed critical left limb ischemia. IV heparin was initiated and vascular surgery was consulted.  A stent was placed from the left external iliac artery down through his SFA distally on 10/15.  No definite indication for anticoagulation was clinically present and Eliquis was discontinued.   He was discharged only to return as noted 10/24.In the ED no pulse was detected with Doppler.  Empirically he was started on IV antibiotics and IV heparin. 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  Postop acute blood loss anemia with superimposed on chronic anemia with possible iron and B12 deficiency.  Iron level was 14 and ferritin 156.  B12 level was low normal at 264.  He  received 1 unit of packed cells and hemoglobin stabilized.  Iron and B12 supplements were initiated. CBCs were to be monitored as an outpatient serially. Amlodipine/valsartan/HCTZ and Toprol-XL were continued for hypertension which was mildly uncontrolled. He did exhibit leukocytosis but blood cultures were negative x2. His dementia was felt to be at baseline. Dietary/nutrition consultation was recommended for severe protein caloric malnutrition. The patient was to follow-up with vascular surgery in late November.  Past medical and surgical history: As delineated above.  Surgical procedures were vascular in nature.  Social history: Nondrinker.  He has a history of a half a pack a day smoking for 50 years.  Family history: Incomplete   Review of systems:  Could not be completed due to dementia.  He gave the year correctly but could not name the month or the president.  He denied ANY active symptoms including limb pain.  Constitutional: No fever, significant weight change, fatigue  Eyes: No redness, discharge, pain, vision change ENT/mouth: No nasal congestion, purulent discharge, earache, change in hearing, sore throat  Cardiovascular: No chest pain, palpitations, paroxysmal nocturnal dyspnea, claudication, edema  Respiratory: No cough, sputum production, hemoptysis, DOE, significant snoring, apnea Gastrointestinal: No heartburn, dysphagia, abdominal pain, nausea /vomiting, rectal bleeding, melena, change in bowels Genitourinary: No dysuria, hematuria, pyuria, incontinence, nocturia Musculoskeletal: No joint stiffness, joint swelling, weakness, pain Dermatologic: No rash, pruritus, change in appearance of skin Neurologic: No dizziness, headache, syncope, seizures, numbness, tingling Psychiatric: No significant anxiety, depression, insomnia, anorexia Endocrine: No change in hair/skin/nails, excessive thirst, excessive hunger, excessive urination  Hematologic/lymphatic: No significant  bruising, lymphadenopathy, abnormal bleeding Allergy/immunology: No itchy/watery eyes, significant sneezing, urticaria, angioedema  Physical  exam:  Pertinent or positive findings: He appears cachectic with temporal wasting and sunken eyes.  He has an unkempt beard and mustache.  The maxilla is edentulous.  He has a few carious dental remnants of the mandible.  AP diameter is increased.  He has minor scattered rhonchi.  Heart sounds distant. Aorta is palpable.  There is no definite aortic aneurysm. RLE pedal pulses are decreased. Left  BKA is present. Upper extremities are surprisingly strong.  General appearance: no acute distress, increased work of breathing is present.   Lymphatic: No lymphadenopathy about the head, neck, axilla. Eyes: No conjunctival inflammation or lid edema is present. There is no scleral icterus. Ears:  External ear exam shows no significant lesions or deformities.   Nose:  External nasal examination shows no deformity or inflammation. Nasal mucosa are pink and moist without lesions, exudates Oral exam: Lips and gums are healthy appearing.There is no oropharyngeal erythema or exudate. Neck:  No thyromegaly, masses, tenderness noted.    Heart:  Normal rate and regular rhythm. S1 and S2 normal without gallop, murmur, click, rub.  Lungs: without wheezes, rales, rubs. Abdomen: Bowel sounds are normal.  Abdomen is soft and nontender with no organomegaly, hernias, masses. GU: Deferred  Extremities:  No cyanosis, clubbing, edema. Neurologic exam: Balance, Rhomberg, finger to nose testing could not be completed due to clinical state Skin: Warm & dry w/o tenting. No significant lesions or rash.  See clinical summary under each active problem in the Problem List with associated updated therapeutic plan

## 2018-03-08 NOTE — Patient Instructions (Signed)
See assessment and plan under each diagnosis in the problem list and acutely for this visit 

## 2018-03-10 NOTE — Assessment & Plan Note (Signed)
Serial monitor of CBCs

## 2018-03-21 ENCOUNTER — Encounter (HOSPITAL_COMMUNITY): Payer: Medicare Other

## 2018-03-21 ENCOUNTER — Ambulatory Visit: Payer: Medicare Other

## 2018-03-29 ENCOUNTER — Encounter: Payer: Self-pay | Admitting: Internal Medicine

## 2018-03-29 ENCOUNTER — Non-Acute Institutional Stay (SKILLED_NURSING_FACILITY): Payer: Medicare Other | Admitting: Internal Medicine

## 2018-03-29 DIAGNOSIS — R29818 Other symptoms and signs involving the nervous system: Secondary | ICD-10-CM | POA: Diagnosis not present

## 2018-03-29 DIAGNOSIS — I998 Other disorder of circulatory system: Secondary | ICD-10-CM | POA: Diagnosis not present

## 2018-03-29 DIAGNOSIS — R4189 Other symptoms and signs involving cognitive functions and awareness: Secondary | ICD-10-CM

## 2018-03-29 DIAGNOSIS — I10 Essential (primary) hypertension: Secondary | ICD-10-CM

## 2018-03-29 DIAGNOSIS — I70229 Atherosclerosis of native arteries of extremities with rest pain, unspecified extremity: Secondary | ICD-10-CM

## 2018-03-29 NOTE — Progress Notes (Signed)
    NURSING HOME LOCATION:  [facility] ROOM NUMBER:  111-A  CODE STATUS:  Full Code  PCP:  [facility]  [address] [place] [address] [place] [id number]  This is a nursing facility follow up for specific acute issue of limb necrosis.  Interim medical record and care [facility] was updated with review of diagnostic studies and change in clinical status since last visit were documented.  HPI: The CNA was concerned about necrosis of 2 toes of the right foot as he had noted serosanguineous discharge on the patient's sock.V  The patient has had a left BKA for critical peripheral ischemia.He is on Plavix. The patient is severely demented and can give no history.  He denies any pain in the foot or toes.  All responses are a delayed "nope".  Review of systems: Could not be completed due to dementia.  Physical exam:  Pertinent or positive findings: The patient is cachectic in appearance and almost nonverbal as noted above.  Head is shaven.  He is also unshaven.  He has only a few carious mandibular teeth with discoloration and erosions to and below the gum.  Heart and lung sounds are markedly distant and almost not auscultatable.  BKA is present on the left.  The right foot reveals shallow skin ulcers dorsally.  There is a small ulcer over the dorsum of the second right toe.  A deeper ulceration appears to be present at the right great toe @ medial base.  The tissue between the first and second toes of the right foot appears macerated.  There is a subtle foul odor suggested. R Toenails are long and thickened. The R great nail appears black. Rhythmic flexion type tremor is present in the right foot.  The right thigh appears atrophic.  Upper extremities are markedly weak.  General appearance: no acute distress, increased work of breathing is present.   Lymphatic: No lymphadenopathy about the head, neck, axilla. Eyes: No conjunctival inflammation or lid  edema is present. There is no scleral icterus. Ears:  External ear exam shows no significant lesions or deformities.   Nose:  External nasal examination shows no deformity or inflammation. Nasal mucosa are pink and moist without lesions, exudates Neck:  No thyromegaly, masses, tenderness noted.    Abdomen: Bowel sounds are normal. Abdomen is soft and nontender with no organomegaly, hernias, masses. GU: Deferred  Extremities:  No cyanosis, clubbing, edema of RLE. Skin: Warm & dry w/o tenting. No significant rash.  See summary under each active problem in the Problem List with associated updated therapeutic plan

## 2018-03-29 NOTE — Patient Instructions (Signed)
See assessment and plan for this acute visit   

## 2018-03-29 NOTE — Assessment & Plan Note (Addendum)
Intertriginous maceration 1st & 2nd toes and scattered ulcers of various sizes of the right foot Vascular Surgery consult 03/30/2018

## 2018-03-30 ENCOUNTER — Inpatient Hospital Stay (HOSPITAL_COMMUNITY)
Admission: EM | Admit: 2018-03-30 | Discharge: 2018-04-07 | DRG: 853 | Disposition: A | Discharge status: Skilled Nursing Facility | Payer: Medicare Other | Source: Skilled Nursing Facility | Attending: Internal Medicine | Admitting: Internal Medicine

## 2018-03-30 ENCOUNTER — Emergency Department (HOSPITAL_COMMUNITY): Payer: Medicare Other

## 2018-03-30 ENCOUNTER — Encounter (HOSPITAL_COMMUNITY): Payer: Self-pay | Admitting: Emergency Medicine

## 2018-03-30 ENCOUNTER — Encounter: Payer: Self-pay | Admitting: Internal Medicine

## 2018-03-30 ENCOUNTER — Encounter: Payer: Medicare Other | Admitting: Physician Assistant

## 2018-03-30 ENCOUNTER — Other Ambulatory Visit: Payer: Self-pay

## 2018-03-30 DIAGNOSIS — R4189 Other symptoms and signs involving cognitive functions and awareness: Secondary | ICD-10-CM

## 2018-03-30 DIAGNOSIS — Z66 Do not resuscitate: Secondary | ICD-10-CM | POA: Diagnosis present

## 2018-03-30 DIAGNOSIS — I38 Endocarditis, valve unspecified: Secondary | ICD-10-CM

## 2018-03-30 DIAGNOSIS — I330 Acute and subacute infective endocarditis: Secondary | ICD-10-CM | POA: Diagnosis present

## 2018-03-30 DIAGNOSIS — L03115 Cellulitis of right lower limb: Secondary | ICD-10-CM

## 2018-03-30 DIAGNOSIS — I700 Atherosclerosis of aorta: Secondary | ICD-10-CM | POA: Diagnosis present

## 2018-03-30 DIAGNOSIS — F0390 Unspecified dementia without behavioral disturbance: Secondary | ICD-10-CM | POA: Diagnosis present

## 2018-03-30 DIAGNOSIS — I708 Atherosclerosis of other arteries: Secondary | ICD-10-CM | POA: Diagnosis present

## 2018-03-30 DIAGNOSIS — E43 Unspecified severe protein-calorie malnutrition: Secondary | ICD-10-CM

## 2018-03-30 DIAGNOSIS — I10 Essential (primary) hypertension: Secondary | ICD-10-CM | POA: Diagnosis present

## 2018-03-30 DIAGNOSIS — N170 Acute kidney failure with tubular necrosis: Secondary | ICD-10-CM | POA: Diagnosis not present

## 2018-03-30 DIAGNOSIS — Z87891 Personal history of nicotine dependence: Secondary | ICD-10-CM

## 2018-03-30 DIAGNOSIS — Z515 Encounter for palliative care: Secondary | ICD-10-CM | POA: Diagnosis not present

## 2018-03-30 DIAGNOSIS — Z7902 Long term (current) use of antithrombotics/antiplatelets: Secondary | ICD-10-CM

## 2018-03-30 DIAGNOSIS — I96 Gangrene, not elsewhere classified: Secondary | ICD-10-CM | POA: Diagnosis present

## 2018-03-30 DIAGNOSIS — L97509 Non-pressure chronic ulcer of other part of unspecified foot with unspecified severity: Secondary | ICD-10-CM | POA: Diagnosis not present

## 2018-03-30 DIAGNOSIS — K0889 Other specified disorders of teeth and supporting structures: Secondary | ICD-10-CM | POA: Diagnosis not present

## 2018-03-30 DIAGNOSIS — I70229 Atherosclerosis of native arteries of extremities with rest pain, unspecified extremity: Secondary | ICD-10-CM

## 2018-03-30 DIAGNOSIS — F03C0 Unspecified dementia, severe, without behavioral disturbance, psychotic disturbance, mood disturbance, and anxiety: Secondary | ICD-10-CM

## 2018-03-30 DIAGNOSIS — E876 Hypokalemia: Secondary | ICD-10-CM | POA: Diagnosis present

## 2018-03-30 DIAGNOSIS — A4101 Sepsis due to Methicillin susceptible Staphylococcus aureus: Principal | ICD-10-CM | POA: Diagnosis present

## 2018-03-30 DIAGNOSIS — Z681 Body mass index (BMI) 19 or less, adult: Secondary | ICD-10-CM

## 2018-03-30 DIAGNOSIS — N179 Acute kidney failure, unspecified: Secondary | ICD-10-CM | POA: Diagnosis present

## 2018-03-30 DIAGNOSIS — I058 Other rheumatic mitral valve diseases: Secondary | ICD-10-CM | POA: Diagnosis not present

## 2018-03-30 DIAGNOSIS — I059 Rheumatic mitral valve disease, unspecified: Secondary | ICD-10-CM | POA: Diagnosis present

## 2018-03-30 DIAGNOSIS — I739 Peripheral vascular disease, unspecified: Secondary | ICD-10-CM | POA: Diagnosis present

## 2018-03-30 DIAGNOSIS — Z89512 Acquired absence of left leg below knee: Secondary | ICD-10-CM | POA: Diagnosis not present

## 2018-03-30 DIAGNOSIS — D638 Anemia in other chronic diseases classified elsewhere: Secondary | ICD-10-CM | POA: Diagnosis present

## 2018-03-30 DIAGNOSIS — K922 Gastrointestinal hemorrhage, unspecified: Secondary | ICD-10-CM | POA: Diagnosis present

## 2018-03-30 DIAGNOSIS — Z79899 Other long term (current) drug therapy: Secondary | ICD-10-CM

## 2018-03-30 DIAGNOSIS — R6520 Severe sepsis without septic shock: Secondary | ICD-10-CM | POA: Diagnosis not present

## 2018-03-30 DIAGNOSIS — D62 Acute posthemorrhagic anemia: Secondary | ICD-10-CM

## 2018-03-30 DIAGNOSIS — Z79891 Long term (current) use of opiate analgesic: Secondary | ICD-10-CM

## 2018-03-30 DIAGNOSIS — R000 Tachycardia, unspecified: Secondary | ICD-10-CM | POA: Diagnosis not present

## 2018-03-30 DIAGNOSIS — E871 Hypo-osmolality and hyponatremia: Secondary | ICD-10-CM

## 2018-03-30 DIAGNOSIS — I7025 Atherosclerosis of native arteries of other extremities with ulceration: Secondary | ICD-10-CM | POA: Diagnosis not present

## 2018-03-30 DIAGNOSIS — L97529 Non-pressure chronic ulcer of other part of left foot with unspecified severity: Secondary | ICD-10-CM | POA: Diagnosis present

## 2018-03-30 DIAGNOSIS — G8918 Other acute postprocedural pain: Secondary | ICD-10-CM | POA: Diagnosis not present

## 2018-03-30 DIAGNOSIS — Z7982 Long term (current) use of aspirin: Secondary | ICD-10-CM

## 2018-03-30 DIAGNOSIS — D72829 Elevated white blood cell count, unspecified: Secondary | ICD-10-CM | POA: Diagnosis not present

## 2018-03-30 DIAGNOSIS — R195 Other fecal abnormalities: Secondary | ICD-10-CM | POA: Diagnosis present

## 2018-03-30 DIAGNOSIS — A419 Sepsis, unspecified organism: Secondary | ICD-10-CM | POA: Diagnosis present

## 2018-03-30 DIAGNOSIS — E860 Dehydration: Secondary | ICD-10-CM | POA: Diagnosis present

## 2018-03-30 DIAGNOSIS — Z7189 Other specified counseling: Secondary | ICD-10-CM | POA: Diagnosis not present

## 2018-03-30 DIAGNOSIS — R29818 Other symptoms and signs involving the nervous system: Secondary | ICD-10-CM | POA: Insufficient documentation

## 2018-03-30 DIAGNOSIS — I998 Other disorder of circulatory system: Secondary | ICD-10-CM | POA: Diagnosis present

## 2018-03-30 DIAGNOSIS — B9561 Methicillin susceptible Staphylococcus aureus infection as the cause of diseases classified elsewhere: Secondary | ICD-10-CM | POA: Diagnosis not present

## 2018-03-30 DIAGNOSIS — R7881 Bacteremia: Secondary | ICD-10-CM | POA: Diagnosis not present

## 2018-03-30 HISTORY — DX: Gastrointestinal hemorrhage, unspecified: K92.2

## 2018-03-30 LAB — GLUCOSE, CAPILLARY
GLUCOSE-CAPILLARY: 63 mg/dL — AB (ref 70–99)
GLUCOSE-CAPILLARY: 156 mg/dL — AB (ref 70–99)

## 2018-03-30 LAB — CBC WITH DIFFERENTIAL/PLATELET
Abs Immature Granulocytes: 0.79 10*3/uL — ABNORMAL HIGH (ref 0.00–0.07)
BASOS PCT: 0 %
Basophils Absolute: 0.1 10*3/uL (ref 0.0–0.1)
EOS ABS: 0 10*3/uL (ref 0.0–0.5)
EOS PCT: 0 %
HCT: 26.5 % — ABNORMAL LOW (ref 39.0–52.0)
HEMOGLOBIN: 8.3 g/dL — AB (ref 13.0–17.0)
Immature Granulocytes: 3 %
Lymphocytes Relative: 4 %
Lymphs Abs: 1 10*3/uL (ref 0.7–4.0)
MCH: 26.4 pg (ref 26.0–34.0)
MCHC: 31.3 g/dL (ref 30.0–36.0)
MCV: 84.4 fL (ref 80.0–100.0)
MONOS PCT: 4 %
Monocytes Absolute: 1 10*3/uL (ref 0.1–1.0)
Neutro Abs: 23.1 10*3/uL — ABNORMAL HIGH (ref 1.7–7.7)
Neutrophils Relative %: 89 %
Platelets: 482 10*3/uL — ABNORMAL HIGH (ref 150–400)
RBC: 3.14 MIL/uL — ABNORMAL LOW (ref 4.22–5.81)
RDW: 17 % — ABNORMAL HIGH (ref 11.5–15.5)
WBC: 26 10*3/uL — ABNORMAL HIGH (ref 4.0–10.5)
nRBC: 0 % (ref 0.0–0.2)

## 2018-03-30 LAB — I-STAT CG4 LACTIC ACID, ED
LACTIC ACID, VENOUS: 4.77 mmol/L — AB (ref 0.5–1.9)
Lactic Acid, Venous: 2.96 mmol/L (ref 0.5–1.9)

## 2018-03-30 LAB — COMPREHENSIVE METABOLIC PANEL
ALBUMIN: 2 g/dL — AB (ref 3.5–5.0)
ALK PHOS: 246 U/L — AB (ref 38–126)
ALT: 124 U/L — AB (ref 0–44)
AST: 129 U/L — AB (ref 15–41)
Anion gap: 14 (ref 5–15)
BILIRUBIN TOTAL: 0.5 mg/dL (ref 0.3–1.2)
BUN: 80 mg/dL — AB (ref 8–23)
CALCIUM: 8.9 mg/dL (ref 8.9–10.3)
CO2: 17 mmol/L — ABNORMAL LOW (ref 22–32)
CREATININE: 2.42 mg/dL — AB (ref 0.61–1.24)
Chloride: 105 mmol/L (ref 98–111)
GFR calc Af Amer: 30 mL/min — ABNORMAL LOW (ref >60)
GFR, EST NON AFRICAN AMERICAN: 26 mL/min — AB (ref >60)
Glucose, Bld: 200 mg/dL — ABNORMAL HIGH (ref 70–99)
POTASSIUM: 3.6 mmol/L (ref 3.5–5.1)
Sodium: 136 mmol/L (ref 135–145)
TOTAL PROTEIN: 6.9 g/dL (ref 6.5–8.1)

## 2018-03-30 LAB — HEMOGLOBIN AND HEMATOCRIT, BLOOD
HCT: 25.6 % — ABNORMAL LOW (ref 39.0–52.0)
Hemoglobin: 8.8 g/dL — ABNORMAL LOW (ref 13.0–17.0)

## 2018-03-30 LAB — LACTIC ACID, PLASMA: Lactic Acid, Venous: 1.7 mmol/L (ref 0.5–1.9)

## 2018-03-30 LAB — PROTIME-INR
INR: 1.46
Prothrombin Time: 17.5 seconds — ABNORMAL HIGH (ref 11.4–15.2)

## 2018-03-30 LAB — PREPARE RBC (CROSSMATCH)

## 2018-03-30 LAB — POC OCCULT BLOOD, ED: FECAL OCCULT BLD: POSITIVE — AB

## 2018-03-30 MED ORDER — SODIUM CHLORIDE 0.9 % IV SOLN
80.0000 mg | Freq: Once | INTRAVENOUS | Status: AC
  Administered 2018-03-30: 80 mg via INTRAVENOUS
  Filled 2018-03-30: qty 80

## 2018-03-30 MED ORDER — LACTATED RINGERS IV BOLUS
500.0000 mL | Freq: Once | INTRAVENOUS | Status: AC
  Administered 2018-03-30: 500 mL via INTRAVENOUS

## 2018-03-30 MED ORDER — ONDANSETRON HCL 4 MG PO TABS: 4.0000 mg | ORAL_TABLET | Freq: Four times a day (QID) | ORAL | Status: DC | PRN

## 2018-03-30 MED ORDER — ENSURE ENLIVE PO LIQD
237.0000 mL | Freq: Two times a day (BID) | ORAL | Status: DC
  Administered 2018-04-01 – 2018-04-07 (×11): 237 mL via ORAL
  Filled 2018-03-30: qty 237

## 2018-03-30 MED ORDER — SODIUM CHLORIDE 0.9 % IV BOLUS (SEPSIS)
500.0000 mL | Freq: Once | INTRAVENOUS | Status: AC
  Administered 2018-03-30: 500 mL via INTRAVENOUS

## 2018-03-30 MED ORDER — CLOPIDOGREL BISULFATE 75 MG PO TABS: 75.0000 mg | ORAL_TABLET | Freq: Every day | ORAL | Status: DC

## 2018-03-30 MED ORDER — VITAMIN B-12 1000 MCG PO TABS
1000.0000 ug | ORAL_TABLET | Freq: Every day | ORAL | Status: DC
  Administered 2018-04-01 – 2018-04-07 (×7): 1000 ug via ORAL
  Filled 2018-03-30 (×7): qty 1

## 2018-03-30 MED ORDER — SODIUM CHLORIDE 0.9 % IV BOLUS (SEPSIS)
250.0000 mL | Freq: Once | INTRAVENOUS | Status: AC
  Administered 2018-03-30: 250 mL via INTRAVENOUS

## 2018-03-30 MED ORDER — HEPARIN (PORCINE) 25000 UT/250ML-% IV SOLN
1100.0000 [IU]/h | INTRAVENOUS | Status: DC
  Administered 2018-03-30: 900 [IU]/h via INTRAVENOUS
  Filled 2018-03-30: qty 250

## 2018-03-30 MED ORDER — SODIUM CHLORIDE 0.9 % IV SOLN
INTRAVENOUS | Status: DC | PRN
  Administered 2018-03-31: 100 mL via INTRAVENOUS

## 2018-03-30 MED ORDER — SODIUM CHLORIDE 0.9 % IV SOLN
8.0000 mg/h | INTRAVENOUS | Status: DC
  Administered 2018-03-30 – 2018-04-01 (×4): 8 mg/h via INTRAVENOUS
  Filled 2018-03-30 (×6): qty 80

## 2018-03-30 MED ORDER — HYDROCODONE-ACETAMINOPHEN 5-325 MG PO TABS: 1.0000 | ORAL_TABLET | Freq: Three times a day (TID) | ORAL | Status: DC | PRN

## 2018-03-30 MED ORDER — LACTATED RINGERS IV SOLN
INTRAVENOUS | Status: DC
  Administered 2018-03-30: 18:00:00 via INTRAVENOUS

## 2018-03-30 MED ORDER — SODIUM CHLORIDE 0.9 % IV SOLN
1.0000 g | INTRAVENOUS | Status: DC
  Administered 2018-03-31: 1 g via INTRAVENOUS
  Filled 2018-03-30: qty 1

## 2018-03-30 MED ORDER — SODIUM CHLORIDE 0.9% IV SOLUTION: Freq: Once | INTRAVENOUS | Status: DC

## 2018-03-30 MED ORDER — SODIUM CHLORIDE 0.9 % IV SOLN
2.0000 g | INTRAVENOUS | Status: DC
  Administered 2018-03-30: 2 g via INTRAVENOUS
  Filled 2018-03-30: qty 20

## 2018-03-30 MED ORDER — VANCOMYCIN HCL IN DEXTROSE 1-5 GM/200ML-% IV SOLN
1000.0000 mg | Freq: Once | INTRAVENOUS | Status: AC
  Administered 2018-03-30: 1000 mg via INTRAVENOUS
  Filled 2018-03-30: qty 200

## 2018-03-30 MED ORDER — METHOCARBAMOL 500 MG PO TABS
500.0000 mg | ORAL_TABLET | Freq: Every day | ORAL | Status: DC | PRN
  Administered 2018-04-04: 500 mg via ORAL
  Filled 2018-03-30: qty 1

## 2018-03-30 MED ORDER — ONDANSETRON HCL 4 MG/2ML IJ SOLN: 4.0000 mg | Freq: Four times a day (QID) | INTRAMUSCULAR | Status: DC | PRN

## 2018-03-30 MED ORDER — DEXTROSE-NACL 5-0.9 % IV SOLN
INTRAVENOUS | Status: DC
  Administered 2018-03-30 – 2018-03-31 (×3): via INTRAVENOUS

## 2018-03-30 MED ORDER — ENOXAPARIN SODIUM 40 MG/0.4ML ~~LOC~~ SOLN: 40.0000 mg | SUBCUTANEOUS | Status: DC

## 2018-03-30 MED ORDER — DEXTROSE 50 % IV SOLN
INTRAVENOUS | Status: AC
  Administered 2018-03-30: 25 mL
  Filled 2018-03-30: qty 50

## 2018-03-30 MED ORDER — ACETAMINOPHEN 325 MG PO TABS
650.0000 mg | ORAL_TABLET | Freq: Four times a day (QID) | ORAL | Status: DC | PRN
  Administered 2018-04-01 (×2): 650 mg via ORAL
  Filled 2018-03-30 (×3): qty 2

## 2018-03-30 MED ORDER — ACETAMINOPHEN 650 MG RE SUPP
650.0000 mg | Freq: Four times a day (QID) | RECTAL | Status: DC | PRN
  Administered 2018-03-30 – 2018-04-01 (×2): 650 mg via RECTAL
  Filled 2018-03-30 (×3): qty 1

## 2018-03-30 MED ORDER — ASPIRIN EC 81 MG PO TBEC
81.0000 mg | DELAYED_RELEASE_TABLET | Freq: Every day | ORAL | Status: DC
  Administered 2018-04-01 – 2018-04-07 (×7): 81 mg via ORAL
  Filled 2018-03-30 (×7): qty 1

## 2018-03-30 MED ORDER — OXYCODONE HCL 5 MG PO TABS: 5.0000 mg | ORAL_TABLET | ORAL | Status: DC | PRN

## 2018-03-30 MED ORDER — POLYETHYLENE GLYCOL 3350 17 G PO PACK
17.0000 g | PACK | Freq: Every day | ORAL | Status: DC
  Administered 2018-04-02 – 2018-04-07 (×5): 17 g via ORAL
  Filled 2018-03-30 (×6): qty 1

## 2018-03-30 MED ORDER — SODIUM CHLORIDE 0.9 % IV BOLUS (SEPSIS)
1000.0000 mL | Freq: Once | INTRAVENOUS | Status: AC
  Administered 2018-03-30: 1000 mL via INTRAVENOUS

## 2018-03-30 MED ORDER — POLYETHYLENE GLYCOL 3350 17 G PO PACK: 17.0000 g | PACK | Freq: Every day | ORAL | Status: DC | PRN

## 2018-03-30 NOTE — Progress Notes (Signed)
Malik Stanton is a 67 y.o. male patient admitted from ED awake, alert - oriented  X 2 - no acute distress noted.  VSS - Blood pressure (!) 107/57, pulse (!) 136, temperature (!) 101.2 F (38.4 C), temperature source Oral, resp. rate 10, height 5\' 10"  (1.778 m), weight 49.8 kg, SpO2 100 %.    IV in place, occlusive dsg intact without redness.  Orientation to room, and floor completed with information packet given to patient/family.  Patient declined safety video at this time.  Admission INP armband ID verified with patient/family, and in place.   SR up x 2, fall assessment complete, with patient and family able to verbalize understanding of risk associated with falls, and verbalized understanding to call nsg before up out of bed.  Call light within reach, patient able to voice, and demonstrate understanding.  Skin, clean-dry- intact without evidence of bruising, or skin tears.     Left BKA with staples in place. Right necrotic foot.    Will cont to eval and treat per MD orders.  Garry Heaterannon D Taegan Haider, RN 03/30/2018 8:28 PM

## 2018-03-30 NOTE — H&P (View-Only) (Signed)
Hospital Consult    Reason for Consult:  Gangrene of right foot Requesting Physician:  ER MRN #:  [id number]  History of Present Illness: This is a 67 y.o. male who was admitted last month with fevers.  He had an ulceration on the left foot.  He underwent aortogram and was found to have the following findings: Findings: The aorta is patent.  Right iliac arteries common and external appear patent.  On the left side there is a 80% stenosis of the external iliac artery which is resolved to 0% after stenting.  Left SFA is occluded after short takeoff reconstitutes above-knee popliteal.  After stenting there is no further residual stenosis or dissection he has runoff via the posterior tibial.  On the right side he has runoff to the knee via the SFA which is moderately diseased and then his tibial vessels appear to occlude.  He would need concentrated injections from the popliteal artery below the knee to identify the tibial artery occlusions if he were to need intervention there.  After speaking with the granddaughter, Daniel Nones, a consent was obtained and the pt subsequently underwent a left BKA on 03/01/18.  This has healed well and he had an appointment today to remove the staples.    He presented to the ED today with fever, sepsis and GIB and is currently receiving PRBC's for hgb of 8.3.  He is tachycardic.  His lactic acid is elevated.  He has been started on IV abx as well.    Vascular surgery is consulted for gangrene of the right foot.  When we were consulted in October, there were no wounds present on the right foot.  The pt is unable to give information about his foot.  He is unsure of the current year but does follow commands.    He is on a beta blocker, CCB, ARB for blood pressure management.  He is on Plavix.     Past Medical History:  Diagnosis Date  . Blood clot in vein 02/2018  . Dementia (HCC)   . Hypertension   . PVD (peripheral vascular disease) (HCC)     Past Surgical  History:  Procedure Laterality Date  . ABDOMINAL AORTOGRAM W/LOWER EXTREMITY N/A 02/20/2018   Procedure: ABDOMINAL AORTOGRAM W/LOWER EXTREMITY;  Surgeon: Maeola Harman, MD;  Location: [facility] INVASIVE CV LAB;  Service: Cardiovascular;  Laterality: N/A;  bilateral  . AMPUTATION Left 03/02/2018   Procedure: AMPUTATION BELOW KNEE;  Surgeon: Cephus Shelling, MD;  Location: [facility] OR;  Service: Vascular;  Laterality: Left;  . OTHER SURGICAL HISTORY     "I was small when I had a surgery on my legs, arms and across my stomach" (02/20/2018)  . PERIPHERAL VASCULAR INTERVENTION Left 02/20/2018   lt. SFA, lt iliac stents  . PERIPHERAL VASCULAR INTERVENTION  02/20/2018   Procedure: PERIPHERAL VASCULAR INTERVENTION;  Surgeon: Maeola Harman, MD;  Location: [facility] INVASIVE CV LAB;  Service: Cardiovascular;;  lt. SFA, lt iliac stents    No Known Allergies  Prior to Admission medications   Medication Sig Start Date End Date Taking? Authorizing Provider  acetaminophen (TYLENOL) 325 MG tablet Take 2 tablets (650 mg total) by mouth every 6 (six) hours as needed for mild pain (or Fever >/= 101). 03/07/18  Yes Hongalgi, Maximino Greenland, MD  amLODIPine-Valsartan-HCTZ [phone] MG TABS Take 1 tablet by mouth daily.    Yes [provider]  clopidogrel (PLAVIX) 75 MG tablet Take 1 tablet (75 mg total) by mouth daily. Patient taking  differently: Take 75 mg by mouth every evening.  02/23/18  Yes Tyrone Nine, MD  ferrous sulfate 325 (65 FE) MG tablet Take 1 tablet (325 mg total) by mouth 2 (two) times daily with a meal. 03/07/18  Yes Hongalgi, Maximino Greenland, MD  HYDROcodone-acetaminophen (NORCO/VICODIN) 5-325 MG tablet Take 1 tablet by mouth every 8 (eight) hours as needed for moderate pain or severe pain. 03/07/18  Yes Hongalgi, Maximino Greenland, MD  methocarbamol (ROBAXIN) 500 MG tablet Take 500 mg by mouth daily as needed for muscle spasms.    Yes [provider]  metoprolol succinate (TOPROL-XL) 25 MG  24 hr tablet Take 25 mg by mouth daily.  12/06/17  Yes [provider]  NUTRITIONAL SUPPLEMENT LIQD Take 120 mLs by mouth 2 (two) times daily. MedPass   Yes [provider]  polyethylene glycol (MIRALAX / GLYCOLAX) packet Take 17 g by mouth daily. 03/07/18  Yes Hongalgi, Maximino Greenland, MD  vitamin B-12 1000 MCG tablet Take 1 tablet (1,000 mcg total) by mouth daily. 03/08/18  Yes Elease Etienne, MD    Social History   Socioeconomic History  . Marital status: Divorced    Spouse name: Not on file  . Number of children: Not on file  . Years of education: Not on file  . Highest education level: Not on file  Occupational History  . Not on file  Social Needs  . Financial resource strain: Not on file  . Food insecurity:    Worry: Not on file    Inability: Not on file  . Transportation needs:    Medical: Not on file    Non-medical: Not on file  Tobacco Use  . Smoking status: Former Smoker    Packs/day: 0.50    Years: 50.00    Pack years: 25.00    Types: Cigarettes  . Smokeless tobacco: Never Used  Substance and Sexual Activity  . Alcohol use: Never    Frequency: Never  . Drug use: Not Currently  . Sexual activity: Not Currently  Lifestyle  . Physical activity:    Days per week: Not on file    Minutes per session: Not on file  . Stress: Not on file  Relationships  . Social connections:    Talks on phone: Not on file    Gets together: Not on file    Attends religious service: Not on file    Active member of club or organization: Not on file    Attends meetings of clubs or organizations: Not on file    Relationship status: Not on file  . Intimate partner violence:    Fear of current or ex partner: Not on file    Emotionally abused: Not on file    Physically abused: Not on file    Forced sexual activity: Not on file  Other Topics Concern  . Not on file  Social History Narrative  . Not on file    Family Hx:  Unable to obtain due to dementia  ROS: [x]   Positive   [ ]  Negative   [ ]  All sytems reviewed and are negative Pt unable to give ROS  Cardiac: []  chest pain/pressure []  palpitations []  SOB lying flat []  DOE  Vascular: []  pain in legs while walking []  pain in legs at rest []  pain in legs at night [x]  non-healing ulcers-right foot []  hx of DVT []  swelling in legs  Pulmonary: []  productive cough []  asthma/wheezing []  home O2  Neurologic: []  weakness in []   arms []  legs []  numbness in []  arms []  legs []  hx of CVA []  mini stroke [] difficulty speaking or slurred speech []  temporary loss of vision in one eye []  dizziness  Hematologic: []  hx of cancer []  bleeding problems []  problems with blood clotting easily  Endocrine:   []  diabetes []  thyroid disease  GI []  vomiting blood []  blood in stool  GU: []  CKD/renal failure []  HD--[]  M/W/F or []  T/T/S []  burning with urination []  blood in urine  Psychiatric: []  anxiety []  depression  Musculoskeletal: []  arthritis []  joint pain  Integumentary: []  rashes []  ulcers  Constitutional: [x]  fever on admission []  chills   Physical Examination  Vitals:   03/30/18 1409 03/30/18 1419  BP: 97/62 97/62  Pulse: (!) 110   Resp: (!) 22 (!) 27  Temp: 99.4 F (37.4 C)   SpO2:     Body mass index is 15.75 kg/m.  General:  Cachectic male in NAD Gait: Not observed HENT: WNL, normocephalic Pulmonary: normal non-labored breathing Cardiac: regular/tachycardic Abdomen:  soft, NT/ND, no masses Skin: without rashes Vascular Exam/Pulses:  Right Left  Radial 2+ (normal) 2+ (normal)  Femoral 2+ (normal) 2+ (normal)  Popliteal Unable to palpate  Unable to palpate   DP Nurse reports unable to doppler signals (doppler being used in CPR code currently) BKA  PT See above BKA   Extremities: well healed left BKA incision. Right foot: mild foul odor.     Musculoskeletal: no muscle wasting or atrophy  Neurologic: A&O X 3;  No focal weakness or paresthesias are  detected; speech is fluent/normal   CBC    Component Value Date/Time   WBC 26.0 (H) 03/30/2018 1206   RBC 3.14 (L) 03/30/2018 1206   HGB 8.3 (L) 03/30/2018 1206   HCT 26.5 (L) 03/30/2018 1206   PLT 482 (H) 03/30/2018 1206   MCV 84.4 03/30/2018 1206   MCH 26.4 03/30/2018 1206   MCHC 31.3 03/30/2018 1206   RDW 17.0 (H) 03/30/2018 1206   LYMPHSABS 1.0 03/30/2018 1206   MONOABS 1.0 03/30/2018 1206   EOSABS 0.0 03/30/2018 1206   BASOSABS 0.1 03/30/2018 1206    BMET    Component Value Date/Time   NA 136 03/30/2018 1206   K 3.6 03/30/2018 1206   CL 105 03/30/2018 1206   CO2 17 (L) 03/30/2018 1206   GLUCOSE 200 (H) 03/30/2018 1206   BUN 80 (H) 03/30/2018 1206   CREATININE 2.42 (H) 03/30/2018 1206   CALCIUM 8.9 03/30/2018 1206   GFRNONAA 26 (L) 03/30/2018 1206   GFRAA 30 (L) 03/30/2018 1206    COAGS: Lab Results  Component Value Date   INR 1.46 03/30/2018   INR 1.27 02/20/2018   INR 1.33 02/17/2018     Non-Invasive Vascular Imaging:   None currently  Statin:  No. Beta Blocker:  Yes.   Aspirin:  No. ACEI:  No. ARB:  Yes.   CCB use:  Yes Other antiplatelets/anticoagulants:  Yes.   Plavix   ASSESSMENT/PLAN: This is a 67 y.o. male who recently underwent left below knee amputation who presents to ED today with fever, gangrene of right foot and GIB   -pt febrile with leukocytosis of 26k and elevated lactic acid on admission.  He has been started on IV abx and is currently receiving PRBC's for GIB. -unable to get hx about when these wounds started on his right foot, but according to consult note last month, there were no wounds present on the right foot.   He  did have an aortogram last month by Dr. Randie Heinzain and on the RLE, the SFA was moderately diseased and his tibial vessels appear to occlude. Per his findings,  he would need concentrated injections from the popliteal artery below the knee to identify the tibial artery occlusions if intervention warranted.  Would continue  IV abx and if pt is stable, Dr. Randie Heinzain could do aortogram with RLE runoff early next week.  Pt is at high risk for RLE amputation.   -left BKA has healed nicely.  Will order for staples to be removed.   Doreatha MassedSamantha Rhyne, PA-C [facility] [phone]  I have seen and evaluated the patient.  He has new significant wounds on his right foot.  His white count is elevated to 26.  He is having fevers and chills.  He is recently status post left below-knee amputation.  I am concerned that the source of his infection is coming from his right foot.  I discussed with the patient that he will most likely require a right below-knee amputation.  I will see how he does overnight with IV antibiotics.  I am tentatively placing him on the operating room schedule for tomorrow.  He will be n.p.o. after midnight.  I will try to contact his family tonight. I tried to call his grandaughter but left a message.  Durene CalWells [place]

## 2018-03-30 NOTE — ED Notes (Signed)
Vascular PA at bedside  

## 2018-03-30 NOTE — Progress Notes (Addendum)
ANTICOAGULATION CONSULT NOTE - Initial Consult  Pharmacy Consult for Heparin Indication: Ischemic Leg  No Known Allergies  Patient Measurements: Height: 5\' 10"  (177.8 cm) Weight: 109 lb 12.6 oz (49.8 kg) IBW/kg (Calculated) : 73 Heparin Dosing Weight: 49.8 kg  Vital Signs: Temp: 99.2 F (37.3 C) (11/22 1430) Temp Source: Oral (11/22 1430) BP: 124/94 (11/22 1600) Pulse Rate: 53 (11/22 1600)  Labs: Recent Labs    03/30/18 1206 03/30/18 1220  HGB 8.3*  --   HCT 26.5*  --   PLT 482*  --   LABPROT  --  17.5*  INR  --  1.46  CREATININE 2.42*  --     Estimated Creatinine Clearance: 21.2 mL/min (A) (by C-G formula based on SCr of 2.42 mg/dL (H)).   Medical History: Past Medical History:  Diagnosis Date  . Blood clot in vein 02/2018  . Dementia (HCC)   . Hypertension   . PVD (peripheral vascular disease) (HCC)      Assessment: Patient [age] yoM presenting with gangrenous toes in right foot. Pharmacy has been consulted for heparin dosing. Patient was previously on apixaban, but this was stopped during October 2019 admission and patient remains on clopidogrel.   Per GI, will initiate heparin at low dose given history of chronic anemia and occult blood positive stool. Currently, HgB 8.3 and PLT 482.   During last admission, patient received heparin gtt as high as 1500-1700 units/hr. Due to recent BKA and concern for GIB, will start at much lower rate and titrate faster if needed.   Goal of Therapy:  Heparin level 0.3-0.5 units/mL Monitor platelets by anticoagulation protocol: Yes   Plan:  Start heparin infusion at 900 units/hr Check anti-Xa level in 8 hours and daily while on heparin Continue to monitor H&H and platelets Monitor s/sx of bleed  Chauncey Mannebecca Jaelynn Currier, Pharmacy Student

## 2018-03-30 NOTE — H&P (Addendum)
History and Physical    DOA: 03/30/2018  PCP: [facility]  Patient coming from: Skilled nursing facility  Chief Complaint: Gangrenous toes in right foot  HPI: Malik Stanton is a 67 y.o. 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  Patient has staples on the stump with no drainage or bleeding.  He was sent to ED after noted at the nursing facility to have foul-smelling drainage on the sock to the right foot.  His toes appeared black and there was concern for gangrene given history of extensive peripheral vascular disease.  His T-max in the ED has been 99.8 but noted to be shaking like he is having chills although his upper body is not covered with bedsheets hanging down the bed.  Blood pressure in the ED has fluctuated between systolic 95-1 17.  Lab work revealed leukocytosis of 26,000 , lactate of 4 and AKI.  Patient also noted to have black stools, guaiac positive during ED evaluation.  His last hemoglobin in October was 9.6 and now at 8.3.  He is receiving PRBC as ordered through ED in concern for GI bleed. Patient has dementia and he is minimally communicative.  He follows some commands.  He has been evaluated by vascular.  GI has been contacted by ED physician and hospitalist service requested to admit this patient.  No family present bedside and I was unable to reach son and granddaughter at phone numbers listed on the face sheet.   Review of Systems: As per HPI otherwise 10 point review of systems negative.    Past Medical History:  Diagnosis Date  . Blood clot in vein 02/2018  . Dementia (HCC)   . Hypertension   . PVD (peripheral vascular disease) (HCC)     Past Surgical History:  Procedure Laterality Date  . ABDOMINAL AORTOGRAM W/LOWER EXTREMITY N/A 02/20/2018   Procedure: ABDOMINAL  AORTOGRAM W/LOWER EXTREMITY;  Surgeon: Maeola Harman, MD;  Location: [facility] INVASIVE CV LAB;  Service: Cardiovascular;  Laterality: N/A;  bilateral  . AMPUTATION Left 03/02/2018   Procedure: AMPUTATION BELOW KNEE;  Surgeon: Cephus Shelling, MD;  Location: [facility] OR;  Service: Vascular;  Laterality: Left;  . OTHER SURGICAL HISTORY     "I was small when I had a surgery on my legs, arms and across my stomach" (02/20/2018)  . PERIPHERAL VASCULAR INTERVENTION Left 02/20/2018   lt. SFA, lt iliac stents  . PERIPHERAL VASCULAR INTERVENTION  02/20/2018   Procedure: PERIPHERAL VASCULAR INTERVENTION;  Surgeon: Maeola Harman, MD;  Location: [facility] INVASIVE CV LAB;  Service: Cardiovascular;;  lt. SFA, lt iliac stents    Social history:  reports that he has quit smoking. His smoking use included cigarettes. He has a 25.00 pack-year smoking history. He has never used smokeless tobacco. He reports that he has current or past drug history. He reports that he does not drink alcohol.   No Known Allergies  Family history: Unobtainable   Prior to Admission medications   Medication Sig Start Date End Date Taking? Authorizing Provider  acetaminophen (TYLENOL) 325 MG tablet Take 2 tablets (650 mg total) by mouth every 6 (six) hours as needed for mild pain (or Fever >/= 101). 03/07/18  Yes Hongalgi, Maximino Greenland, MD  amLODIPine-Valsartan-HCTZ [phone] MG TABS Take 1 tablet by mouth daily.    Yes [provider]  clopidogrel (PLAVIX) 75 MG tablet Take 1 tablet (75 mg total) by mouth daily. Patient taking differently: Take 75 mg by mouth every evening.  02/23/18  Yes Tyrone NineGrunz, Ryan B, MD  ferrous sulfate 325 (65 FE) MG tablet Take 1 tablet (325 mg total) by mouth 2 (two) times daily with a meal. 03/07/18  Yes Hongalgi, Maximino GreenlandAnand D, MD  HYDROcodone-acetaminophen (NORCO/VICODIN) 5-325 MG tablet Take 1 tablet by mouth every 8 (eight) hours as needed for moderate pain or severe pain. 03/07/18  Yes  Hongalgi, Maximino GreenlandAnand D, MD  methocarbamol (ROBAXIN) 500 MG tablet Take 500 mg by mouth daily as needed for muscle spasms.    Yes [provider]  metoprolol succinate (TOPROL-XL) 25 MG 24 hr tablet Take 25 mg by mouth daily.  12/06/17  Yes [provider]  NUTRITIONAL SUPPLEMENT LIQD Take 120 mLs by mouth 2 (two) times daily. MedPass   Yes [provider]  polyethylene glycol (MIRALAX / GLYCOLAX) packet Take 17 g by mouth daily. 03/07/18  Yes Hongalgi, Maximino GreenlandAnand D, MD  vitamin B-12 1000 MCG tablet Take 1 tablet (1,000 mcg total) by mouth daily. 03/08/18  Yes Elease EtienneHongalgi, Anand D, MD    Physical Exam: Vitals:   03/30/18 1409 03/30/18 1415 03/30/18 1419 03/30/18 1430  BP: 97/62 97/62 97/62  112/64  Pulse: (!) 110   98  Resp: (!) 22 (!) 25 (!) 27 (!) 25  Temp: 99.4 F (37.4 C)   99.2 F (37.3 C)  TempSrc: Oral   Oral  SpO2:    (!) 56%  Weight:      Height:        Constitutional: NAD, calm, comfortable Vitals:   03/30/18 1409 03/30/18 1415 03/30/18 1419 03/30/18 1430  BP: 97/62 97/62 97/62  112/64  Pulse: (!) 110   98  Resp: (!) 22 (!) 25 (!) 27 (!) 25  Temp: 99.4 F (37.4 C)   99.2 F (37.3 C)  TempSrc: Oral   Oral  SpO2:    (!) 56%  Weight:      Height:       Eyes: PERRL, lids and conjunctivae normal ENMT: Mucous membranes are dry. Posterior pharynx clear of any exudate or lesions.Normal dentition.  Neck: normal, supple, no masses, no thyromegaly Respiratory: clear to auscultation bilaterally, no wheezing, no crackles. Normal respiratory effort. No accessory muscle use.  Cardiovascular: Regular rate and rhythm, no murmurs / rubs / gallops. No extremity edema. 2+ pedal pulses. No carotid bruits.  Abdomen: no tenderness, no masses palpated. No hepatosplenomegaly. Bowel sounds positive.  Musculoskeletal: Status post BKA with staples on the left stump.  Right foot gangrenous changes as below.  no palpable dorsalis pedis or posterior tibial pulse on the right  side. Neurologic: Moving all extremities, good handgrip, follows some commands.  Minimally communicative.  Could not test further Psychiatric: Disoriented but calm, could not test further SKIN/catheters: Staples intact along the left BKA stump.  No signs of drainage or infection.  Gangrenous changes along the rt ft dorsum of right foot,    Labs on Admission: I have personally reviewed following labs and imaging studies  CBC: Recent Labs  Lab 03/30/18 1206  WBC 26.0*  NEUTROABS 23.1*  HGB 8.3*  HCT 26.5*  MCV 84.4  PLT 482*   Basic Metabolic Panel: Recent Labs  Lab 03/30/18 1206  NA 136  K 3.6  CL 105  CO2 17*  GLUCOSE 200*  BUN 80*  CREATININE 2.42*  CALCIUM 8.9   GFR: Estimated Creatinine Clearance: 21.2  mL/min (A) (by C-G formula based on SCr of 2.42 mg/dL (H)). Liver Function Tests: Recent Labs  Lab 03/30/18 1206  AST 129*  ALT 124*  ALKPHOS 246*  BILITOT 0.5  PROT 6.9  ALBUMIN 2.0*   No results for input(s): LIPASE, AMYLASE in the last 168 hours. No results for input(s): AMMONIA in the last 168 hours. Coagulation Profile: Recent Labs  Lab 03/30/18 1220  INR 1.46   Cardiac Enzymes: No results for input(s): CKTOTAL, CKMB, CKMBINDEX, TROPONINI in the last 168 hours. BNP (last 3 results) No results for input(s): PROBNP in the last 8760 hours. HbA1C: No results for input(s): HGBA1C in the last 72 hours. CBG: No results for input(s): GLUCAP in the last 168 hours. Lipid Profile: No results for input(s): CHOL, HDL, LDLCALC, TRIG, CHOLHDL, LDLDIRECT in the last 72 hours. Thyroid Function Tests: No results for input(s): TSH, T4TOTAL, FREET4, T3FREE, THYROIDAB in the last 72 hours. Anemia Panel: No results for input(s): VITAMINB12, FOLATE, FERRITIN, TIBC, IRON, RETICCTPCT in the last 72 hours. Urine analysis:    Component Value Date/Time   COLORURINE YELLOW 02/17/2018 2051   APPEARANCEUR CLEAR 02/17/2018 2051   LABSPEC 1.025 02/17/2018 2051   PHURINE  5.5 02/17/2018 2051   GLUCOSEU NEGATIVE 02/17/2018 2051   HGBUR NEGATIVE 02/17/2018 2051   BILIRUBINUR NEGATIVE 02/17/2018 2051   KETONESUR NEGATIVE 02/17/2018 2051   PROTEINUR NEGATIVE 02/17/2018 2051   NITRITE NEGATIVE 02/17/2018 2051   LEUKOCYTESUR NEGATIVE 02/17/2018 2051    Radiological Exams on Admission: Dg Chest Port 1 View  Result Date: 03/30/2018 CLINICAL DATA:  Hypotension. Possible sepsis. EXAM: PORTABLE CHEST 1 VIEW COMPARISON:  02/17/2018. FINDINGS: Normal sized heart. Mildly tortuous and calcified thoracic aorta. Clear lungs with normal vascularity. Bilateral AC joint degenerative changes. IMPRESSION: No acute abnormality. Electronically Signed   By: Beckie Salts M.D.   On: 03/30/2018 11:57    EKG: Not performed yet     Assessment and Plan:   1.  Right foot ischemia with gangrenous changes: Seen by vascular and may need intervention next week.  Currently not a candidate for anticoagulation given concern for GI bleed and not a candidate for angiogram given AKI. Patient with poor prognosis.  Will hold Plavix and prescribe enteric-coated baby aspirin.  Appreciate vascular evaluation who will follow along.  2.  Upper GI bleed: Patient receiving PRBC in the ED.  Check posttransfusion hemoglobin.  Holding Plavix and avoiding anticoagulants  3.  Sepsis syndrome: Patient has significant leukocytosis with hypotension, tachycardia and elevated lactate.  All these can be expected in the setting of ischemic limb/infection.  Empiric broad-spectrum antibiotics.  Follow blood cultures.  Afebrile here.  4.  Hypertension: Hold beta-blockers, Norvasc, hydrochlorothiazide, ACE inhibitors in view of current hypotension.  Blood pressure appears to be improving with hydration.  May resume medications as deemed appropriate in a.m.  5. Dementia: Resume home medications  6.  Acute renal failure: Likely secondary to dehydration, diuretics, poor oral intake and infection.  Hold diuretics/ACE  inhibitors.  Elevated BUN could be in the setting of GI bleed as well.  Serial labs while hydrating.  7.  Elevated LFTs: Could be secondary to infection versus shock liver in the setting of hypotension.  Hold hepatotoxic medications.  Labs in a.m.  DVT prophylaxis: Cannot tolerate Lovenox with GI bleed and cannot tolerate SCD with ischemic limb  Code Status: Full code for now  Family Communication: Called and left messages for both son and granddaughter.  No answer yet.  Consult palliative  care evaluation for care goal discussions. Consults called: Vascular surgery and GI contacted by ED physician Admission status:  Patient admitted as inpatient to stepdown status as anticipated LOS greater than 2 midnights    Alessandra Bevels MD [facility] Pager [phone]    03/30/2018, 3:27 PM   Addendum 4 pm: Discussed with granddaughter Portia who called me back.  She understands overall poor prognosis.  She stated she has been the primary caregiver for this patient and she would not want any aggressive resuscitative interventions in case of medical emergency.  She is also willing to meet with palliative care team for further goal discussions.  DNR order placed and palliative care consulted.  Also noted GI recommendations for low-dose heparin.  Pharmacy request sent.

## 2018-03-30 NOTE — Assessment & Plan Note (Signed)
BP controlled; no change in antihypertensive medications  

## 2018-03-30 NOTE — ED Notes (Signed)
Pt remains unchanged at 15 minute after 1st unit PRBCs started

## 2018-03-30 NOTE — ED Triage Notes (Addendum)
[facility] with c/o decreased LOC- low blood pressure and right foot is black and cold. Recent BKA with staples still intact to left leg Pt has black/tarry  Stool on rectal exam/temp

## 2018-03-30 NOTE — ED Provider Notes (Signed)
[facility] Provider Note   CSN: [id number] Arrival date & time: 03/30/18  1133     History   Chief Complaint Chief Complaint  Patient presents with  . Blood Infection    HPI Malik Stanton is a 67 y.o. male.  Pt presents to the ED today with possible sepsis.  The pt comes from [facility].  He had a left BKA on 10/25 by Dr. Chestine Sporelark and was d/c to the SNF on 10/30.  The pt did have an aortogram with bilateral LE runoff on 10/15 by Dr. Randie Heinzain.  The right side had moderate disease of the SFA and occluded tibial vessels.  The left SFA was occluded.  They did not intervene on the right as he was asymptomatic then.  The SNF noticed gangrene to his right foot yesterday, so they requested a vascular consult.  Today, his bp was in the 80s, so they sent him to the ED.  No fever at the SNF.  Pt is on plavix.  Pt has dementia and is a poor historian.  He denies any pain.     Past Medical History:  Diagnosis Date  . Blood clot in vein 02/2018  . Dementia (HCC)   . Hypertension   . PVD (peripheral vascular disease) [facility]HCC)     Patient Active Problem List   Diagnosis Date Noted  . Neurocognitive deficits 03/30/2018  . Essential hypertension 03/01/2018  . PVD (peripheral vascular disease) (HCC) 03/01/2018  . Gangrene of left foot (HCC) 03/01/2018  . Normocytic anemia 03/01/2018  . 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 Critical lower limb ischemia 02/18/2018  . Hypokalemia 02/18/2018    Past Surgical History:  Procedure Laterality Date  . ABDOMINAL AORTOGRAM W/LOWER EXTREMITY N/A 02/20/2018   Procedure: ABDOMINAL AORTOGRAM W/LOWER EXTREMITY;  Surgeon: Maeola Harmanain, Brandon Christopher, MD;  [facility];  Service: Cardiovascular;  Laterality: N/A;  bilateral  . AMPUTATION Left 03/02/2018   Procedure: AMPUTATION BELOW KNEE;  Surgeon: Cephus Shellinglark, Christopher J, MD;  [facility];  Service: Vascular;  Laterality: Left;    . OTHER SURGICAL HISTORY     "I was small when I had a surgery on my legs, arms and across my stomach" (02/20/2018)  . PERIPHERAL VASCULAR INTERVENTION Left 02/20/2018   lt. SFA, lt iliac stents  . PERIPHERAL VASCULAR INTERVENTION  02/20/2018   Procedure: PERIPHERAL VASCULAR INTERVENTION;  Surgeon: Maeola Harmanain, Brandon Christopher, MD;  [facility];  Service: Cardiovascular;;  lt. SFA, lt iliac stents        Home Medications    Prior to Admission medications   Medication Sig Start Date End Date Taking? Authorizing Provider  acetaminophen (TYLENOL) 325 MG tablet Take 2 tablets (650 mg total) by mouth every 6 (six) hours as needed for mild pain (or Fever >/= 101). 03/07/18  Yes Hongalgi, Maximino GreenlandAnand D, MD  amLODIPine-Valsartan-HCTZ [phone] MG TABS Take 1 tablet by mouth daily.    Yes [provider]  clopidogrel (PLAVIX) 75 MG tablet Take 1 tablet (75 mg total) by mouth daily. Patient taking differently: Take 75 mg by mouth every evening.  02/23/18  Yes Tyrone NineGrunz, Ryan B, MD  ferrous sulfate 325 (65 FE) MG tablet Take 1 tablet (325 mg total) by mouth 2 (two) times daily with a meal. 03/07/18  Yes Hongalgi, Maximino GreenlandAnand D, MD  HYDROcodone-acetaminophen (NORCO/VICODIN) 5-325 MG tablet Take 1 tablet by mouth every 8 (eight) hours as needed for moderate pain  or severe pain. 03/07/18  Yes Hongalgi, Maximino Greenland, MD  methocarbamol (ROBAXIN) 500 MG tablet Take 500 mg by mouth daily as needed for muscle spasms.    Yes [provider]  metoprolol succinate (TOPROL-XL) 25 MG 24 hr tablet Take 25 mg by mouth daily.  12/06/17  Yes [provider]  NUTRITIONAL SUPPLEMENT LIQD Take 120 mLs by mouth 2 (two) times daily. MedPass   Yes [provider]  polyethylene glycol (MIRALAX / GLYCOLAX) packet Take 17 g by mouth daily. 03/07/18  Yes Hongalgi, Maximino Greenland, MD  vitamin B-12 1000 MCG tablet Take 1 tablet (1,000 mcg total) by mouth daily. 03/08/18  Yes Hongalgi, Maximino Greenland, MD     Family History No family history on file.  Social History Social History   Tobacco Use  . Smoking status: Former Smoker    Packs/day: 0.50    Years: 50.00    Pack years: 25.00    Types: Cigarettes  . Smokeless tobacco: Never Used  Substance Use Topics  . Alcohol use: Never    Frequency: Never  . Drug use: Not Currently     Allergies   Patient has no known allergies.   Review of Systems Review of Systems  Skin: Positive for color change.  All other systems reviewed and are negative.    Physical Exam Updated Vital Signs BP 112/64   Pulse 98   Temp 99.2 F (37.3 C) (Oral)   Resp (!) 25   Ht 5\' 10"  (1.778 m)   Wt 49.8 kg   SpO2 (!) 56%   BMI 15.75 kg/m   Physical Exam  Constitutional: He appears well-developed and well-nourished.  HENT:  Head: Normocephalic and atraumatic.  Right Ear: External ear normal.  Left Ear: External ear normal.  Nose: Nose normal.  Mouth/Throat: Oropharynx is clear and moist.  Eyes: Pupils are equal, round, and reactive to light. Conjunctivae and EOM are normal.  Neck: Normal range of motion. Neck supple.  Cardiovascular: Normal rate, regular rhythm and normal heart sounds.  No pulse to right foot  Pulmonary/Chest: Effort normal and breath sounds normal.  Abdominal: Soft. Bowel sounds are normal.  Genitourinary: Rectal exam shows guaiac positive stool.  Genitourinary Comments: Stool is black  Neurological: He is alert.  Skin: Skin is warm.  Right dorsum of foot and right 1st toe with dry gangrene.  See picture.  Psychiatric: He has a normal mood and affect.  Nursing note and vitals reviewed.      ED Treatments / Results  Labs (all labs ordered are listed, but only abnormal results are displayed) Labs Reviewed  COMPREHENSIVE METABOLIC PANEL - Abnormal; Notable for the following components:      Result Value   CO2 17 (*)    Glucose, Bld 200 (*)    BUN 80 (*)    Creatinine, Ser 2.42 (*)    Albumin 2.0 (*)    AST  129 (*)    ALT 124 (*)    Alkaline Phosphatase 246 (*)    GFR calc non Af Amer 26 (*)    GFR calc Af Amer 30 (*)    All other components within normal limits  CBC WITH DIFFERENTIAL/PLATELET - Abnormal; Notable for the following components:   WBC 26.0 (*)    RBC 3.14 (*)    Hemoglobin 8.3 (*)    HCT 26.5 (*)    RDW 17.0 (*)    Platelets 482 (*)    Neutro Abs 23.1 (*)  Abs Immature Granulocytes 0.79 (*)    All other components within normal limits  PROTIME-INR - Abnormal; Notable for the following components:   Prothrombin Time 17.5 (*)    All other components within normal limits  I-STAT CG4 LACTIC ACID, ED - Abnormal; Notable for the following components:   Lactic Acid, Venous 4.77 (*)    All other components within normal limits  POC OCCULT BLOOD, ED - Abnormal; Notable for the following components:   Fecal Occult Bld POSITIVE (*)    All other components within normal limits  I-STAT CG4 LACTIC ACID, ED - Abnormal; Notable for the following components:   Lactic Acid, Venous 2.96 (*)    All other components within normal limits  CULTURE, BLOOD (ROUTINE X 2)  CULTURE, BLOOD (ROUTINE X 2)  URINE CULTURE  URINALYSIS, ROUTINE W REFLEX MICROSCOPIC  TYPE AND SCREEN  PREPARE RBC (CROSSMATCH)    EKG None  Radiology Dg Chest Port 1 View  Result Date: 03/30/2018 CLINICAL DATA:  Hypotension. Possible sepsis. EXAM: PORTABLE CHEST 1 VIEW COMPARISON:  02/17/2018. FINDINGS: Normal sized heart. Mildly tortuous and calcified thoracic aorta. Clear lungs with normal vascularity. Bilateral AC joint degenerative changes. IMPRESSION: No acute abnormality. Electronically Signed   By: Beckie Salts M.D.   On: 03/30/2018 11:57    Procedures Procedures (including critical care time)  Medications Ordered in ED Medications  cefTRIAXone (ROCEPHIN) 2 g in sodium chloride 0.9 % 100 mL IVPB (0 g Intravenous Stopped 03/30/18 1252)  pantoprazole (PROTONIX) 80 mg in sodium chloride 0.9 % 250 mL  (0.32 mg/mL) infusion (8 mg/hr Intravenous New Bag/Given 03/30/18 1332)  0.9 %  sodium chloride infusion (Manually program via Guardrails IV Fluids) (has no administration in time range)  sodium chloride 0.9 % bolus 1,000 mL (0 mLs Intravenous Stopped 03/30/18 1253)    And  sodium chloride 0.9 % bolus 500 mL (0 mLs Intravenous Stopped 03/30/18 1215)    And  sodium chloride 0.9 % bolus 250 mL (0 mLs Intravenous Stopped 03/30/18 1409)  pantoprazole (PROTONIX) 80 mg in sodium chloride 0.9 % 100 mL IVPB (0 mg Intravenous Stopped 03/30/18 1409)     Initial Impression / Assessment and Plan / ED Course  I have reviewed the triage vital signs and the nursing notes.  Pertinent labs & imaging results that were available during my care of the patient were reviewed by me and considered in my medical decision making (see chart for details).   Code sepsis initially called due to gangrenous right foot with mild cellulitis.  He was hypotensive with tachycardia, but no fever.  He was given 2g rocephin IV.  I think pt is hypotensive and tachycardic due to GI bleed.  He is on plavix.  Hgb has dropped a gram since it was last checked on 10/28.  Protonix drip ordered and 2 units of prbcs ordered.  Pt unable to tell me if he's ever seen GI and there is no family and no record in epic of GI.  Pt d/w Dr. Dulce Sellar who will see him in consult.  Pt d/w Dr. Myra Gianotti (vascular) via the OR nurse.  He will see pt when he is out of the OR.  Pt unable to have heparin due to GIB.  Pt also in ARF, so unable to have a ct angiogram.  Lactic acid elevation is multifactorial.  ? Early sepsis, GIB, necrotic foot, dehydration  Pt d/w triad hospitalists (Dr. Lajuana Ripple) for admission.  CRITICAL CARE Performed by: Jacalyn Lefevre  Total critical care time: 60 minutes  Critical care time was exclusive of separately billable procedures and treating other patients.  Critical care was necessary to treat or prevent imminent or  life-threatening deterioration.  Critical care was time spent personally by me on the following activities: development of treatment plan with patient and/or surrogate as well as nursing, discussions with consultants, evaluation of patient's response to treatment, examination of patient, obtaining history from patient or surrogate, ordering and performing treatments and interventions, ordering and review of laboratory studies, ordering and review of radiographic studies, pulse oximetry and re-evaluation of patient's condition.   Final Clinical Impressions(s) / ED Diagnoses   Final diagnoses:  Gangrene of foot (HCC)  Cellulitis of right lower extremity  Acute GI bleeding  Acute post-hemorrhagic anemia  AKI (acute kidney injury) (HCC)  PVD (peripheral vascular disease) (HCC)  Critical lower limb ischemia  Long term current use of clopidogrel    ED Discharge Orders    None       Jacalyn Lefevre, MD 03/30/18 1446

## 2018-03-30 NOTE — Significant Event (Signed)
Rapid Response Event Note  Overview:  Called to assist with sepsis patient  Time Called: 1834 Arrival Time: 1837 Event Type: Other (Comment)  Initial Focused Assessment:  Lying in bed - eyes open - will focus and answer few yes/no questions - rigors noted - patient states he is cold - rectal temp 101.9  Skin hot and dry - resps rapid but not labored - ST on monitor 132 - right foot with necrotic toes and dorsal area necrotic - LR infusing along with Protonix drip.  BP now 106/74 difficult to obtain O2 sats due to rigors and cold fingertips - placed on head - poor pleth.  CBG 63.    Interventions:  Placed covers to control rigors - LR bolus per MD order - treated blood sugar with 1/2 amp D50.  Dr. Lajuana RippleKamineni to bedside - update given and treatment plan was clarified - will treat medically as ordered with DRN status if declines - if patient fails to respond will move toward comfort care per MD update = she has spoken with granddaughter.  Patient more comfortable - rigors some better.  IV's patent.  Abx infusing.    Plan of Care (if not transferred):  Remains SDU status - frequent VS and monitor blood sugar.  Per MD APP will follow up.  To call RRT as needed.    Event Summary: Name of Physician Notified: Dr. Lajuana RippleKamineni at (pta RRT)    at    Outcome: Stayed in room and stabalized, Code status clarified  Event End Time: 1910  Malik PrairieBritt, Malik Stanton

## 2018-03-30 NOTE — Consult Note (Signed)
Referring Provider:  EDP Primary Care Physician:  [facility] Primary Gastroenterologist: Gentry Fitz  Reason for Consultation: Occult blood positive stool, anemia  HPI: Malik Stanton is a 67 y.o. male with past medical history of critical limb ischemia, history of dementia, history of peripheral arterial disease admitted to the hospital for possible sepsis.  Patient was previously on Eliquis after he was discharged from [facility].  But currently on Plavix.  There was initial concern for hypotension from GI bleed.  GI is consulted for further evaluation.  Patient seen and examined at bedside.  Patient is not able to give detailed history.  Discussed with the nursing staff.  There is no evidence of overt bleeding.Patient with history of chronic anemia.  His hemoglobin was as low as 7 during hospitalization in October 2019.  Iron studies at that time showed low iron and low TIBC as well as normal ferritin consistent with anemia of chronic disease.  He was found to have occult blood positive during this admission.  He has significant leukocytosis as well as elevated lactate level possibly from underlying sepsis.   Past Medical History:  Diagnosis Date  . Blood clot in vein 02/2018  . Dementia (HCC)   . Hypertension   . PVD (peripheral vascular disease) (HCC)     Past Surgical History:  Procedure Laterality Date  . ABDOMINAL AORTOGRAM W/LOWER EXTREMITY N/A 02/20/2018   Procedure: ABDOMINAL AORTOGRAM W/LOWER EXTREMITY;  Surgeon: Maeola Harman, MD;  Location: [facility] INVASIVE CV LAB;  Service: Cardiovascular;  Laterality: N/A;  bilateral  . AMPUTATION Left 03/02/2018   Procedure: AMPUTATION BELOW KNEE;  Surgeon: Cephus Shelling, MD;  Location: [facility] OR;  Service: Vascular;  Laterality: Left;  . OTHER SURGICAL HISTORY     "I was small when I had a surgery on my legs, arms and across my stomach" (02/20/2018)  . PERIPHERAL VASCULAR INTERVENTION Left  02/20/2018   lt. SFA, lt iliac stents  . PERIPHERAL VASCULAR INTERVENTION  02/20/2018   Procedure: PERIPHERAL VASCULAR INTERVENTION;  Surgeon: Maeola Harman, MD;  Location: [facility] INVASIVE CV LAB;  Service: Cardiovascular;;  lt. SFA, lt iliac stents    Prior to Admission medications   Medication Sig Start Date End Date Taking? Authorizing Provider  acetaminophen (TYLENOL) 325 MG tablet Take 2 tablets (650 mg total) by mouth every 6 (six) hours as needed for mild pain (or Fever >/= 101). 03/07/18  Yes Hongalgi, Maximino Greenland, MD  amLODIPine-Valsartan-HCTZ [phone] MG TABS Take 1 tablet by mouth daily.    Yes [provider]  clopidogrel (PLAVIX) 75 MG tablet Take 1 tablet (75 mg total) by mouth daily. Patient taking differently: Take 75 mg by mouth every evening.  02/23/18  Yes Tyrone Nine, MD  ferrous sulfate 325 (65 FE) MG tablet Take 1 tablet (325 mg total) by mouth 2 (two) times daily with a meal. 03/07/18  Yes Hongalgi, Maximino Greenland, MD  HYDROcodone-acetaminophen (NORCO/VICODIN) 5-325 MG tablet Take 1 tablet by mouth every 8 (eight) hours as needed for moderate pain or severe pain. 03/07/18  Yes Hongalgi, Maximino Greenland, MD  methocarbamol (ROBAXIN) 500 MG tablet Take 500 mg by mouth daily as needed for muscle spasms.    Yes [provider]  metoprolol succinate (TOPROL-XL) 25 MG 24 hr tablet Take 25 mg by mouth daily.  12/06/17  Yes [provider]  NUTRITIONAL SUPPLEMENT LIQD Take 120 mLs by mouth 2 (two) times daily. MedPass   Yes [provider]  polyethylene glycol (MIRALAX / GLYCOLAX) packet Take 17 g by mouth daily. 03/07/18  Yes Hongalgi, Maximino Greenland, MD  vitamin B-12 1000 MCG tablet Take 1 tablet (1,000 mcg total) by mouth daily. 03/08/18  Yes Hongalgi, Maximino Greenland, MD    Scheduled Meds: . sodium chloride   Intravenous Once  . clopidogrel  75 mg Oral Daily  . enoxaparin (LOVENOX) injection  40 mg Subcutaneous Q24H  . NUTRITIONAL SUPPLEMENT  120 mL Oral BID  .  polyethylene glycol  17 g Oral Daily  . cyanocobalamin  1,000 mcg Oral Daily   Continuous Infusions: . [START ON 03/31/2018] ceFEPime (MAXIPIME) IV    . lactated ringers    . pantoprozole (PROTONIX) infusion 8 mg/hr (03/30/18 1332)  . vancomycin     PRN Meds:.acetaminophen, HYDROcodone-acetaminophen, methocarbamol, ondansetron **OR** ondansetron (ZOFRAN) IV, oxyCODONE, polyethylene glycol  Allergies as of 03/30/2018  . (No Known Allergies)    No family history on file.  Social History   Socioeconomic History  . Marital status: Divorced    Spouse name: Not on file  . Number of children: Not on file  . Years of education: Not on file  . Highest education level: Not on file  Occupational History  . Not on file  Social Needs  . Financial resource strain: Not on file  . Food insecurity:    Worry: Not on file    Inability: Not on file  . Transportation needs:    Medical: Not on file    Non-medical: Not on file  Tobacco Use  . Smoking status: Former Smoker    Packs/day: 0.50    Years: 50.00    Pack years: 25.00    Types: Cigarettes  . Smokeless tobacco: Never Used  Substance and Sexual Activity  . Alcohol use: Never    Frequency: Never  . Drug use: Not Currently  . Sexual activity: Not Currently  Lifestyle  . Physical activity:    Days per week: Not on file    Minutes per session: Not on file  . Stress: Not on file  Relationships  . Social connections:    Talks on phone: Not on file    Gets together: Not on file    Attends religious service: Not on file    Active member of club or organization: Not on file    Attends meetings of clubs or organizations: Not on file    Relationship status: Not on file  . Intimate partner violence:    Fear of current or ex partner: Not on file    Emotionally abused: Not on file    Physically abused: Not on file    Forced sexual activity: Not on file  Other Topics Concern  . Not on file  Social History Narrative  . Not on file     Review of Systems: Not able to obtain  Physical Exam: Vital signs: Vitals:   03/30/18 1419 03/30/18 1430  BP: 97/62 112/64  Pulse:  98  Resp: (!) 27 (!) 25  Temp:  99.2 F (37.3 C)  SpO2:  (!) 56%     General:   Cachectic appearing patient resting the bed.  Only responds minimally to commands. HEENT : NS, AT, EOMI Lungs: Decreased respiratory efforts.  Anterior exam only Heart:  Regular rate and rhythm; no murmurs, clicks, rubs,  or gallops. Abdomen: Soft, nontender, nondistended, bowel sounds present.  No signs LE : Left BKA.  Ischemic/ gangrenous changes at right lower extremity  GI:  Lab Results:  Recent Labs    03/30/18 1206  WBC 26.0*  HGB 8.3*  HCT 26.5*  PLT 482*   BMET Recent Labs    03/30/18 1206  NA 136  K 3.6  CL 105  CO2 17*  GLUCOSE 200*  BUN 80*  CREATININE 2.42*  CALCIUM 8.9   LFT Recent Labs    03/30/18 1206  PROT 6.9  ALBUMIN 2.0*  AST 129*  ALT 124*  ALKPHOS 246*  BILITOT 0.5   PT/INR Recent Labs    03/30/18 1220  LABPROT 17.5*  INR 1.46     Studies/Results: Dg Chest Port 1 View  Result Date: 03/30/2018 CLINICAL DATA:  Hypotension. Possible sepsis. EXAM: PORTABLE CHEST 1 VIEW COMPARISON:  02/17/2018. FINDINGS: Normal sized heart. Mildly tortuous and calcified thoracic aorta. Clear lungs with normal vascularity. Bilateral AC joint degenerative changes. IMPRESSION: No acute abnormality. Electronically Signed   By: Beckie SaltsSteven  Reid M.D.   On: 03/30/2018 11:57    Impression/Plan: -Acute on chronic anemia with occult blood positive stool but no evidence of overt bleeding. -Critical limb ischemia.  Currently on Plavix.  Was on Eliquis and heparin drip during previous hospitalizations without any evidence of active bleeding. -Normal LFTs.  Also present since October 2019 but more elevated this time probably from underlying sepsis.  Ultrasound October 2019 showed contracted gallbladder and small polyp versus small stone at the  gallbladder fundus. -Sepsis  Recommendations ------------------------- -There is a mild drop in hemoglobin but there is no overt bleeding.Was on Eliquis and heparin drip during previous hospitalizations without any evidence of active bleeding. -I think it is okay from GI standpoint to use low-dose heparin drip if needed for ischemia of lower ext -Consider HIDA scan if LFTs trending upwards. -Monitor hemoglobin and LFTs.  GI will follow.      LOS: 0 days   Kathi DerParag Tachina Spoonemore  MD, FACP 03/30/2018, 3:35 PM  Contact #  [phone]

## 2018-03-30 NOTE — Progress Notes (Signed)
Called rapid response 1834 due to change in patient VS temp 100.3, 116/104 BP, MAP 110, RR 27 and patient trembling and tachypenic. Pt. stated that he was not cold nor in pain. MD Kamenini notified.   Rapid response nurse assessed patient, indicated patient likely experiencing rigors and provided support. Patient's glucose checked; CBG 63. 1/2 amp D50 given.   MD Kamineni informed nurse of patient's prognosis, awareness of rigors, her contact with granddaughter about patient's prognosis and new orders. Maintenance fluids changed to D5 NS at 100 ml/hr, repeat CBG at 1740, not to give second unit of blood (first unit already infused) and to give bolus of 500 mL LR.   Kamineni requested APP to visit patient during shift; APP paged. Informed nurse Dr. Allena KatzPatel would be the evening physician and provided her cellphone number to contact if needed.

## 2018-03-30 NOTE — Consult Note (Addendum)
Hospital Consult    Reason for Consult:  Gangrene of right foot Requesting Physician:  ER MRN #:  [id number]  History of Present Illness: This is a 67 y.o. male who was admitted last month with fevers.  He had an ulceration on the left foot.  He underwent aortogram and was found to have the following findings: Findings: The aorta is patent.  Right iliac arteries common and external appear patent.  On the left side there is a 80% stenosis of the external iliac artery which is resolved to 0% after stenting.  Left SFA is occluded after short takeoff reconstitutes above-knee popliteal.  After stenting there is no further residual stenosis or dissection he has runoff via the posterior tibial.  On the right side he has runoff to the knee via the SFA which is moderately diseased and then his tibial vessels appear to occlude.  He would need concentrated injections from the popliteal artery below the knee to identify the tibial artery occlusions if he were to need intervention there.  After speaking with the granddaughter, Portia, a consent was obtained and the pt subsequently underwent a left BKA on 03/01/18.  This has healed well and he had an appointment today to remove the staples.    He presented to the ED today with fever, sepsis and GIB and is currently receiving PRBC's for hgb of 8.3.  He is tachycardic.  His lactic acid is elevated.  He has been started on IV abx as well.    Vascular surgery is consulted for gangrene of the right foot.  When we were consulted in October, there were no wounds present on the right foot.  The pt is unable to give information about his foot.  He is unsure of the current year but does follow commands.    He is on a beta blocker, CCB, ARB for blood pressure management.  He is on Plavix.     Past Medical History:  Diagnosis Date  . Blood clot in vein 02/2018  . Dementia (HCC)   . Hypertension   . PVD (peripheral vascular disease) (HCC)     Past Surgical  History:  Procedure Laterality Date  . ABDOMINAL AORTOGRAM W/LOWER EXTREMITY N/A 02/20/2018   Procedure: ABDOMINAL AORTOGRAM W/LOWER EXTREMITY;  Surgeon: Cain, Brandon Christopher, MD;  Location: [facility];  Service: Cardiovascular;  Laterality: N/A;  bilateral  . AMPUTATION Left 03/02/2018   Procedure: AMPUTATION BELOW KNEE;  Surgeon: Clark, Christopher J, MD;  Location: [facility] OR;  Service: Vascular;  Laterality: Left;  . OTHER SURGICAL HISTORY     "I was small when I had a surgery on my legs, arms and across my stomach" (02/20/2018)  . PERIPHERAL VASCULAR INTERVENTION Left 02/20/2018   lt. SFA, lt iliac stents  . PERIPHERAL VASCULAR INTERVENTION  02/20/2018   Procedure: PERIPHERAL VASCULAR INTERVENTION;  Surgeon: Cain, Brandon Christopher, MD;  Location: [facility];  Service: Cardiovascular;;  lt. SFA, lt iliac stents    No Known Allergies  Prior to Admission medications   Medication Sig Start Date End Date Taking? Authorizing Provider  acetaminophen (TYLENOL) 325 MG tablet Take 2 tablets (650 mg total) by mouth every 6 (six) hours as needed for mild pain (or Fever >/= 101). 03/07/18  Yes Hongalgi, Anand D, MD  amLODIPine-Valsartan-HCTZ 10-320-25 MG TABS Take 1 tablet by mouth daily.    Yes [provider]  clopidogrel (PLAVIX) 75 MG tablet Take 1 tablet (75 mg total) by mouth daily. Patient taking   differently: Take 75 mg by mouth every evening.  02/23/18  Yes Grunz, Ryan B, MD  ferrous sulfate 325 (65 FE) MG tablet Take 1 tablet (325 mg total) by mouth 2 (two) times daily with a meal. 03/07/18  Yes Hongalgi, Anand D, MD  HYDROcodone-acetaminophen (NORCO/VICODIN) 5-325 MG tablet Take 1 tablet by mouth every 8 (eight) hours as needed for moderate pain or severe pain. 03/07/18  Yes Hongalgi, Anand D, MD  methocarbamol (ROBAXIN) 500 MG tablet Take 500 mg by mouth daily as needed for muscle spasms.    Yes [provider]  metoprolol succinate (TOPROL-XL) 25 MG  24 hr tablet Take 25 mg by mouth daily.  12/06/17  Yes [provider]  NUTRITIONAL SUPPLEMENT LIQD Take 120 mLs by mouth 2 (two) times daily. [employer]   Yes [provider]  polyethylene glycol (MIRALAX / GLYCOLAX) packet Take 17 g by mouth daily. 03/07/18  Yes Hongalgi, Anand D, MD  vitamin B-12 1000 MCG tablet Take 1 tablet (1,000 mcg total) by mouth daily. 03/08/18  Yes Hongalgi, Anand D, MD    Social History   Socioeconomic History  . Marital status: Divorced    Spouse name: Not on file  . Number of children: Not on file  . Years of education: Not on file  . Highest education level: Not on file  Occupational History  . Not on file  Social Needs  . Financial resource strain: Not on file  . Food insecurity:    Worry: Not on file    Inability: Not on file  . Transportation needs:    Medical: Not on file    Non-medical: Not on file  Tobacco Use  . Smoking status: Former Smoker    Packs/day: 0.50    Years: 50.00    Pack years: 25.00    Types: Cigarettes  . Smokeless tobacco: Never Used  Substance and Sexual Activity  . Alcohol use: Never    Frequency: Never  . Drug use: Not Currently  . Sexual activity: Not Currently  Lifestyle  . Physical activity:    Days per week: Not on file    Minutes per session: Not on file  . Stress: Not on file  Relationships  . Social connections:    Talks on phone: Not on file    Gets together: Not on file    Attends religious service: Not on file    Active member of club or organization: Not on file    Attends meetings of clubs or organizations: Not on file    Relationship status: Not on file  . Intimate partner violence:    Fear of current or ex partner: Not on file    Emotionally abused: Not on file    Physically abused: Not on file    Forced sexual activity: Not on file  Other Topics Concern  . Not on file  Social History Narrative  . Not on file    Family Hx:  Unable to obtain due to dementia  ROS: [x]  Positive   [ ] Negative   [ ] All sytems reviewed and are negative Pt unable to give ROS  Cardiac: [] chest pain/pressure [] palpitations [] SOB lying flat [] DOE  Vascular: [] pain in legs while walking [] pain in legs at rest [] pain in legs at night [x] non-healing ulcers-right foot [] hx of DVT [] swelling in legs  Pulmonary: [] productive cough [] asthma/wheezing [] home O2  Neurologic: [] weakness in []   arms [] legs [] numbness in [] arms [] legs [] hx of CVA [] mini stroke []difficulty speaking or slurred speech [] temporary loss of vision in one eye [] dizziness  Hematologic: [] hx of cancer [] bleeding problems [] problems with blood clotting easily  Endocrine:   [] diabetes [] thyroid disease  GI [] vomiting blood [] blood in stool  GU: [] CKD/renal failure [] HD--[] M/W/F or [] T/T/S [] burning with urination [] blood in urine  Psychiatric: [] anxiety [] depression  Musculoskeletal: [] arthritis [] joint pain  Integumentary: [] rashes [] ulcers  Constitutional: [x] fever on admission [] chills   Physical Examination  Vitals:   03/30/18 1409 03/30/18 1419  BP: 97/62 97/62  Pulse: (!) 110   Resp: (!) 22 (!) 27  Temp: 99.4 F (37.4 C)   SpO2:     Body mass index is 15.75 kg/m.  General:  Cachectic male in NAD Gait: Not observed HENT: WNL, normocephalic Pulmonary: normal non-labored breathing Cardiac: regular/tachycardic Abdomen:  soft, NT/ND, no masses Skin: without rashes Vascular Exam/Pulses:  Right Left  Radial 2+ (normal) 2+ (normal)  Femoral 2+ (normal) 2+ (normal)  Popliteal Unable to palpate  Unable to palpate   DP Nurse reports unable to doppler signals (doppler being used in CPR code currently) BKA  PT See above BKA   Extremities: well healed left BKA incision. Right foot: mild foul odor.     Musculoskeletal: no muscle wasting or atrophy  Neurologic: A&O X 3;  No focal weakness or paresthesias are  detected; speech is fluent/normal   CBC    Component Value Date/Time   WBC 26.0 (H) 03/30/2018 1206   RBC 3.14 (L) 03/30/2018 1206   HGB 8.3 (L) 03/30/2018 1206   HCT 26.5 (L) 03/30/2018 1206   PLT 482 (H) 03/30/2018 1206   MCV 84.4 03/30/2018 1206   MCH 26.4 03/30/2018 1206   MCHC 31.3 03/30/2018 1206   RDW 17.0 (H) 03/30/2018 1206   LYMPHSABS 1.0 03/30/2018 1206   MONOABS 1.0 03/30/2018 1206   EOSABS 0.0 03/30/2018 1206   BASOSABS 0.1 03/30/2018 1206    BMET    Component Value Date/Time   NA 136 03/30/2018 1206   K 3.6 03/30/2018 1206   CL 105 03/30/2018 1206   CO2 17 (L) 03/30/2018 1206   GLUCOSE 200 (H) 03/30/2018 1206   BUN 80 (H) 03/30/2018 1206   CREATININE 2.42 (H) 03/30/2018 1206   CALCIUM 8.9 03/30/2018 1206   GFRNONAA 26 (L) 03/30/2018 1206   GFRAA 30 (L) 03/30/2018 1206    COAGS: Lab Results  Component Value Date   INR 1.46 03/30/2018   INR 1.27 02/20/2018   INR 1.33 02/17/2018     Non-Invasive Vascular Imaging:   None currently  Statin:  No. Beta Blocker:  Yes.   Aspirin:  No. ACEI:  No. ARB:  Yes.   CCB use:  Yes Other antiplatelets/anticoagulants:  Yes.   Plavix   ASSESSMENT/PLAN: This is a 67 y.o. male who recently underwent left below knee amputation who presents to ED today with fever, gangrene of right foot and GIB   -pt febrile with leukocytosis of 26k and elevated lactic acid on admission.  He has been started on IV abx and is currently receiving PRBC's for GIB. -unable to get hx about when these wounds started on his right foot, but according to consult note last month, there were no wounds present on the right foot.   He   did have an aortogram last month by Dr. Cain and on the RLE, the SFA was moderately diseased and his tibial vessels appear to occlude. Per his findings,  he would need concentrated injections from the popliteal artery below the knee to identify the tibial artery occlusions if intervention warranted.  Would continue  IV abx and if pt is stable, Dr. Cain could do aortogram with RLE runoff early next week.  Pt is at high risk for RLE amputation.   -left BKA has healed nicely.  Will order for staples to be removed.   Samantha Rhyne, PA-C [facility] [phone]  I have seen and evaluated the patient.  He has new significant wounds on his right foot.  His white count is elevated to 26.  He is having fevers and chills.  He is recently status post left below-knee amputation.  I am concerned that the source of his infection is coming from his right foot.  I discussed with the patient that he will most likely require a right below-knee amputation.  I will see how he does overnight with IV antibiotics.  I am tentatively placing him on the operating room schedule for tomorrow.  He will be n.p.o. after midnight.  I will try to contact his family tonight. I tried to call his grandaughter but left a message.  Wells Gildardo Tickner 

## 2018-03-30 NOTE — Assessment & Plan Note (Signed)
See 03/29/2018 can provide no meaningful history; almost non verbal

## 2018-03-31 ENCOUNTER — Encounter (HOSPITAL_COMMUNITY): Payer: Self-pay | Admitting: Certified Registered Nurse Anesthetist

## 2018-03-31 ENCOUNTER — Encounter (HOSPITAL_COMMUNITY)
Admission: EM | Disposition: A | Discharge status: Skilled Nursing Facility | Payer: Self-pay | Source: Skilled Nursing Facility | Attending: Family Medicine

## 2018-03-31 ENCOUNTER — Other Ambulatory Visit (HOSPITAL_COMMUNITY): Payer: Medicare Other

## 2018-03-31 DIAGNOSIS — Z87891 Personal history of nicotine dependence: Secondary | ICD-10-CM

## 2018-03-31 DIAGNOSIS — I96 Gangrene, not elsewhere classified: Secondary | ICD-10-CM

## 2018-03-31 DIAGNOSIS — B9561 Methicillin susceptible Staphylococcus aureus infection as the cause of diseases classified elsewhere: Secondary | ICD-10-CM

## 2018-03-31 DIAGNOSIS — K0889 Other specified disorders of teeth and supporting structures: Secondary | ICD-10-CM

## 2018-03-31 DIAGNOSIS — N179 Acute kidney failure, unspecified: Secondary | ICD-10-CM

## 2018-03-31 DIAGNOSIS — K922 Gastrointestinal hemorrhage, unspecified: Secondary | ICD-10-CM

## 2018-03-31 DIAGNOSIS — Z89512 Acquired absence of left leg below knee: Secondary | ICD-10-CM

## 2018-03-31 DIAGNOSIS — R7881 Bacteremia: Secondary | ICD-10-CM

## 2018-03-31 LAB — CBC
HEMATOCRIT: 27 % — AB (ref 39.0–52.0)
HEMOGLOBIN: 8.8 g/dL — AB (ref 13.0–17.0)
MCH: 27 pg (ref 26.0–34.0)
MCHC: 32.6 g/dL (ref 30.0–36.0)
MCV: 82.8 fL (ref 80.0–100.0)
Platelets: 446 10*3/uL — ABNORMAL HIGH (ref 150–400)
RBC: 3.26 MIL/uL — ABNORMAL LOW (ref 4.22–5.81)
RDW: 15.9 % — ABNORMAL HIGH (ref 11.5–15.5)
WBC: 22.6 10*3/uL — ABNORMAL HIGH (ref 4.0–10.5)
nRBC: 0 % (ref 0.0–0.2)

## 2018-03-31 LAB — BLOOD CULTURE ID PANEL (REFLEXED)
ACINETOBACTER BAUMANNII: NOT DETECTED
CANDIDA ALBICANS: NOT DETECTED
CANDIDA GLABRATA: NOT DETECTED
CANDIDA KRUSEI: NOT DETECTED
CANDIDA PARAPSILOSIS: NOT DETECTED
Candida tropicalis: NOT DETECTED
ENTEROBACTER CLOACAE COMPLEX: NOT DETECTED
ESCHERICHIA COLI: NOT DETECTED
Enterobacteriaceae species: NOT DETECTED
Enterococcus species: NOT DETECTED
Haemophilus influenzae: NOT DETECTED
KLEBSIELLA OXYTOCA: NOT DETECTED
Klebsiella pneumoniae: NOT DETECTED
Listeria monocytogenes: NOT DETECTED
Methicillin resistance: NOT DETECTED
Neisseria meningitidis: NOT DETECTED
PSEUDOMONAS AERUGINOSA: NOT DETECTED
Proteus species: NOT DETECTED
SERRATIA MARCESCENS: NOT DETECTED
STREPTOCOCCUS PYOGENES: NOT DETECTED
Staphylococcus aureus (BCID): DETECTED — AB
Staphylococcus species: DETECTED — AB
Streptococcus agalactiae: NOT DETECTED
Streptococcus pneumoniae: NOT DETECTED
Streptococcus species: NOT DETECTED

## 2018-03-31 LAB — HEPARIN LEVEL (UNFRACTIONATED)
Heparin Unfractionated: <0.1 IU/mL — ABNORMAL LOW (ref 0.30–0.70)
Heparin Unfractionated: <0.1 IU/mL — ABNORMAL LOW (ref 0.30–0.70)

## 2018-03-31 LAB — BASIC METABOLIC PANEL
Anion gap: 11 (ref 5–15)
Anion gap: 11 (ref 5–15)
BUN: 46 mg/dL — AB (ref 8–23)
BUN: 23 mg/dL (ref 8–23)
CHLORIDE: 108 mmol/L (ref 98–111)
CO2: 19 mmol/L — ABNORMAL LOW (ref 22–32)
CO2: 19 mmol/L — ABNORMAL LOW (ref 22–32)
CREATININE: 1.37 mg/dL — AB (ref 0.61–1.24)
CREATININE: 1.16 mg/dL (ref 0.61–1.24)
Calcium: 8.4 mg/dL — ABNORMAL LOW (ref 8.9–10.3)
Calcium: 8.2 mg/dL — ABNORMAL LOW (ref 8.9–10.3)
Chloride: 111 mmol/L (ref 98–111)
GFR calc Af Amer: >60 mL/min (ref >60)
GFR calc Af Amer: >60 mL/min (ref >60)
GFR calc non Af Amer: 52 mL/min — ABNORMAL LOW (ref >60)
GLUCOSE: 167 mg/dL — AB (ref 70–99)
GLUCOSE: 113 mg/dL — AB (ref 70–99)
POTASSIUM: 2.5 mmol/L — AB (ref 3.5–5.1)
POTASSIUM: 2.7 mmol/L — AB (ref 3.5–5.1)
SODIUM: 141 mmol/L (ref 135–145)
Sodium: 138 mmol/L (ref 135–145)

## 2018-03-31 LAB — MAGNESIUM: MAGNESIUM: 2.2 mg/dL (ref 1.7–2.4)

## 2018-03-31 LAB — LACTIC ACID, PLASMA: LACTIC ACID, VENOUS: 1.5 mmol/L (ref 0.5–1.9)

## 2018-03-31 SURGERY — AMPUTATION BELOW KNEE
Anesthesia: General | Laterality: Right

## 2018-03-31 MED ORDER — POTASSIUM CHLORIDE CRYS ER 20 MEQ PO TBCR
40.0000 meq | EXTENDED_RELEASE_TABLET | Freq: Once | ORAL | Status: DC
  Filled 2018-03-31: qty 2

## 2018-03-31 MED ORDER — CEFAZOLIN SODIUM-DEXTROSE 1-4 GM/50ML-% IV SOLN
1.0000 g | Freq: Three times a day (TID) | INTRAVENOUS | Status: DC
  Administered 2018-03-31 – 2018-04-02 (×6): 1 g via INTRAVENOUS
  Filled 2018-03-31 (×9): qty 50

## 2018-03-31 MED ORDER — LORAZEPAM 2 MG/ML IJ SOLN: 1.0000 mg | Freq: Four times a day (QID) | INTRAMUSCULAR | Status: DC | PRN

## 2018-03-31 MED ORDER — POTASSIUM CHLORIDE 10 MEQ/100ML IV SOLN
10.0000 meq | INTRAVENOUS | Status: AC
  Administered 2018-03-31 – 2018-04-01 (×6): 10 meq via INTRAVENOUS
  Filled 2018-03-31 (×6): qty 100

## 2018-03-31 MED ORDER — MORPHINE SULFATE (PF) 2 MG/ML IV SOLN: 2.0000 mg | INTRAVENOUS | Status: AC | PRN

## 2018-03-31 MED ORDER — POTASSIUM CHLORIDE 10 MEQ/100ML IV SOLN
10.0000 meq | INTRAVENOUS | Status: AC
  Administered 2018-03-31 (×6): 10 meq via INTRAVENOUS
  Filled 2018-03-31 (×5): qty 100

## 2018-03-31 MED ORDER — HEPARIN (PORCINE) 25000 UT/250ML-% IV SOLN
1500.0000 [IU]/h | INTRAVENOUS | Status: DC
  Administered 2018-03-31: 1300 [IU]/h via INTRAVENOUS
  Filled 2018-03-31 (×2): qty 250

## 2018-03-31 MED ORDER — KCL IN DEXTROSE-NACL 40-5-0.45 MEQ/L-%-% IV SOLN
INTRAVENOUS | Status: DC
  Administered 2018-03-31 – 2018-04-03 (×6): via INTRAVENOUS
  Filled 2018-03-31 (×7): qty 1000

## 2018-03-31 NOTE — Progress Notes (Signed)
ANTICOAGULATION CONSULT NOTE   Pharmacy Consult for Heparin Indication: Ischemic Leg  No Known Allergies  Patient Measurements: Height: 5\' 10"  (177.8 cm) Weight: 109 lb 12.6 oz (49.8 kg) IBW/kg (Calculated) : 73 Heparin Dosing Weight: 49.8 kg  Vital Signs: Temp: 98.6 F (37 C) (11/23 0010) Temp Source: Oral (11/23 0010) BP: 101/61 (11/23 0010) Pulse Rate: 111 (11/23 0010)  Labs: Recent Labs    03/30/18 1206 03/30/18 1220 03/30/18 2105 03/31/18 0210  HGB 8.3*  --  8.8* 8.8*  HCT 26.5*  --  25.6* 27.0*  PLT 482*  --   --  446*  LABPROT  --  17.5*  --   --   INR  --  1.46  --   --   HEPARINUNFRC  --   --   --  <0.10*  CREATININE 2.42*  --   --  1.37*    Estimated Creatinine Clearance: 37.4 mL/min (A) (by C-G formula based on SCr of 1.37 mg/dL (H)).   Medical History: Past Medical History:  Diagnosis Date  . Blood clot in vein 02/2018  . Dementia (HCC)   . Hypertension   . PVD (peripheral vascular disease) (HCC)      Assessment: Patient [age] yoM presenting with gangrenous toes in right foot. Pharmacy has been consulted for heparin dosing. Patient was previously on apixaban, but this was stopped during October 2019 admission and patient remains on clopidogrel.   Per GI, will initiate heparin at low dose given history of chronic anemia and occult blood positive stool. Currently, HgB 8.3 and PLT 482.   During last admission, patient received heparin gtt as high as 1500-1700 units/hr. Due to recent BKA and concern for GIB, will start at much lower rate and titrate faster if needed.   11/23 AM update: heparin level undetectable, no issues per RN.   Goal of Therapy:  Heparin level 0.3-0.5 units/mL Monitor platelets by anticoagulation protocol: Yes   Plan:  Increase heparin drip to 1100 units/hr Re-check heparin level in 6-8 hours Monitor s/sx of bleed  Abran DukeJames Skai Lickteig, PharmD, BCPS Clinical Pharmacist Phone: [phone]

## 2018-03-31 NOTE — Progress Notes (Signed)
Pts granddaughter Sonia Sideorcha in this am and family has decided not to do surgery and to pursue palliative care. Dr. Mariea ClontsEmokpae aware as well as surgery. Pts granddaughter awaits palliative for further discussions. Melodye PedAngela Daviel Allegretto,RN

## 2018-03-31 NOTE — Progress Notes (Signed)
Palliative Medicine consult noted. Due to high referral volume, there may be a delay seeing this patient. Please call the Palliative Medicine Team office at [phone] if recommendations are needed in the interim.  Thank you for inviting us to see this patient.  No charge note 

## 2018-03-31 NOTE — Progress Notes (Signed)
ANTICOAGULATION CONSULT NOTE   Pharmacy Consult for Heparin Indication: Ischemic Leg  No Known Allergies  Patient Measurements: Height: 5\' 10"  (177.8 cm) Weight: 109 lb 12.6 oz (49.8 kg) IBW/kg (Calculated) : 73 Heparin Dosing Weight: 49.8 kg  Vital Signs: Temp: 100.1 F (37.8 C) (11/23 1209) Temp Source: Oral (11/23 1209) BP: 104/62 (11/23 0811) Pulse Rate: 123 (11/23 1209)  Labs: Recent Labs    03/30/18 1206 03/30/18 1220 03/30/18 2105 03/31/18 0210 03/31/18 1215  HGB 8.3*  --  8.8* 8.8*  --   HCT 26.5*  --  25.6* 27.0*  --   PLT 482*  --   --  446*  --   LABPROT  --  17.5*  --   --   --   INR  --  1.46  --   --   --   HEPARINUNFRC  --   --   --  <0.10* <0.10*  CREATININE 2.42*  --   --  1.37*  --     Estimated Creatinine Clearance: 37.4 mL/min (A) (by C-G formula based on SCr of 1.37 mg/dL (H)).   Medical History: Past Medical History:  Diagnosis Date  . Blood clot in vein 02/2018  . Dementia (HCC)   . Hypertension   . PVD (peripheral vascular disease) (HCC)      Assessment: [name] presenting with gangrenous toes in right foot. Pharmacy has been consulted for heparin dosing. Patient was previously on apixaban, but this was stopped during October 2019 admission and patient remains on clopidogrel.   Per GI, will initiate heparin at low dose given history of chronic anemia and occult blood positive stool. Currently, HgB 8.3 and PLT 482.   During last admission, patient received heparin gtt as high as 1500-1700 units/hr. Due to recent BKA and concern for GIB, will start at much lower rate and titrate faster if needed.   11/23  heparin level remains undetectable after heparin rate increased to 1100 units/hr. No issues with IV line/site looks good  per RN.   RN will change heparin to new IV site just in case there is an complication with the infusion IV site.  No bleeding noted.   Goal of Therapy:  Heparin level 0.3-0.5 units/mL Monitor platelets by  anticoagulation protocol: Yes   Plan:  Increase heparin drip to 1300 units/hr Re-check heparin level in 6 hours Monitor s/sx of bleed   Malik Stanton, RPh Clinical Pharmacist Please check AMION for all [facility] phone numbers After 10:00 PM, call Main Pharmacy [phone] 03/31/2018  2:28 PM

## 2018-03-31 NOTE — Progress Notes (Signed)
Patient is lying in bed, evidently comfortable.  He denies abdominal pain, also denies pain anywhere.  He is oriented to name.    Liver chemistries were not repeated today (are scheduled for tomorrow), but hemoglobin is stable.  No suggestions at present.  Florencia Reasonsobert V. Pierre Dellarocco, M.D. Pager [phone] If no answer or after 5 PM call [phone]

## 2018-03-31 NOTE — Progress Notes (Signed)
Patient Demographics:    Malik Stanton, is a 67 y.o. male, DOB - 21-Nov-1950, AVW:[id number]  Admit date - 03/30/2018   Admitting Physician Alessandra BevelsNeelima Kamineni, MD  Outpatient Primary MD for the patient is [facility]  LOS - 1   Chief Complaint  Patient presents with  . Blood Infection        Subjective:    Malik Stanton today has no fevers,    No chest pain, no vomiting no diarrhea  Assessment  & Plan :    Active Problems:   Sepsis [facility](HCC)  Brief Summary  67 y.o. 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 Myra GianottiBrabham appreciated, at this time family is opting for palliative/nonsurgical treatment.  Hold off on TTE, okay to give iv Ancef for now pending official palliative care conference to address goals of care and possibly de-escalate treatment, WBC down to 22K from 26K  2)Chronic Anemia--- Hgb is stable at 8.8 which is close to his recent baseline, on admission was concerned about dark stools, no further bleeding noted  3)Possible Gi Bleed--- H&H is stable as noted above #2, continue IV Protonix  4)PAD--- Plavix on hold, right foot gangrene noted, currently on IV heparin, watch for bleeding  5)Hypokalemia--potassium down to 2.5, magnesium is normal at 2.2, replace and recheck  6)AKI----acute kidney injury, improving with hydration     creatinine on admission= 2.42 ,    baseline creatinine =0.7    , creatinine is now=1.37, renally adjust medications, avoid nephrotoxic agents / dehydration/hypotension  7)Social/Ethics--plan of care discussed with patient granddaughter Ms Malvin Johnsorcha Thong  [phone]) and patient's son [name], [name]...[name].[name].[name]. granddaughter is primary contact   Disposition/Need for in-Hospital Stay- patient unable to be discharged at this time due to sepsis sepsis requiring IV Ancef antibiotic pending further decision by family on goals of care, also needs IV heparin for PAD and right foot gangrene  Code Status : DNR   Disposition Plan  : TBD--- SNF with palliative care versus hospice house  Consults  : Palliative care/vascular surgery/infectious disease   DVT Prophylaxis  : IV heparin, avoid SCDs due to PAD with ischemic limb and status post BKA on the left  Lab Results  Component Value Date   PLT 446 (H) 03/31/2018    Inpatient Medications  Scheduled Meds: . sodium chloride   Intravenous Once  . aspirin EC  81 mg Oral Daily  . feeding supplement (ENSURE ENLIVE)  237 mL Oral BID BM  . polyethylene glycol  17 g Oral Daily  . potassium chloride  40 mEq Oral Once  . cyanocobalamin  1,000 mcg Oral Daily   Continuous Infusions: . sodium chloride Stopped (03/31/18 0653)  .  ceFAZolin (ANCEF) IV    . dextrose 5 % and 0.9% NaCl 125  mL/hr at 03/31/18 1232  . heparin 1,300 Units/hr (03/31/18 1439)  . lactated ringers Stopped (03/30/18 1904)  . pantoprozole (PROTONIX) infusion 8 mg/hr (03/31/18 1232)   PRN Meds:.sodium chloride, acetaminophen, acetaminophen, HYDROcodone-acetaminophen, methocarbamol, ondansetron **OR** ondansetron (ZOFRAN) IV, oxyCODONE    Anti-infectives (From admission, onward)   Start     Dose/Rate Route Frequency Ordered Stop   03/31/18 1530  ceFAZolin (ANCEF) IVPB 1 g/50 mL premix     1 g 100 mL/hr over 30 Minutes Intravenous Every 8 hours 03/31/18 1443     03/31/18 1000  ceFEPIme (MAXIPIME) 1 g in sodium  chloride 0.9 % 100 mL IVPB  Status:  Discontinued     1 g 200 mL/hr over 30 Minutes Intravenous Every 24 hours 03/30/18 1525 03/31/18 1324   03/30/18 1600  vancomycin (VANCOCIN) IVPB 1000 mg/200 mL premix     1,000 mg 200 mL/hr over 60 Minutes Intravenous  Once 03/30/18 1525 03/30/18 2012   03/30/18 1145  cefTRIAXone (ROCEPHIN) 2 g in sodium chloride 0.9 % 100 mL IVPB  Status:  Discontinued     2 g 200 mL/hr over 30 Minutes Intravenous Every 24 hours 03/30/18 1136 03/30/18 1525        Objective:   Vitals:   03/31/18 0010 03/31/18 0346 03/31/18 0811 03/31/18 1209  BP: 101/61 114/69 104/62   Pulse: (!) 111 (!) 115  (!) 123  Resp: (!) 27 (!) 23 (!) 23   Temp: 98.6 F (37 C) 98.7 F (37.1 C)  100.1 F (37.8 C)  TempSrc: Oral Oral  Oral  SpO2: 100% 100% 100% 99%  Weight:      Height:        Wt Readings from Last 3 Encounters:  03/30/18 49.8 kg  03/29/18 49.9 kg  03/08/18 56.9 kg     Intake/Output Summary (Last 24 hours) at 03/31/2018 1614 Last data filed at 03/31/2018 1610 Gross per 24 hour  Intake 2261.25 ml  Output 1175 ml  Net 1086.25 ml     Physical Exam Patient is examined daily including today on 03/31/18 , exams remain the same as of yesterday except that has changed   Gen:- Awake , wakes up to answer questions but overall somewhat lethargic HEENT:- [place].AT, No sclera icterus Neck-Supple Neck,No JVD,.  Lungs-diminished in bases, no wheezing  CV- S1, S2 normal, regular  Abd-  +ve B.Sounds, Abd Soft, No tenderness,    Extremity/Skin:-Left BKA stump with intact staples, right foot gangrene noted Psych-affect is flat oriented to self Neuro-generalized weakness, did not ambulate , no tremors   Data Review:   Micro Results Recent Results (from the past 240 hour(s))  Blood Culture (routine x 2)     Status: None (Preliminary result)   Collection Time: 03/30/18 12:06 PM  Result Value Ref Range Status   Specimen Description BLOOD RIGHT ANTECUBITAL  Final    Special Requests   Final    BOTTLES DRAWN AEROBIC AND ANAEROBIC Blood Culture adequate volume   Culture  Setup Time   Final    GRAM POSITIVE COCCI IN BOTH AEROBIC AND ANAEROBIC BOTTLES CRITICAL VALUE NOTED.  VALUE IS CONSISTENT WITH PREVIOUSLY REPORTED AND CALLED VALUE.    Culture   Final    NO GROWTH 1 DAY Performed at [facility], [address] [place], [place] [address]    Report Status PENDING  Incomplete  Blood Culture (routine x 2)     Status: None (Preliminary result)   Collection Time: 03/30/18 12:15 PM  Result Value Ref Range Status   Specimen Description BLOOD BLOOD RIGHT ARM  Final   Special Requests   Final    BOTTLES DRAWN AEROBIC AND ANAEROBIC Blood Culture adequate volume   Culture  Setup Time   Final    GRAM POSITIVE COCCI IN BOTH AEROBIC AND ANAEROBIC BOTTLES Organism ID to follow CRITICAL RESULT CALLED TO, READ BACK BY AND VERIFIED WITHMelven Sartorius [facility] 1914 03/31/18 A BROWNING    Culture   Final    NO GROWTH 1 DAY Performed at [facility], [address] [place], [place] [address]    Report Status PENDING  Incomplete  Blood Culture ID Panel (Reflexed)     Status: Abnormal   Collection Time: 03/30/18 12:15 PM  Result Value Ref Range Status   Enterococcus species NOT DETECTED NOT DETECTED Final   Listeria monocytogenes NOT DETECTED NOT DETECTED Final   Staphylococcus species DETECTED (A) NOT DETECTED Final    Comment: CRITICAL RESULT CALLED TO, READ BACK BY AND VERIFIED WITHShela Commons [facility] PHARMD [id number] 03/31/18 A BROWNING    Staphylococcus aureus (BCID) DETECTED (A) NOT DETECTED Final    Comment: Methicillin (oxacillin) susceptible Staphylococcus aureus (MSSA). Preferred therapy is anti staphylococcal beta lactam antibiotic (Cefazolin or Nafcillin), unless clinically contraindicated. CRITICAL RESULT CALLED TO, READ BACK BY AND VERIFIED WITHMelven Sartorius [facility] 0865 03/31/18 A BROWNING    Methicillin resistance NOT DETECTED NOT DETECTED Final    Streptococcus species NOT DETECTED NOT DETECTED Final   Streptococcus agalactiae NOT DETECTED NOT DETECTED Final   Streptococcus pneumoniae NOT DETECTED NOT DETECTED Final   Streptococcus pyogenes NOT DETECTED NOT DETECTED Final   Acinetobacter baumannii NOT DETECTED NOT DETECTED Final   Enterobacteriaceae species NOT DETECTED NOT DETECTED Final   Enterobacter cloacae complex NOT DETECTED NOT DETECTED Final   Escherichia coli NOT DETECTED NOT DETECTED Final   Klebsiella oxytoca NOT DETECTED NOT DETECTED Final   Klebsiella pneumoniae NOT DETECTED NOT DETECTED Final   Proteus species NOT DETECTED NOT DETECTED Final   Serratia marcescens NOT DETECTED NOT DETECTED Final   Haemophilus influenzae NOT DETECTED NOT DETECTED Final   Neisseria meningitidis NOT DETECTED NOT DETECTED Final   Pseudomonas aeruginosa NOT DETECTED NOT DETECTED Final   Candida albicans NOT DETECTED NOT DETECTED Final   Candida glabrata NOT DETECTED NOT DETECTED Final   Candida krusei NOT DETECTED NOT DETECTED Final   Candida parapsilosis NOT DETECTED NOT DETECTED Final   Candida tropicalis NOT DETECTED NOT DETECTED Final    Comment: Performed at [facility], [address], [place], [place] [address]    Radiology Reports Dg Chest Port 1 View  Result Date: 03/30/2018 CLINICAL DATA:  Hypotension. Possible sepsis. EXAM: PORTABLE CHEST 1 VIEW COMPARISON:  02/17/2018. FINDINGS: Normal sized heart. Mildly tortuous and calcified thoracic aorta. Clear lungs with normal vascularity. Bilateral AC joint degenerative changes. IMPRESSION: No acute abnormality. Electronically Signed   By: Beckie Salts M.D.   On: 03/30/2018 11:57     CBC Recent Labs  Lab 03/30/18 1206 03/30/18 2105 03/31/18 0210  WBC 26.0*  --  22.6*  HGB 8.3* 8.8* 8.8*  HCT 26.5* 25.6* 27.0*  PLT 482*  --  446*  MCV 84.4  --  82.8  MCH 26.4  --  27.0  MCHC 31.3  --  32.6  RDW 17.0*  --  15.9*  LYMPHSABS 1.0  --   --   MONOABS 1.0  --   --     EOSABS  0.0  --   --   BASOSABS 0.1  --   --     Chemistries  Recent Labs  Lab 03/30/18 1206 03/31/18 0210  NA 136 138  K 3.6 2.5*  CL 105 108  CO2 17* 19*  GLUCOSE 200* 167*  BUN 80* 46*  CREATININE 2.42* 1.37*  CALCIUM 8.9 8.4*  MG  --  2.2  AST 129*  --   ALT 124*  --   ALKPHOS 246*  --   BILITOT 0.5  --    ------------------------------------------------------------------------------------------------------------------ No results for input(s): CHOL, HDL, LDLCALC, TRIG, CHOLHDL, LDLDIRECT in the last 72 hours.  No results found for: HGBA1C ------------------------------------------------------------------------------------------------------------------ No results for input(s): TSH, T4TOTAL, T3FREE, THYROIDAB in the last 72 hours.  Invalid input(s): FREET3 ------------------------------------------------------------------------------------------------------------------ No results for input(s): VITAMINB12, FOLATE, FERRITIN, TIBC, IRON, RETICCTPCT in the last 72 hours.  Coagulation profile Recent Labs  Lab 03/30/18 1220  INR 1.46    No results for input(s): DDIMER in the last 72 hours.  Cardiac Enzymes No results for input(s): CKMB, TROPONINI, MYOGLOBIN in the last 168 hours.  Invalid input(s): CK ------------------------------------------------------------------------------------------------------------------ No results found for: BNP   Shon Hale M.D on 03/31/2018 at 4:14 PM  Pager---[phone] Go to [email or web address] - password [email or web address] for contact info  [facility] - Office  [phone]

## 2018-03-31 NOTE — Consult Note (Addendum)
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 Change nabx to ancef 2. Repeat BCx 3. Await possible surgery 4. Check TTE 5. Would consider TEE with his bacteremia, but with known source and his mental status would hold for now.   Thank you so much for this interesting consult,  Active Problems:   Sepsis ([place])   . sodium chloride   Intravenous Once  . aspirin EC  81 mg Oral Daily  . feeding supplement (ENSURE ENLIVE)  237 mL Oral BID BM  . polyethylene glycol  17 g Oral Daily  . potassium chloride  40 mEq Oral Once  . cyanocobalamin  1,000 mcg Oral Daily    HPI: Malik Stanton is a 67 y.o. male with hx of PVD and L BKA 03-01-18. He returns to ED on 11-22 with fever and noted to have necrosis of toes of his R foot. He was found to have WBC of 26 and temp of  101.2 He was also found to be having a GI bleed.  He has been eval by vascular surgery. He is now found to have BCx+ staph aureus.   Review of Systems: Review of Systems  Unable to perform ROS: Mental acuity  Constitutional: Positive for fever.  Respiratory: Negative for shortness of breath.   Gastrointestinal: Negative for constipation and diarrhea.  Genitourinary: Negative for dysuria.    Past Medical History:  Diagnosis Date  . Blood clot in vein 02/2018  . Dementia ([place])   . Hypertension   . PVD (peripheral vascular disease) ([place])     Social History   Tobacco Use  . Smoking status: Former Smoker    Packs/day: 0.50    Years: 50.00    Pack years: 25.00    Types: Cigarettes  . Smokeless tobacco: Never Used  Substance Use Topics  . Alcohol use: Never    Frequency: Never  . Drug use: Not Currently    No family history on file.   Medications:    Scheduled: . sodium chloride   Intravenous Once  . aspirin EC  81 mg Oral Daily  . feeding supplement (ENSURE ENLIVE)  237 mL Oral BID BM  . polyethylene glycol  17 g Oral Daily  . potassium chloride  40 mEq Oral Once  . cyanocobalamin  1,000 mcg Oral Daily    Abtx:  Anti-infectives (From admission, onward)   Start     Dose/Rate Route Frequency Ordered Stop   03/31/18 1000  ceFEPIme (MAXIPIME) 1 g in sodium chloride 0.9 % 100 mL IVPB     1 g 200 mL/hr over 30 Minutes Intravenous Every 24 hours 03/30/18 1525     03/30/18 1600  vancomycin (VANCOCIN) IVPB 1000 mg/200 mL premix     1,000 mg 200 mL/hr over 60 Minutes Intravenous  Once 03/30/18 1525 03/30/18 2012   03/30/18 1145  cefTRIAXone (ROCEPHIN) 2 g in sodium chloride 0.9 % 100 mL IVPB  Status:  Discontinued     2 g 200 mL/hr over 30 Minutes Intravenous Every 24 hours 03/30/18 1136 03/30/18 1525        OBJECTIVE: Blood pressure 104/62, pulse (!) 123,  temperature 100.1 F (37.8 C), temperature source Oral, resp. rate (!) 23, height _0  (1.778 m), weight 49.8 kg, SpO2 99 %.  Physical Exam  Constitutional: No distress.  HENT:  Mouth/Throat: Oropharynx is clear and moist. Abnormal dentition. No oropharyngeal exudate.  Eyes: Pupils are equal, round, and reactive to light.  Neck: Neck supple.  Cardiovascular: Normal rate, regular rhythm and normal heart sounds.  Pulmonary/Chest: Effort normal and breath sounds normal.  Abdominal: Soft. Bowel sounds are normal. He exhibits no distension. There is no tenderness.  Musculoskeletal:       Legs:      Feet:  Lymphadenopathy:    He has no cervical adenopathy.  Neurological:  Slow to answer questions.   Skin: He is not diaphoretic.    Lab Results Results for orders placed or performed during the hospital encounter of 03/30/18 (from the past 48 hour(s))  POC occult blood, ED Provider will collect     Status: Abnormal   Collection Time: 03/30/18 11:58 AM  Result Value Ref  Range   Fecal Occult Bld POSITIVE (A) NEGATIVE  I-Stat CG4 Lactic Acid, ED     Status: Abnormal   Collection Time: 03/30/18 12:00 PM  Result Value Ref Range   Lactic Acid, Venous 4.77 (HH) 0.5 - 1.9 mmol/L   Comment NOTIFIED PHYSICIAN   Type and screen [place]     Status: None (Preliminary result)   Collection Time: 03/30/18 12:05 PM  Result Value Ref Range   ABO/RH(D) A POS    Antibody Screen NEG    Sample Expiration 04/02/2018    Unit Number [id number]    Blood Component Type RED CELLS,LR    Unit division 00    Status of Unit ALLOCATED    Transfusion Status OK TO TRANSFUSE    Crossmatch Result Compatible    Unit Number [id number]    Blood Component Type RED CELLS,LR    Unit division 00    Status of Unit ISSUED    Transfusion Status OK TO TRANSFUSE    Crossmatch Result      Compatible Performed at [facility], [address] [place], [place] [address]   Comprehensive metabolic panel     Status: Abnormal   Collection Time: 03/30/18 12:06 PM  Result Value Ref Range   Sodium 136 135 - 145 mmol/L   Potassium 3.6 3.5 - 5.1 mmol/L   Chloride 105 98 - 111 mmol/L   CO2 17 (L) 22 - 32 mmol/L   Glucose, Bld 200 (H) 70 - 99 mg/dL   BUN 80 (H) 8 - 23 mg/dL   Creatinine, Ser 2.42 (H) 0.61 - 1.24 mg/dL   Calcium 8.9 8.9 - 10.3 mg/dL   Total Protein 6.9 6.5 - 8.1 g/dL   Albumin 2.0 (L) 3.5 - 5.0 g/dL   AST 129 (H) 15 - 41 U/L   ALT 124 (H) 0 - 44 U/L   Alkaline Phosphatase 246 (H) 38 - 126 U/L   Total Bilirubin 0.5 0.3 - 1.2 mg/dL   GFR calc non Af Amer 26 (L) >60 mL/min   GFR calc Af Amer 30 (L) >60 mL/min    Comment: (NOTE) The eGFR has been calculated using the CKD EPI equation. This calculation has not been validated in all clinical situations. eGFR's persistently <60 mL/min signify possible Chronic Kidney Disease.    Anion gap 14 5 - 15    Comment: Performed at [facility],  [place] [address]  CBC WITH  DIFFERENTIAL     Status: Abnormal   Collection Time: 03/30/18 12:06 PM  Result Value Ref Range   WBC 26.0 (H) 4.0 - 10.5 K/uL    Comment: WHITE COUNT CONFIRMED ON SMEAR   RBC 3.14 (L) 4.22 - 5.81 MIL/uL   Hemoglobin 8.3 (L) 13.0 - 17.0 g/dL   HCT 26.5 (L) 39.0 - 52.0 %   MCV 84.4 80.0 - 100.0 fL   MCH 26.4 26.0 - 34.0 pg   MCHC 31.3 30.0 - 36.0 g/dL   RDW 17.0 (H) 11.5 - 15.5 %   Platelets 482 (H) 150 - 400 K/uL   nRBC 0.0 0.0 - 0.2 %   Neutrophils Relative % 89 %   Neutro Abs 23.1 (H) 1.7 - 7.7 K/uL   Lymphocytes Relative 4 %   Lymphs Abs 1.0 0.7 - 4.0 K/uL   Monocytes Relative 4 %   Monocytes Absolute 1.0 0.1 - 1.0 K/uL   Eosinophils Relative 0 %   Eosinophils Absolute 0.0 0.0 - 0.5 K/uL   Basophils Relative 0 %   Basophils Absolute 0.1 0.0 - 0.1 K/uL   WBC Morphology MILD LEFT SHIFT (1-5% METAS, OCC MYELO, OCC BANDS)    Immature Granulocytes 3 %   Abs Immature Granulocytes 0.79 (H) 0.00 - 0.07 K/uL   Burr Cells PRESENT     Comment: Performed at [facility], [place] [address], [place], [place] [address]  Blood Culture (routine x 2)     Status: None (Preliminary result)   Collection Time: 03/30/18 12:06 PM  Result Value Ref Range   Specimen Description BLOOD RIGHT ANTECUBITAL    Special Requests      BOTTLES DRAWN AEROBIC AND ANAEROBIC Blood Culture adequate volume   Culture  Setup Time      GRAM POSITIVE COCCI IN BOTH AEROBIC AND ANAEROBIC BOTTLES CRITICAL VALUE NOTED.  VALUE IS CONSISTENT WITH PREVIOUSLY REPORTED AND CALLED VALUE. Performed at [facility], [place] [address] [place], [place] [address]    Culture PENDING    Report Status PENDING   Blood Culture (routine x 2)     Status: None (Preliminary result)   Collection Time: 03/30/18 12:15 PM  Result Value Ref Range   Specimen Description BLOOD BLOOD RIGHT ARM    Special Requests      BOTTLES DRAWN AEROBIC AND ANAEROBIC Blood Culture adequate volume   Culture  Setup Time      GRAM POSITIVE COCCI IN  BOTH AEROBIC AND ANAEROBIC BOTTLES Organism ID to follow CRITICAL RESULT CALLED TO, READ BACK BY AND VERIFIED WITHKarsten Ro [facility] 2130 03/31/18 A BROWNING Performed at [facility], [place] [address], [place], [place] [address]    Culture PENDING    Report Status PENDING   Blood Culture ID Panel (Reflexed)     Status: Abnormal   Collection Time: 03/30/18 12:15 PM  Result Value Ref Range   Enterococcus species NOT DETECTED NOT DETECTED   Listeria monocytogenes NOT DETECTED NOT DETECTED   Staphylococcus species DETECTED (A) NOT DETECTED    Comment: CRITICAL RESULT CALLED TO, READ BACK BY AND VERIFIED WITHLenna Sciara [facility] PHARMD [id number] 03/31/18 A BROWNING    Staphylococcus aureus (BCID) DETECTED (A) NOT DETECTED    Comment: Methicillin (oxacillin) susceptible Staphylococcus aureus (MSSA). Preferred therapy is anti staphylococcal beta lactam antibiotic (Cefazolin or Nafcillin), unless clinically contraindicated. CRITICAL RESULT CALLED TO, READ BACK BY AND VERIFIED WITHLenna Sciara [facility] PHARMD 2952 03/31/18 A  BROWNING    Methicillin resistance NOT DETECTED NOT DETECTED   Streptococcus species NOT DETECTED NOT DETECTED   Streptococcus agalactiae NOT DETECTED NOT DETECTED   Streptococcus pneumoniae NOT DETECTED NOT DETECTED   Streptococcus pyogenes NOT DETECTED NOT DETECTED   Acinetobacter baumannii NOT DETECTED NOT DETECTED   Enterobacteriaceae species NOT DETECTED NOT DETECTED   Enterobacter cloacae complex NOT DETECTED NOT DETECTED   Escherichia coli NOT DETECTED NOT DETECTED   Klebsiella oxytoca NOT DETECTED NOT DETECTED   Klebsiella pneumoniae NOT DETECTED NOT DETECTED   Proteus species NOT DETECTED NOT DETECTED   Serratia marcescens NOT DETECTED NOT DETECTED   Haemophilus influenzae NOT DETECTED NOT DETECTED   Neisseria meningitidis NOT DETECTED NOT DETECTED   Pseudomonas aeruginosa NOT DETECTED NOT DETECTED   Candida albicans NOT DETECTED NOT DETECTED   Candida glabrata NOT DETECTED  NOT DETECTED   Candida krusei NOT DETECTED NOT DETECTED   Candida parapsilosis NOT DETECTED NOT DETECTED   Candida tropicalis NOT DETECTED NOT DETECTED    Comment: Performed at [facility] [address], [place], [place] [address]  Protime-INR     Status: Abnormal   Collection Time: 03/30/18 12:20 PM  Result Value Ref Range   Prothrombin Time 17.5 (H) 11.4 - 15.2 seconds   INR 1.46     Comment: Performed at [facility] [address] [place], [place] [address]  Prepare RBC     Status: None   Collection Time: 03/30/18 12:57 PM  Result Value Ref Range   Order Confirmation      ORDER PROCESSED BY BLOOD BANK Performed at [facility], [place] [address] [place], [place] [address]   I-Stat CG4 Lactic Acid, ED     Status: Abnormal   Collection Time: 03/30/18  2:28 PM  Result Value Ref Range   Lactic Acid, Venous 2.96 (HH) 0.5 - 1.9 mmol/L   Comment NOTIFIED PHYSICIAN   Glucose, capillary     Status: Abnormal   Collection Time: 03/30/18  7:01 PM  Result Value Ref Range   Glucose-Capillary 63 (L) 70 - 99 mg/dL  Glucose, capillary     Status: Abnormal   Collection Time: 03/30/18  7:43 PM  Result Value Ref Range   Glucose-Capillary 156 (H) 70 - 99 mg/dL  Hemoglobin and hematocrit, blood     Status: Abnormal   Collection Time: 03/30/18  9:05 PM  Result Value Ref Range   Hemoglobin 8.8 (L) 13.0 - 17.0 g/dL   HCT 25.6 (L) 39.0 - 52.0 %    Comment: Performed at [facility], [place] [address] [place], [place] [address]  Lactic acid, plasma     Status: None   Collection Time: 03/30/18  9:05 PM  Result Value Ref Range   Lactic Acid, Venous 1.7 0.5 - 1.9 mmol/L    Comment: Performed at [facility] [address], [place], [place] [address]  Lactic acid, plasma     Status: None   Collection Time: 03/31/18 12:36 AM  Result Value Ref Range   Lactic Acid, Venous 1.5 0.5 - 1.9 mmol/L    Comment: Performed at [facility] [address]  [place], [place] [address]  Basic metabolic panel     Status: Abnormal   Collection Time: 03/31/18  2:10 AM  Result Value Ref Range   Sodium 138 135 - 145 mmol/L   Potassium 2.5 (LL) 3.5 - 5.1 mmol/L    Comment: CRITICAL RESULT CALLED  TO, READ BACK BY AND VERIFIED WITH: OVERBY M,RN 03/31/18 0302 WAYK    Chloride 108 98 - 111 mmol/L   CO2 19 (L) 22 - 32 mmol/L   Glucose, Bld 167 (H) 70 - 99 mg/dL   BUN 46 (H) 8 - 23 mg/dL   Creatinine, Ser 1.37 (H) 0.61 - 1.24 mg/dL    Comment: DELTA CHECK NOTED   Calcium 8.4 (L) 8.9 - 10.3 mg/dL   GFR calc non Af Amer 52 (L) >60 mL/min   GFR calc Af Amer >60 >60 mL/min    Comment: (NOTE) The eGFR has been calculated using the CKD EPI equation. This calculation has not been validated in all clinical situations. eGFR's persistently <60 mL/min signify possible Chronic Kidney Disease.    Anion gap 11 5 - 15    Comment: Performed at [facility] [address] [place] [address], [place], [place] [address]  CBC     Status: Abnormal   Collection Time: 03/31/18  2:10 AM  Result Value Ref Range   WBC 22.6 (H) 4.0 - 10.5 K/uL   RBC 3.26 (L) 4.22 - 5.81 MIL/uL   Hemoglobin 8.8 (L) 13.0 - 17.0 g/dL   HCT 27.0 (L) 39.0 - 52.0 %   MCV 82.8 80.0 - 100.0 fL   MCH 27.0 26.0 - 34.0 pg   MCHC 32.6 30.0 - 36.0 g/dL   RDW 15.9 (H) 11.5 - 15.5 %   Platelets 446 (H) 150 - 400 K/uL   nRBC 0.0 0.0 - 0.2 %    Comment: Performed at [facility] [address] [place], [place] [address]  Heparin level (unfractionated)     Status: Abnormal   Collection Time: 03/31/18  2:10 AM  Result Value Ref Range   Heparin Unfractionated <0.10 (L) 0.30 - 0.70 IU/mL    Comment: (NOTE) If heparin results are below expected values, and patient dosage has  been confirmed, suggest follow up testing of antithrombin III levels. Performed at [facility], [place] [address] [place], [place] [address]   Magnesium     Status: None   Collection Time: 03/31/18  2:10 AM  Result Value Ref  Range   Magnesium 2.2 1.7 - 2.4 mg/dL    Comment: Performed at [facility] [address] [place], [place] [address]      Component Value Date/Time   SDES BLOOD BLOOD RIGHT ARM 03/30/2018 1215   [place]  03/30/2018 1215    BOTTLES DRAWN AEROBIC AND ANAEROBIC Blood Culture adequate volume   CULT PENDING 03/30/2018 1215   REPTSTATUS PENDING 03/30/2018 1215   Dg Chest Port 1 View  Result Date: 03/30/2018 CLINICAL DATA:  Hypotension. Possible sepsis. EXAM: PORTABLE CHEST 1 VIEW COMPARISON:  02/17/2018. FINDINGS: Normal sized heart. Mildly tortuous and calcified thoracic aorta. Clear lungs with normal vascularity. Bilateral AC joint degenerative changes. IMPRESSION: No acute abnormality. Electronically Signed   By: Claudie Revering M.D.   On: 03/30/2018 11:57   Recent Results (from the past 240 hour(s))  Blood Culture (routine x 2)     Status: None (Preliminary result)   Collection Time: 03/30/18 12:06 PM  Result Value Ref Range Status   Specimen Description BLOOD RIGHT ANTECUBITAL  Final   Special Requests   Final    BOTTLES DRAWN AEROBIC AND ANAEROBIC Blood Culture adequate volume   Culture  Setup Time   Final    GRAM POSITIVE COCCI IN BOTH AEROBIC AND ANAEROBIC BOTTLES CRITICAL VALUE NOTED.  VALUE  IS CONSISTENT WITH PREVIOUSLY REPORTED AND CALLED VALUE. Performed at [facility], [place] [address] [place], [place] [address]    Culture PENDING  Incomplete   Report Status PENDING  Incomplete  Blood Culture (routine x 2)     Status: None (Preliminary result)   Collection Time: 03/30/18 12:15 PM  Result Value Ref Range Status   Specimen Description BLOOD BLOOD RIGHT ARM  Final   Special Requests   Final    BOTTLES DRAWN AEROBIC AND ANAEROBIC Blood Culture adequate volume   Culture  Setup Time   Final    GRAM POSITIVE COCCI IN BOTH AEROBIC AND ANAEROBIC BOTTLES Organism ID to follow CRITICAL RESULT CALLED TO, READ BACK BY AND VERIFIED WITHKarsten Ro [facility] 1540  03/31/18 A BROWNING Performed at [facility], [place] [address] [place], [place] [address]    Culture PENDING  Incomplete   Report Status PENDING  Incomplete  Blood Culture ID Panel (Reflexed)     Status: Abnormal   Collection Time: 03/30/18 12:15 PM  Result Value Ref Range Status   Enterococcus species NOT DETECTED NOT DETECTED Final   Listeria monocytogenes NOT DETECTED NOT DETECTED Final   Staphylococcus species DETECTED (A) NOT DETECTED Final    Comment: CRITICAL RESULT CALLED TO, READ BACK BY AND VERIFIED WITHLenna Sciara [facility] PHARMD 1950 03/31/18 A BROWNING    Staphylococcus aureus (BCID) DETECTED (A) NOT DETECTED Final    Comment: Methicillin (oxacillin) susceptible Staphylococcus aureus (MSSA). Preferred therapy is anti staphylococcal beta lactam antibiotic (Cefazolin or Nafcillin), unless clinically contraindicated. CRITICAL RESULT CALLED TO, READ BACK BY AND VERIFIED WITHKarsten Ro [facility] 9326 03/31/18 A BROWNING    Methicillin resistance NOT DETECTED NOT DETECTED Final   Streptococcus species NOT DETECTED NOT DETECTED Final   Streptococcus agalactiae NOT DETECTED NOT DETECTED Final   Streptococcus pneumoniae NOT DETECTED NOT DETECTED Final   Streptococcus pyogenes NOT DETECTED NOT DETECTED Final   Acinetobacter baumannii NOT DETECTED NOT DETECTED Final   Enterobacteriaceae species NOT DETECTED NOT DETECTED Final   Enterobacter cloacae complex NOT DETECTED NOT DETECTED Final   Escherichia coli NOT DETECTED NOT DETECTED Final   Klebsiella oxytoca NOT DETECTED NOT DETECTED Final   Klebsiella pneumoniae NOT DETECTED NOT DETECTED Final   Proteus species NOT DETECTED NOT DETECTED Final   Serratia marcescens NOT DETECTED NOT DETECTED Final   Haemophilus influenzae NOT DETECTED NOT DETECTED Final   Neisseria meningitidis NOT DETECTED NOT DETECTED Final   Pseudomonas aeruginosa NOT DETECTED NOT DETECTED Final   Candida albicans NOT DETECTED NOT DETECTED Final   Candida glabrata  NOT DETECTED NOT DETECTED Final   Candida krusei NOT DETECTED NOT DETECTED Final   Candida parapsilosis NOT DETECTED NOT DETECTED Final   Candida tropicalis NOT DETECTED NOT DETECTED Final    Comment: Performed at [facility], [place] [address] [place], [place] [address]    Microbiology: Recent Results (from the past 240 hour(s))  Blood Culture (routine x 2)     Status: None (Preliminary result)   Collection Time: 03/30/18 12:06 PM  Result Value Ref Range Status   Specimen Description BLOOD RIGHT ANTECUBITAL  Final   Special Requests   Final    BOTTLES DRAWN AEROBIC AND ANAEROBIC Blood Culture adequate volume   Culture  Setup Time   Final    GRAM POSITIVE COCCI IN BOTH AEROBIC AND ANAEROBIC BOTTLES CRITICAL VALUE NOTED.  VALUE IS CONSISTENT WITH PREVIOUSLY REPORTED AND CALLED VALUE. Performed at [facility], [address] [place], [place] [address]    Culture PENDING  Incomplete   Report Status PENDING  Incomplete  Blood Culture (routine x 2)     Status: None (Preliminary result)   Collection Time: 03/30/18 12:15 PM  Result Value Ref Range Status   Specimen Description BLOOD BLOOD RIGHT ARM  Final   Special Requests   Final    BOTTLES DRAWN AEROBIC AND ANAEROBIC Blood Culture adequate volume   Culture  Setup Time   Final    GRAM POSITIVE COCCI IN BOTH AEROBIC AND ANAEROBIC BOTTLES Organism ID to follow CRITICAL RESULT CALLED TO, READ BACK BY AND VERIFIED WITHKarsten Ro [facility] 5093 03/31/18 A BROWNING Performed at [facility], [place] [address], [place], [place] [address]    Culture PENDING  Incomplete   Report Status PENDING  Incomplete  Blood Culture ID Panel (Reflexed)     Status: Abnormal   Collection Time: 03/30/18 12:15 PM  Result Value Ref Range Status   Enterococcus species NOT DETECTED NOT DETECTED Final   Listeria monocytogenes NOT DETECTED NOT DETECTED Final   Staphylococcus species DETECTED (A) NOT DETECTED Final    Comment: CRITICAL RESULT  CALLED TO, READ BACK BY AND VERIFIED WITHLenna Sciara [facility] PHARMD 4580 03/31/18 A BROWNING    Staphylococcus aureus (BCID) DETECTED (A) NOT DETECTED Final    Comment: Methicillin (oxacillin) susceptible Staphylococcus aureus (MSSA). Preferred therapy is anti staphylococcal beta lactam antibiotic (Cefazolin or Nafcillin), unless clinically contraindicated. CRITICAL RESULT CALLED TO, READ BACK BY AND VERIFIED WITHKarsten Ro [facility] 9983 03/31/18 A BROWNING    Methicillin resistance NOT DETECTED NOT DETECTED Final   Streptococcus species NOT DETECTED NOT DETECTED Final   Streptococcus agalactiae NOT DETECTED NOT DETECTED Final   Streptococcus pneumoniae NOT DETECTED NOT DETECTED Final   Streptococcus pyogenes NOT DETECTED NOT DETECTED Final   Acinetobacter baumannii NOT DETECTED NOT DETECTED Final   Enterobacteriaceae species NOT DETECTED NOT DETECTED Final   Enterobacter cloacae complex NOT DETECTED NOT DETECTED Final   Escherichia coli NOT DETECTED NOT DETECTED Final   Klebsiella oxytoca NOT DETECTED NOT DETECTED Final   Klebsiella pneumoniae NOT DETECTED NOT DETECTED Final   Proteus species NOT DETECTED NOT DETECTED Final   Serratia marcescens NOT DETECTED NOT DETECTED Final   Haemophilus influenzae NOT DETECTED NOT DETECTED Final   Neisseria meningitidis NOT DETECTED NOT DETECTED Final   Pseudomonas aeruginosa NOT DETECTED NOT DETECTED Final   Candida albicans NOT DETECTED NOT DETECTED Final   Candida glabrata NOT DETECTED NOT DETECTED Final   Candida krusei NOT DETECTED NOT DETECTED Final   Candida parapsilosis NOT DETECTED NOT DETECTED Final   Candida tropicalis NOT DETECTED NOT DETECTED Final    Comment: Performed at [facility], [place] [address], [place], [place] [address]    Radiographs and labs were personally reviewed by me.        [place] [facility] Team Staphylococcus aureus bacteremia   Staphylococcus aureus bacteremia (SAB) is associated with a high  rate of complications and mortality.  Specific aspects of clinical management are critical to optimizing the outcome of patients with SAB.  Therefore, the [facility] Antimicrobial Management Team [facility]) has initiated an intervention aimed at improving the management of SAB at [facility].  To do so, Infectious Diseases physicians are providing an evidence-based consult for the management of all patients with SAB.     Yes No Comments  Perform follow-up blood cultures (even if the patient is  afebrile) to ensure clearance of bacteremia _0  _1    Remove vascular catheter and obtain follow-up blood cultures after the removal of the catheter _2  _3    Perform echocardiography to evaluate for endocarditis (transthoracic ECHO is 40-50% sensitive, TEE is > 90% sensitive) _4  _5  Please keep in mind, that neither test can definitively EXCLUDE endocarditis, and that should clinical suspicion remain high for endocarditis the patient should then still be treated with an "endocarditis" duration of therapy = 6 weeks  Consult electrophysiologist to evaluate implanted cardiac device (pacemaker, ICD) _6  _7    Ensure source control _8  _9  Have all abscesses been drained effectively? Have deep seeded infections (septic joints or osteomyelitis) had appropriate surgical debridement?  Investigate for "metastatic" sites of infection _10  _11  Does the patient have ANY symptom or physical exam finding that would suggest a deeper infection (back or neck pain that may be suggestive of vertebral osteomyelitis or epidural abscess, muscle pain that could be a symptom of pyomyositis)?  Keep in mind that for deep seeded infections MRI imaging with contrast is preferred rather than other often insensitive tests such as plain x-rays, especially early in a patient's presentation.  Change antibiotic therapy to __________________ _12  _13  Beta-lactam antibiotics are preferred for MSSA due to higher cure rates.   If on Vancomycin, goal trough  should be 15 - 20 mcg/mL  Estimated duration of IV antibiotic therapy:   _14  _15  Consult case management for probably prolonged outpatient IV antibiotic therapy     Bobby Rumpf, MD [facility] [place] [facility] [phone] 03/31/2018, 1:06 PM

## 2018-03-31 NOTE — Progress Notes (Signed)
Plans noted for palliative care. If patient's desires change and he wants to proceed with right leg amputation, please let me know   Malik Stanton

## 2018-03-31 NOTE — Progress Notes (Signed)
CRITICAL VALUE ALERT  Critical Value:  Potassium 2.5  Date & Time Notified:  03/31/18 0302  Provider Notified:  Clance Boll. Bodenheimer, NP  Orders Received/Actions taken:  KCl 10mEq IVPB x6

## 2018-03-31 NOTE — Progress Notes (Signed)
Pharmacy Antibiotic Note  Malik Stanton is a 67 y.o. male admitted on 03/30/2018 with gangrenous toes in right foot. ID consulted and pharmacy has been consulted on 03/31/18 for Cefazolin dosing for Staph bacteremia. 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  Tc 100.1,  Tm 101.9,  WBC 26 >22.6, LA 2.96>1.7>1.5 AKI : SCr 2.42> 2.37  CrCl 37 ml/min.  Plan: Cefazolin 1 g IV q8 hours Monitor clinical status, renal function and culture results daily.    Height: 5\' 10"  (177.8 cm) Weight: 109 lb 12.6 oz (49.8 kg) IBW/kg (Calculated) : 73  Temp (24hrs), Avg:99.7 F (37.6 C), Min:98 F (36.7 C), Max:101.9 F (38.8 C)  Recent Labs  Lab 03/30/18 1200 03/30/18 1206 03/30/18 1428 03/30/18 2105 03/31/18 0036 03/31/18 0210  WBC  --  26.0*  --   --   --  22.6*  CREATININE  --  2.42*  --   --   --  1.37*  LATICACIDVEN 4.77*  --  2.96* 1.7 1.5  --     Estimated Creatinine Clearance: 37.4 mL/min (A) (by C-G formula based on SCr of 1.37 mg/dL (H)).    No Known Allergies  Antimicrobials this admission: Cefepime  11/22>11/23 Vanc 11/22 x1 Cefazlin 11/23>>   Dose adjustments this admission:   Microbiology results: 11/22 BCx x2 : gpc    BCID: staph aureus  11/22 BCX x2: ngtd  Thank you for allowing pharmacy to be a part of this patient's care. Noah Delaineuth Usiel Astarita, RPh Clinical Pharmacist Please check AMION for all [facility] Pharm[facility] phone numbers After 10:00 PM, call Main Pharmacy [phone] 03/31/2018 2:36 PM

## 2018-03-31 NOTE — Progress Notes (Signed)
PHARMACY - PHYSICIAN COMMUNICATION CRITICAL VALUE ALERT - BLOOD CULTURE IDENTIFICATION (BCID)  Malik Stanton is an 67 y.o. male who [facility] with a chief complaint of right lower extremity ischemia   Assessment:  MSSA Bacteremia   Name of physician (or Provider) Contacted: Bodenheimer ([facility])  Current antibiotics: Cefepime  Changes to prescribed antibiotics recommended:  Continue Cefepime for now, consider change to Cefazolin later today  Results for orders placed or performed during the hospital encounter of 03/30/18  Blood Culture ID Panel (Reflexed) (Collected: 03/30/2018 12:15 PM)  Result Value Ref Range   Enterococcus species NOT DETECTED NOT DETECTED   Listeria monocytogenes NOT DETECTED NOT DETECTED   Staphylococcus species DETECTED (A) NOT DETECTED   Staphylococcus aureus (BCID) DETECTED (A) NOT DETECTED   Methicillin resistance NOT DETECTED NOT DETECTED   Streptococcus species NOT DETECTED NOT DETECTED   Streptococcus agalactiae NOT DETECTED NOT DETECTED   Streptococcus pneumoniae NOT DETECTED NOT DETECTED   Streptococcus pyogenes NOT DETECTED NOT DETECTED   Acinetobacter baumannii NOT DETECTED NOT DETECTED   Enterobacteriaceae species NOT DETECTED NOT DETECTED   Enterobacter cloacae complex NOT DETECTED NOT DETECTED   Escherichia coli NOT DETECTED NOT DETECTED   Klebsiella oxytoca NOT DETECTED NOT DETECTED   Klebsiella pneumoniae NOT DETECTED NOT DETECTED   Proteus species NOT DETECTED NOT DETECTED   Serratia marcescens NOT DETECTED NOT DETECTED   Haemophilus influenzae NOT DETECTED NOT DETECTED   Neisseria meningitidis NOT DETECTED NOT DETECTED   Pseudomonas aeruginosa NOT DETECTED NOT DETECTED   Candida albicans NOT DETECTED NOT DETECTED   Candida glabrata NOT DETECTED NOT DETECTED   Candida krusei NOT DETECTED NOT DETECTED   Candida parapsilosis NOT DETECTED NOT DETECTED   Candida tropicalis NOT DETECTED NOT DETECTED    Malik Stanton,  Malik Stanton 03/31/2018  2:49 AM

## 2018-04-01 DIAGNOSIS — I998 Other disorder of circulatory system: Secondary | ICD-10-CM

## 2018-04-01 DIAGNOSIS — A4101 Sepsis due to Methicillin susceptible Staphylococcus aureus: Principal | ICD-10-CM

## 2018-04-01 DIAGNOSIS — K922 Gastrointestinal hemorrhage, unspecified: Secondary | ICD-10-CM | POA: Diagnosis present

## 2018-04-01 DIAGNOSIS — Z515 Encounter for palliative care: Secondary | ICD-10-CM

## 2018-04-01 DIAGNOSIS — Z7189 Other specified counseling: Secondary | ICD-10-CM

## 2018-04-01 DIAGNOSIS — N179 Acute kidney failure, unspecified: Secondary | ICD-10-CM | POA: Diagnosis present

## 2018-04-01 DIAGNOSIS — N170 Acute kidney failure with tubular necrosis: Secondary | ICD-10-CM

## 2018-04-01 DIAGNOSIS — R6520 Severe sepsis without septic shock: Secondary | ICD-10-CM

## 2018-04-01 LAB — COMPREHENSIVE METABOLIC PANEL
ALT: 89 U/L — ABNORMAL HIGH (ref 0–44)
ANION GAP: 6 (ref 5–15)
AST: 110 U/L — ABNORMAL HIGH (ref 15–41)
Albumin: 1.5 g/dL — ABNORMAL LOW (ref 3.5–5.0)
Alkaline Phosphatase: 176 U/L — ABNORMAL HIGH (ref 38–126)
BUN: 22 mg/dL (ref 8–23)
CALCIUM: 8.2 mg/dL — AB (ref 8.9–10.3)
CO2: 19 mmol/L — AB (ref 22–32)
Chloride: 116 mmol/L — ABNORMAL HIGH (ref 98–111)
Creatinine, Ser: 1.12 mg/dL (ref 0.61–1.24)
Glucose, Bld: 141 mg/dL — ABNORMAL HIGH (ref 70–99)
Potassium: 3.6 mmol/L (ref 3.5–5.1)
SODIUM: 141 mmol/L (ref 135–145)
Total Bilirubin: 1.3 mg/dL — ABNORMAL HIGH (ref 0.3–1.2)
Total Protein: 5.4 g/dL — ABNORMAL LOW (ref 6.5–8.1)

## 2018-04-01 LAB — CBC
HCT: 25.7 % — ABNORMAL LOW (ref 39.0–52.0)
HEMOGLOBIN: 8.2 g/dL — AB (ref 13.0–17.0)
MCH: 27 pg (ref 26.0–34.0)
MCHC: 31.9 g/dL (ref 30.0–36.0)
MCV: 84.5 fL (ref 80.0–100.0)
NRBC: 0 % (ref 0.0–0.2)
PLATELETS: 389 10*3/uL (ref 150–400)
RBC: 3.04 MIL/uL — ABNORMAL LOW (ref 4.22–5.81)
RDW: 16.6 % — ABNORMAL HIGH (ref 11.5–15.5)
WBC: 19.5 10*3/uL — AB (ref 4.0–10.5)

## 2018-04-01 LAB — HEPARIN LEVEL (UNFRACTIONATED)
HEPARIN UNFRACTIONATED: 0.12 [IU]/mL — AB (ref 0.30–0.70)
HEPARIN UNFRACTIONATED: 0.21 [IU]/mL — AB (ref 0.30–0.70)
HEPARIN UNFRACTIONATED: 0.31 [IU]/mL (ref 0.30–0.70)

## 2018-04-01 MED ORDER — PANTOPRAZOLE SODIUM 40 MG PO TBEC
40.0000 mg | DELAYED_RELEASE_TABLET | Freq: Two times a day (BID) | ORAL | Status: DC
  Administered 2018-04-01 – 2018-04-07 (×12): 40 mg via ORAL
  Filled 2018-04-01 (×12): qty 1

## 2018-04-01 MED ORDER — FLUCONAZOLE 100 MG PO TABS
200.0000 mg | ORAL_TABLET | Freq: Every day | ORAL | Status: DC
  Administered 2018-04-01 – 2018-04-05 (×5): 200 mg via ORAL
  Filled 2018-04-01 (×5): qty 2

## 2018-04-01 MED ORDER — ORAL CARE MOUTH RINSE
15.0000 mL | Freq: Two times a day (BID) | OROMUCOSAL | Status: DC
  Administered 2018-04-01 – 2018-04-07 (×7): 15 mL via OROMUCOSAL

## 2018-04-01 MED ORDER — HEPARIN (PORCINE) 25000 UT/250ML-% IV SOLN
1700.0000 [IU]/h | INTRAVENOUS | Status: DC
  Administered 2018-04-02: 1600 [IU]/h via INTRAVENOUS
  Filled 2018-04-01: qty 250

## 2018-04-01 NOTE — Progress Notes (Signed)
Patient Demographics:    Malik Stanton, is a 67 y.o. male, DOB - 1951/03/27, ZOX:[id number]  Admit date - 03/30/2018   Admitting Physician Malik Bevels, MD  Outpatient Primary MD for the patient is [facility]  LOS - 2   Chief Complaint  Patient presents with  . Blood Infection        Subjective:    Malik Stanton today has no fevers,    No chest pain, no vomiting no diarrhea, appears comfortable  Assessment  & Plan :    Active Problems:   Sepsis [facility])  Brief Summary  67 y.o. male with history h/o advanced dementia, hypertension, peripheral vascular disease who is well-known to vascular surgery service and was admitted last month for left foot ischemic ulcer requiring left BKA on March 01, 2018, readmitted 03/30/2018 with gangrene of the right foot leading to sepsis , at this time family has opted for palliative care and nonsurgical option, palliative care consult from 04/01/2018 appreciated   Plan:-  1)MSSA Bacteremia/Sepsis--- blood cultures from 03/30/2018 with MSSA , most likely secondary to right foot gangrene, infectious disease input appreciated, vascular surgery consult from Malik Stanton appreciated, at this time family is opting for palliative/nonsurgical treatment.  Hold off on TTE, family wants to continue iv Ancef , palliative care consult for GOC appreciated, family desires no surgical or heroic measures, but would would like medical management including IV antibiotics for now, white count is down to 19k  from 26K  2)Chronic Anemia--- Hgb is stable at 8.2 which is close to his recent baseline,  on admission was concerned about dark stools/possible GI bleed, no further bleeding noted despite being on IV heparin for PAD,  GI consult from Malik Stanton appreciated, no GI interventions planned, okay to switch Protonix to p.o.  3) FEN--- hypokalemia resolved with  replacement, magnesium was normal, give nutritional supplements  4)PAD--- Plavix on hold, right foot gangrene noted, currently on IV heparin, watch for bleeding, family declines surgical/Ortho intervention  5) Social/Ethics--plan of care discussed with patient granddaughter Malik Stanton  ([phone]) and patient's son Malik, Stanton...Marland KitchenMarland KitchenMarland Kitchen granddaughter is primary contact, palliative care consult for GOC appreciated, family desires no surgical or heroic measures, but would would like medical management including IV antibiotics for now  6)AKI----acute kidney injury, resolved AKI with hydration     creatinine on admission= 2.42 ,   baseline creatinine =0.7    , creatinine is now=1.1, renally adjust medications, avoid nephrotoxic agents / dehydration/hypotension  Disposition/Need for in-Hospital Stay- patient unable to be discharged at this time due to sepsis sepsis requiring IV Ancef antibiotic,  also needs IV heparin for PAD and right foot gangrene  Code Status : DNR   Disposition Plan  : TBD--- SNF with palliative care   Consults  : Palliative care/vascular surgery/infectious disease   DVT Prophylaxis  : IV heparin, avoid SCDs due to PAD with ischemic limb and status post BKA on the left  Lab Results  Component Value Date   PLT 389 04/01/2018    Inpatient Medications  Scheduled Meds: . sodium chloride   Intravenous Once  . aspirin EC  81 mg Oral Daily  . feeding supplement (ENSURE ENLIVE)  237 mL Oral BID BM  .  mouth rinse  15 mL Mouth Rinse BID  . pantoprazole  40 mg Oral BID  . polyethylene glycol  17 g Oral Daily  . potassium chloride  40 mEq Oral Once  . cyanocobalamin  1,000 mcg Oral Daily   Continuous Infusions: . sodium chloride Stopped (03/31/18 0653)  .  ceFAZolin (ANCEF) IV 1 g (04/01/18 1610)  . dextrose 5 % and 0.45 % NaCl with KCl 40 mEq/L 75 mL/hr at 04/01/18 0642  . heparin 1,500 Units/hr (04/01/18 0555)   PRN Meds:.sodium chloride, acetaminophen,  acetaminophen, LORazepam, methocarbamol, morphine injection, ondansetron **OR** ondansetron (ZOFRAN) IV, oxyCODONE    Anti-infectives (From admission, onward)   Start     Dose/Rate Route Frequency Ordered Stop   03/31/18 1530  ceFAZolin (ANCEF) IVPB 1 g/50 mL premix     1 g 100 mL/hr over 30 Minutes Intravenous Every 8 hours 03/31/18 1443     03/31/18 1000  ceFEPIme (MAXIPIME) 1 g in sodium chloride 0.9 % 100 mL IVPB  Status:  Discontinued     1 g 200 mL/hr over 30 Minutes Intravenous Every 24 hours 03/30/18 1525 03/31/18 1324   03/30/18 1600  vancomycin (VANCOCIN) IVPB 1000 mg/200 mL premix     1,000 mg 200 mL/hr over 60 Minutes Intravenous  Once 03/30/18 1525 03/30/18 2012   03/30/18 1145  cefTRIAXone (ROCEPHIN) 2 g in sodium chloride 0.9 % 100 mL IVPB  Status:  Discontinued     2 g 200 mL/hr over 30 Minutes Intravenous Every 24 hours 03/30/18 1136 03/30/18 1525        Objective:   Vitals:   03/31/18 1622 03/31/18 1641 04/01/18 0519 04/01/18 0804  BP: 111/87   107/72  Pulse: (!) 135     Resp:    (!) 22  Temp: (!) 101.9 F (38.8 C) 97.6 F (36.4 C) 98.2 F (36.8 C) 97.8 F (36.6 C)  TempSrc: Oral Oral Oral Oral  SpO2: 99%   99%  Weight:      Height:        Wt Readings from Last 3 Encounters:  03/30/18 49.8 kg  03/29/18 49.9 kg  03/08/18 56.9 kg     Intake/Output Summary (Last 24 hours) at 04/01/2018 1316 Last data filed at 04/01/2018 9604 Gross per 24 hour  Intake 4084.11 ml  Output 1250 ml  Net 2834.11 ml     Physical Exam Patient is examined daily including today on 04/01/18 , exams remain the same as of yesterday except that has changed   Gen:- Awake , more awake  HEENT:- [place].AT, No sclera icterus Neck-Supple Neck,No JVD,.  Lungs-diminished in bases, no wheezing  CV- S1, S2 normal, regular  Abd-  +ve B.Sounds, Abd Soft, No tenderness,    Extremity/Skin:-Left BKA stump with intact staples, right foot gangrene noted, please see photo  below Psych-affect is flat , oriented to self Neuro-generalized weakness, did not ambulate , no tremors Media Information   Document Information   Photos  Right foot 03/30/18  03/30/2018 14:18  Attached To:  Hospital Encounter on 03/30/18  Source Information   Rhyne, Ames Coupe, PA-C  [facility]      Data Review:   Micro Results Recent Results (from the past 240 hour(s))  Blood Culture (routine x 2)     Status: Abnormal (Preliminary result)   Collection Time: 03/30/18 12:06 PM  Result Value Ref Range Status   Specimen Description BLOOD RIGHT ANTECUBITAL  Final   Special Requests   Final  BOTTLES DRAWN AEROBIC AND ANAEROBIC Blood Culture adequate volume   Culture  Setup Time   Final    GRAM POSITIVE COCCI IN BOTH AEROBIC AND ANAEROBIC BOTTLES CRITICAL VALUE NOTED.  VALUE IS CONSISTENT WITH PREVIOUSLY REPORTED AND CALLED VALUE. Performed at [facility], [address], [place], [place] [address]    Culture STAPHYLOCOCCUS AUREUS (A)  Final   Report Status PENDING  Incomplete  Blood Culture (routine x 2)     Status: Abnormal (Preliminary result)   Collection Time: 03/30/18 12:15 PM  Result Value Ref Range Status   Specimen Description BLOOD BLOOD RIGHT ARM  Final   Special Requests   Final    BOTTLES DRAWN AEROBIC AND ANAEROBIC Blood Culture adequate volume   Culture  Setup Time   Final    GRAM POSITIVE COCCI IN BOTH AEROBIC AND ANAEROBIC BOTTLES CRITICAL RESULT CALLED TO, READ BACK BY AND VERIFIED WITHMelven Sartorius [facility] 6045 03/31/18 A BROWNING Performed at [facility], [address], [place], [place] [address]    Culture STAPHYLOCOCCUS AUREUS (A)  Final   Report Status PENDING  Incomplete  Blood Culture ID Panel (Reflexed)     Status: Abnormal   Collection Time: 03/30/18 12:15 PM  Result Value Ref Range Status   Enterococcus species NOT DETECTED NOT DETECTED Final   Listeria monocytogenes NOT DETECTED NOT DETECTED Final   Staphylococcus  species DETECTED (A) NOT DETECTED Final    Comment: CRITICAL RESULT CALLED TO, READ BACK BY AND VERIFIED WITHShela Commons [facility] PHARMD 1914 03/31/18 A BROWNING    Staphylococcus aureus (BCID) DETECTED (A) NOT DETECTED Final    Comment: Methicillin (oxacillin) susceptible Staphylococcus aureus (MSSA). Preferred therapy is anti staphylococcal beta lactam antibiotic (Cefazolin or Nafcillin), unless clinically contraindicated. CRITICAL RESULT CALLED TO, READ BACK BY AND VERIFIED WITHMelven Sartorius [facility] 7829 03/31/18 A BROWNING    Methicillin resistance NOT DETECTED NOT DETECTED Final   Streptococcus species NOT DETECTED NOT DETECTED Final   Streptococcus agalactiae NOT DETECTED NOT DETECTED Final   Streptococcus pneumoniae NOT DETECTED NOT DETECTED Final   Streptococcus pyogenes NOT DETECTED NOT DETECTED Final   Acinetobacter baumannii NOT DETECTED NOT DETECTED Final   Enterobacteriaceae species NOT DETECTED NOT DETECTED Final   Enterobacter cloacae complex NOT DETECTED NOT DETECTED Final   Escherichia coli NOT DETECTED NOT DETECTED Final   Klebsiella oxytoca NOT DETECTED NOT DETECTED Final   Klebsiella pneumoniae NOT DETECTED NOT DETECTED Final   Proteus species NOT DETECTED NOT DETECTED Final   Serratia marcescens NOT DETECTED NOT DETECTED Final   Haemophilus influenzae NOT DETECTED NOT DETECTED Final   Neisseria meningitidis NOT DETECTED NOT DETECTED Final   Pseudomonas aeruginosa NOT DETECTED NOT DETECTED Final   Candida albicans NOT DETECTED NOT DETECTED Final   Candida glabrata NOT DETECTED NOT DETECTED Final   Candida krusei NOT DETECTED NOT DETECTED Final   Candida parapsilosis NOT DETECTED NOT DETECTED Final   Candida tropicalis NOT DETECTED NOT DETECTED Final    Comment: Performed at [facility], [address], [place], [place] [address]    Radiology Reports Dg Chest Port 1 View  Result Date: 03/30/2018 CLINICAL DATA:  Hypotension. Possible sepsis. EXAM: PORTABLE CHEST 1  VIEW COMPARISON:  02/17/2018. FINDINGS: Normal sized heart. Mildly tortuous and calcified thoracic aorta. Clear lungs with normal vascularity. Bilateral AC joint degenerative changes. IMPRESSION: No acute abnormality. Electronically Signed   By: Beckie Salts M.D.   On: 03/30/2018 11:57  CBC Recent Labs  Lab 03/30/18 1206 03/30/18 2105 03/31/18 0210 04/01/18 0451  WBC 26.0*  --  22.6* 19.5*  HGB 8.3* 8.8* 8.8* 8.2*  HCT 26.5* 25.6* 27.0* 25.7*  PLT 482*  --  446* 389  MCV 84.4  --  82.8 84.5  MCH 26.4  --  27.0 27.0  MCHC 31.3  --  32.6 31.9  RDW 17.0*  --  15.9* 16.6*  LYMPHSABS 1.0  --   --   --   MONOABS 1.0  --   --   --   EOSABS 0.0  --   --   --   BASOSABS 0.1  --   --   --     Chemistries  Recent Labs  Lab 03/30/18 1206 03/31/18 0210 03/31/18 2105 04/01/18 0451  NA 136 138 141 141  K 3.6 2.5* 2.7* 3.6  CL 105 108 111 116*  CO2 17* 19* 19* 19*  GLUCOSE 200* 167* 113* 141*  BUN 80* 46* 23 22  CREATININE 2.42* 1.37* 1.16 1.12  CALCIUM 8.9 8.4* 8.2* 8.2*  MG  --  2.2  --   --   AST 129*  --   --  110*  ALT 124*  --   --  89*  ALKPHOS 246*  --   --  176*  BILITOT 0.5  --   --  1.3*   ------------------------------------------------------------------------------------------------------------------ No results for input(s): CHOL, HDL, LDLCALC, TRIG, CHOLHDL, LDLDIRECT in the last 72 hours.  No results found for: HGBA1C ------------------------------------------------------------------------------------------------------------------ No results for input(s): TSH, T4TOTAL, T3FREE, THYROIDAB in the last 72 hours.  Invalid input(s): FREET3 ------------------------------------------------------------------------------------------------------------------ No results for input(s): VITAMINB12, FOLATE, FERRITIN, TIBC, IRON, RETICCTPCT in the last 72 hours.  Coagulation profile Recent Labs  Lab 03/30/18 1220  INR 1.46    No results for input(s): DDIMER in the last  72 hours.  Cardiac Enzymes No results for input(s): CKMB, TROPONINI, MYOGLOBIN in the last 168 hours.  Invalid input(s): CK ------------------------------------------------------------------------------------------------------------------ No results found for: BNP   Shon Haleourage Azzam Mehra M.D on 04/01/2018 at 1:16 PM  Pager---[phone] Go to [email or web address] - password [email or web address] for contact info  [facility] - Office  [phone]

## 2018-04-01 NOTE — Progress Notes (Signed)
ANTICOAGULATION CONSULT NOTE   Pharmacy Consult for Heparin Indication: Ischemic Leg  No Known Allergies  Patient Measurements: Height: 5\' 10"  (177.8 cm) Weight: 109 lb 12.6 oz (49.8 kg) IBW/kg (Calculated) : 73 Heparin Dosing Weight: 49.8 kg  Vital Signs: Temp: 97.3 F (36.3 C) (11/24 1414) Temp Source: Oral (11/24 1414) BP: 109/63 (11/24 1414) Pulse Rate: 101 (11/24 1414)  Labs: Recent Labs    03/30/18 1206 03/30/18 1220 03/30/18 2105 03/31/18 0210  03/31/18 2105 04/01/18 0451 04/01/18 1328  HGB 8.3*  --  8.8* 8.8*  --   --  8.2*  --   HCT 26.5*  --  25.6* 27.0*  --   --  25.7*  --   PLT 482*  --   --  446*  --   --  389  --   LABPROT  --  17.5*  --   --   --   --   --   --   INR  --  1.46  --   --   --   --   --   --   HEPARINUNFRC  --   --   --  <0.10*   < > <0.10* 0.12* 0.21*  CREATININE 2.42*  --   --  1.37*  --  1.16 1.12  --    < > = values in this interval not displayed.    Estimated Creatinine Clearance: 45.7 mL/min (by C-G formula based on SCr of 1.12 mg/dL).   Medical History: Past Medical History:  Diagnosis Date  . Blood clot in vein 02/2018  . Dementia (HCC)   . Hypertension   . PVD (peripheral vascular disease) (HCC)      Assessment: Patient [age] yoM presenting with gangrenous toes in right foot. Pharmacy has been consulted for heparin dosing. Patient was previously on apixaban, but this was stopped during October 2019 admission and patient remains on clopidogrel.  Recent BKA. Per GI, started on IV heparin drip on 03/30/18 at low dose given history of chronic anemia and occult blood positive stool.  Heparin level were undetectable on lower heparin drip rate.   Current heparin level = 0.21 on heparin rate 1500 units/hr,  Subtherapeutic level but it is trending up now toward goal 0.3-0.5 units/ml.  HgB 8.2 low/stable and PLT 389.  No bleeding and no issues with IV infusion per RN.   -Note: During last admission, patient received heparin gtt as  high as 1500-1700 units/hr.   Goal of Therapy:  Heparin level 0.3-0.5 units/mL Monitor platelets by anticoagulation protocol: Yes   Plan:  Increase heparin drip to 1600 units/hr Re-check heparin level in 6 hours Monitor s/sx of bleed  Noah Delaineuth Deneene Tarver, RPh Clinical Pharmacist Pager: [phone] Please check AMION for all [facility] Pharm[facility] phone numbers After 10:00 PM, call Main Pharmacy [phone] 04/01/2018 3:45 PM

## 2018-04-01 NOTE — Progress Notes (Signed)
ANTICOAGULATION CONSULT NOTE   Pharmacy Consult for Heparin Indication: Ischemic Leg  No Known Allergies  Patient Measurements: Height: 5\' 10"  (177.8 cm) Weight: 109 lb 12.6 oz (49.8 kg) IBW/kg (Calculated) : 73 Heparin Dosing Weight: 49.8 kg  Vital Signs: Temp: 102.3 F (39.1 C) (11/24 2324) Temp Source: Axillary (11/24 2324) BP: 136/78 (11/24 2043) Pulse Rate: 109 (11/24 2043)  Labs: Recent Labs    03/30/18 1206 03/30/18 1220 03/30/18 2105 03/31/18 0210  03/31/18 2105 04/01/18 0451 04/01/18 1328 04/01/18 2145  HGB 8.3*  --  8.8* 8.8*  --   --  8.2*  --   --   HCT 26.5*  --  25.6* 27.0*  --   --  25.7*  --   --   PLT 482*  --   --  446*  --   --  389  --   --   LABPROT  --  17.5*  --   --   --   --   --   --   --   INR  --  1.46  --   --   --   --   --   --   --   HEPARINUNFRC  --   --   --  <0.10*   < > <0.10* 0.12* 0.21* 0.31  CREATININE 2.42*  --   --  1.37*  --  1.16 1.12  --   --    < > = values in this interval not displayed.    Estimated Creatinine Clearance: 45.7 mL/min (by C-G formula based on SCr of 1.12 mg/dL).   Assessment: 67 y.o. male with PaD and ischemic RLE for heparin   Goal of Therapy:  Heparin level 0.3-0.5 units/mL Monitor platelets by anticoagulation protocol: Yes   Plan:  Continue Heparin at current rate  Follow-up am labs.   Geannie RisenGreg Tani Virgo, PharmD, BCPS

## 2018-04-01 NOTE — Consult Note (Signed)
Consultation Note Date: 04/01/2018   Patient Name: Malik Stanton  DOB: 1950-10-27  MRN: [id number]  Age / Sex: 67 y.o., male  PCP: [place] Referring Physician: Roxan Hockey, MD  Reason for Consultation: Establishing goals of care  HPI/Patient Profile: 67 y.o. male  with past medical history of PVD, dementia, hypertension admitted on 03/30/2018 with right foot gangrene. 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 In ED, patient found to be septic likely due to right foot gangrene. Blood cultures positive for MSSA bacteremia. ID following. Patient receiving IV ancef. Vascular surgery consulted and recommending RLE amputation. Per attending, family opting against surgical intervention. Palliative medicine consultation for goals of care.   Clinical Assessment and Goals of Care:  I have reviewed medical records, discussed with care team, and met with granddaughter Willeen Cass) to discuss diagnosis, prognosis, GOC, EOL wishes, disposition and options. Patient is awake and oriented to person. Reoriented him to place and situation. He is waiting for lunch. Denies pain or discomfort. Spoke with Willeen Cass in family conference room.   Introduced Palliative Medicine as specialized medical care for people living with serious illness. It focuses on providing relief from the symptoms and stress of a serious illness. The goal is to improve quality of life for both the patient and the family.  Attempted to discuss a brief life review with Porcha. She is very short with me. She tells me she has been the primary support system for her grandfather since he moved to [place] in 2015. She speaks of his three children being uninvolved in his care. She does not wish to go into further details.   Discussed events leading up to hospitalization including recent left BKA. Since that time, he has been at [facility] under rehab. I ask about  diagnosis of dementia. She shares that he has had ongoing confusion for the last few years.   Discussed course of hospitalization including diagnoses and interventions. Porcha recalls her conversation with Dr. Denton Brick and the decision against pursuing surgical intervention for gangrene. She shares that her grandfather did not want the left BKA but he finally agreed with the surgery after her encouragement. She shares her frustration that people weren't doing their job and 'wound got infected' in regards to the left leg prior to amputation.   Willeen Cass continues to ask how much time he has left. I explained poor prognosis (possibly even weeks) with high risk for recurrent sepsis secondary to wound and not pursuing surgical intervention. Willeen Cass speaks of wanting "comfort" for her grandfather. We discussed two pathways moving forward, full comfort care versus continued medical management with antibiotics but also that he may require extended antibiotics with PICC line placement. Introduced hospice options, SNF with hospice versus hospice facility but also explaining that neither facility would continue IV antibiotics under hospice services.   At this time, Willeen Cass wishes to continue IV antibiotics. I explained to her that I would further discuss with ID how long he will require IV antibiotics. Also explained that repeat blood cultures are pending.   I  explored Porcha's thoughts on whether or not Keena understands his current medical status. She spoke with him yesterday and believes he knows "he is very sick" but does not know another amputation was recommended. She also shares that if her grandfather speaks of being "tired" and wanting antibiotics to be discontinued, then we can discontinue these antibiotics without letting her know.   Discussed watchful waiting period but also ensuring medications are on board for pain/symptom management.   Questions and concerns were addressed. PMT contact information  given.    SUMMARY OF RECOMMENDATIONS    Initial palliative discussion with patient's granddaughter Willeen Cass) who shares that she is primary support system. His children are minimally involved.   Granddaughter confirms decision against surgical intervention and wish for "comfort" for her grandfather. She does wish for IV antibiotics to be continued at this time. If patient requires extended IV antibiotics (per ID recommendation), will not be eligible for hospice services. Pending repeat blood cultures. Will continue current plan of care and interventions per attending.   PMT will follow.  Code Status/Advance Care Planning:  DNR  Symptom Management:   Per attending  Palliative Prophylaxis:   Aspiration, Delirium Protocol, Frequent Pain Assessment, Oral Care and Turn Reposition  Psycho-social/Spiritual:   Desire for further Chaplaincy support:yes  Additional Recommendations: Caregiving  Support/Resources and Education on Hospice  Prognosis:   Poor prognosis with sepsis secondary to right foot gangrene, MSSA bacteremia, PVD, AKI.  Discharge Planning: To Be Determined      Primary Diagnoses: Present on Admission: . Sepsis ([place])   I have reviewed the medical record, interviewed the patient and family, and examined the patient. The following aspects are pertinent.  Past Medical History:  Diagnosis Date  . Blood clot in vein 02/2018  . Dementia ([place])   . Hypertension   . PVD (peripheral vascular disease) ([place])    Social History   Socioeconomic History  . Marital status: Divorced    Spouse name: Not on file  . Number of children: Not on file  . Years of education: Not on file  . Highest education level: Not on file  Occupational History  . Not on file  Social Needs  . Financial resource strain: Not on file  . Food insecurity:    Worry: Not on file    Inability: Not on file  . Transportation needs:    Medical: Not on file    Non-medical: Not on file  Tobacco  Use  . Smoking status: Former Smoker    Packs/day: 0.50    Years: 50.00    Pack years: 25.00    Types: Cigarettes  . Smokeless tobacco: Never Used  Substance and Sexual Activity  . Alcohol use: Never    Frequency: Never  . Drug use: Not Currently  . Sexual activity: Not Currently  Lifestyle  . Physical activity:    Days per week: Not on file    Minutes per session: Not on file  . Stress: Not on file  Relationships  . Social connections:    Talks on phone: Not on file    Gets together: Not on file    Attends religious service: Not on file    Active member of club or organization: Not on file    Attends meetings of clubs or organizations: Not on file    Relationship status: Not on file  Other Topics Concern  . Not on file  Social History Narrative  . Not on file   No family history on  file. Scheduled Meds: . sodium chloride   Intravenous Once  . aspirin EC  81 mg Oral Daily  . feeding supplement (ENSURE ENLIVE)  237 mL Oral BID BM  . fluconazole  200 mg Oral Daily  . mouth rinse  15 mL Mouth Rinse BID  . pantoprazole  40 mg Oral BID  . polyethylene glycol  17 g Oral Daily  . potassium chloride  40 mEq Oral Once  . cyanocobalamin  1,000 mcg Oral Daily   Continuous Infusions: . sodium chloride Stopped (03/31/18 0653)  .  ceFAZolin (ANCEF) IV 1 g (04/01/18 1458)  . dextrose 5 % and 0.45 % NaCl with KCl 40 mEq/L 75 mL/hr at 04/01/18 4481  . heparin 1,600 Units/hr (04/01/18 1555)   PRN Meds:.sodium chloride, acetaminophen, acetaminophen, LORazepam, methocarbamol, morphine injection, ondansetron **OR** ondansetron (ZOFRAN) IV, oxyCODONE Medications Prior to Admission:  Prior to Admission medications   Medication Sig Start Date End Date Taking? Authorizing Provider  acetaminophen (TYLENOL) 325 MG tablet Take 2 tablets (650 mg total) by mouth every 6 (six) hours as needed for mild pain (or Fever >/= 101). 03/07/18  Yes Hongalgi, Lenis Dickinson, MD  amLODIPine-Valsartan-HCTZ  [id number] MG TABS Take 1 tablet by mouth daily.    Yes [provider]  clopidogrel (PLAVIX) 75 MG tablet Take 1 tablet (75 mg total) by mouth daily. Patient taking differently: Take 75 mg by mouth every evening.  02/23/18  Yes Patrecia Pour, MD  ferrous sulfate 325 (65 FE) MG tablet Take 1 tablet (325 mg total) by mouth 2 (two) times daily with a meal. 03/07/18  Yes Hongalgi, Lenis Dickinson, MD  HYDROcodone-acetaminophen (NORCO/VICODIN) 5-325 MG tablet Take 1 tablet by mouth every 8 (eight) hours as needed for moderate pain or severe pain. 03/07/18  Yes Hongalgi, Lenis Dickinson, MD  methocarbamol (ROBAXIN) 500 MG tablet Take 500 mg by mouth daily as needed for muscle spasms.    Yes [provider]  metoprolol succinate (TOPROL-XL) 25 MG 24 hr tablet Take 25 mg by mouth daily.  12/06/17  Yes [provider]  NUTRITIONAL SUPPLEMENT LIQD Take 120 mLs by mouth 2 (two) times daily. Med[employer]   Yes [provider]  polyethylene glycol (MIRALAX / GLYCOLAX) packet Take 17 g by mouth daily. 03/07/18  Yes Hongalgi, Lenis Dickinson, MD  vitamin B-12 1000 MCG tablet Take 1 tablet (1,000 mcg total) by mouth daily. 03/08/18  Yes Hongalgi, Lenis Dickinson, MD   No Known Allergies Review of Systems  Unable to perform ROS: Mental status change   Physical Exam  Constitutional: He is easily aroused. He appears ill.  HENT:  Head: Normocephalic and atraumatic.  Pulmonary/Chest: No accessory muscle usage. No tachypnea. No respiratory distress.  Abdominal: There is no tenderness.  Neurological: He is alert and easily aroused.  Oriented to person. Reoriented to place/situation.  Skin: Skin is warm and dry.  Left BKA staples C/D/I   Psychiatric: His speech is delayed. Cognition and memory are impaired. He is inattentive.  Nursing note and vitals reviewed.   Vital Signs: BP 109/63 (BP Location: Left Arm)   Pulse (!) 101   Temp (!) 97.3 F (36.3 C) (Oral)   Resp (!) 22   Ht _0  (1.778 m)   Wt 49.8 kg    SpO2 98%   BMI 15.75 kg/m  Pain Scale: 0-10   Pain Score: 0-No pain   SpO2: SpO2: 98 % O2 Device:SpO2: 98 % O2 Flow Rate: .   IO: Intake/output summary:  Intake/Output Summary (Last 24 hours) at 04/01/2018 1652 Last data filed at 04/01/2018 3254 Gross per 24 hour  Intake 4084.11 ml  Output 950 ml  Net 3134.11 ml    LBM: Last BM Date: 03/30/18 Baseline Weight: Weight: 49.8 kg Most recent weight: Weight: 49.8 kg     Palliative Assessment/Data: PPS 30%    Flowsheet Rows     Most Recent Value  Intake Tab  Referral Department  Hospitalist  Unit at Time of Referral  Med/Surg Unit  Palliative Care Primary Diagnosis  Sepsis/Infectious Disease  Palliative Care Type  New Palliative care  Reason for referral  Clarify Goals of Care  Date first seen by Palliative Care  04/01/18  Clinical Assessment  Palliative Performance Scale Score  30%  Psychosocial & Spiritual Assessment  Palliative Care Outcomes  Patient/Family meeting held?  Yes  Who was at the meeting?  granddaughter/cousin  Palliative Care Outcomes  Clarified goals of care, Counseled regarding hospice, Provided end of life care assistance, Provided psychosocial or spiritual support, ACP counseling assistance      Time In: 1150 Time Out: 1300 Time Total: 3mn Greater than 50%  of this time was spent counseling and coordinating care related to the above assessment and plan.  Signed by:  MIhor Dow FNP-C Palliative Medicine Team  Phone: [phone]: [phone]  Please contact Palliative Medicine Team phone at [phone] questions and concerns.  For individual provider: See AShea Evans

## 2018-04-01 NOTE — Progress Notes (Signed)
ANTICOAGULATION CONSULT NOTE   Pharmacy Consult for Heparin Indication: Ischemic Leg  No Known Allergies  Patient Measurements: Height: 5\' 10"  (177.8 cm) Weight: 109 lb 12.6 oz (49.8 kg) IBW/kg (Calculated) : 73 Heparin Dosing Weight: 49.8 kg  Vital Signs: Temp: 98.2 F (36.8 C) (11/24 0519) Temp Source: Oral (11/24 0519)  Labs: Recent Labs    03/30/18 1206 03/30/18 1220 03/30/18 2105  03/31/18 0210 03/31/18 1215 03/31/18 2105 04/01/18 0451  HGB 8.3*  --  8.8*  --  8.8*  --   --  8.2*  HCT 26.5*  --  25.6*  --  27.0*  --   --  25.7*  PLT 482*  --   --   --  446*  --   --  389  LABPROT  --  17.5*  --   --   --   --   --   --   INR  --  1.46  --   --   --   --   --   --   HEPARINUNFRC  --   --   --    < > <0.10* <0.10* <0.10* 0.12*  CREATININE 2.42*  --   --   --  1.37*  --  1.16 1.12   < > = values in this interval not displayed.    Estimated Creatinine Clearance: 45.7 mL/min (by C-G formula based on SCr of 1.12 mg/dL).   Medical History: Past Medical History:  Diagnosis Date  . Blood clot in vein 02/2018  . Dementia (HCC)   . Hypertension   . PVD (peripheral vascular disease) ([facility])      Assessment: Patient [age] yoM presenting with gangrenous toes in right foot. Pharmacy has been consulted for heparin dosing. Patient was previously on apixaban, but this was stopped during October 2019 admission and patient remains on clopidogrel.   Per GI, will initiate heparin at low dose given history of chronic anemia and occult blood positive stool. Currently, HgB 8.3 and PLT 482.   During last admission, patient received heparin gtt as high as 1500-1700 units/hr. Due to recent BKA and concern for GIB, will start at much lower rate and titrate faster if needed.   11/24 AM update: heparin level low this AM, lost IV access yesterday evening but heparin has been running good since new access placed  Goal of Therapy:  Heparin level 0.3-0.5 units/mL Monitor platelets by  anticoagulation protocol: Yes   Plan:  Increase heparin drip to 1500 units/hr Re-check heparin level in 6-8 hours Monitor s/sx of bleed  Abran DukeJames Damyia Strider, PharmD, BCPS Clinical Pharmacist Phone: [phone]

## 2018-04-01 NOTE — Progress Notes (Signed)
Patient sitting up in no evident distress, no complaints (hard to interpret that, given his organic brain syndrome).    #1:  Elevated LFT's, contracted GB w/ possible stones  Patient's liver chemistries are mildly improved.    Given that this was staph aureus bacteremia in the setting of known lower extremity ischemia (therefore sepsis not likely of GI tract origin), I think it is much more likely that his elevated liver chemistries reflect "reactive hepatopathy" rather than cholangitis, even allowing for the fact he has a contracted gallbladder.  #2  Heme positive stool and anemia  Hemoglobin has been stable over the past 48 hours even while on heparin infusion.    Discussion and recommendations:  Given this patient's overall clinical status, I think it is reasonable to continue a nonaggressive approach from the GI perspective.    1.  Concerning his elevated liver chemistries, I would favor rechecking those periodically but would probably reserve further evaluation of the gallbladder or bile duct (for example, HIDA scan) for definite upper abdominal symptoms, food intolerance, etc.  2.  Concerning his heme positive stool, I would continue PPI therapy.  I would be okay transitioning to oral therapy instead of IV infusion.  I would reserve endoscopic evaluation for acute, destabilizing, overt hemorrhage  We will sign off.  However, please feel free to call us if you have any questions or if we can be of further assistance in this patient's care.  Florencia Reasonsobert V. Cashlynn Yearwood, M.D. Pager [phone] If no answer or after 5 PM call [phone]

## 2018-04-01 NOTE — Progress Notes (Signed)
INFECTIOUS DISEASE PROGRESS NOTE  ID: Malik Stanton is a 67 y.o. male with  Active Problems:   Sepsis (HCC)  Subjective: No complaints.   Abtx:  Anti-infectives (From admission, onward)   Start     Dose/Rate Route Frequency Ordered Stop   03/31/18 1530  ceFAZolin (ANCEF) IVPB 1 g/50 mL premix     1 g 100 mL/hr over 30 Minutes Intravenous Every 8 hours 03/31/18 1443     03/31/18 1000  ceFEPIme (MAXIPIME) 1 g in sodium chloride 0.9 % 100 mL IVPB  Status:  Discontinued     1 g 200 mL/hr over 30 Minutes Intravenous Every 24 hours 03/30/18 1525 03/31/18 1324   03/30/18 1600  vancomycin (VANCOCIN) IVPB 1000 mg/200 mL premix     1,000 mg 200 mL/hr over 60 Minutes Intravenous  Once 03/30/18 1525 03/30/18 2012   03/30/18 1145  cefTRIAXone (ROCEPHIN) 2 g in sodium chloride 0.9 % 100 mL IVPB  Status:  Discontinued     2 g 200 mL/hr over 30 Minutes Intravenous Every 24 hours 03/30/18 1136 03/30/18 1525      Medications:  Scheduled: . sodium chloride   Intravenous Once  . aspirin EC  81 mg Oral Daily  . feeding supplement (ENSURE ENLIVE)  237 mL Oral BID BM  . mouth rinse  15 mL Mouth Rinse BID  . polyethylene glycol  17 g Oral Daily  . potassium chloride  40 mEq Oral Once  . cyanocobalamin  1,000 mcg Oral Daily    Objective: Vital signs in last 24 hours: Temp:  [97.6 F (36.4 C)-101.9 F (38.8 C)] 97.8 F (36.6 C) (11/24 0804) Pulse Rate:  [123-135] 135 (11/23 1622) Resp:  [22] 22 (11/24 0804) BP: (107-111)/(72-87) 107/72 (11/24 0804) SpO2:  [99 %] 99 % (11/24 0804)   General appearance: alert, cooperative and no distress Resp: clear to auscultation bilaterally Cardio: regular rate and rhythm GI: normal findings: bowel sounds normal and soft, non-tender Extremities: R foot black  Lab Results Recent Labs    03/31/18 0210 03/31/18 2105 04/01/18 0451  WBC 22.6*  --  19.5*  HGB 8.8*  --  8.2*  HCT 27.0*  --  25.7*  NA 138 141 141  K 2.5* 2.7* 3.6  CL 108 111 116*   CO2 19* 19* 19*  BUN 46* 23 22  CREATININE 1.37* 1.16 1.12   Liver Panel Recent Labs    03/30/18 1206 04/01/18 0451  PROT 6.9 5.4*  ALBUMIN 2.0* 1.5*  AST 129* 110*  ALT 124* 89*  ALKPHOS 246* 176*  BILITOT 0.5 1.3*   Sedimentation Rate No results for input(s): ESRSEDRATE in the last 72 hours. C-Reactive Protein No results for input(s): CRP in the last 72 hours.  Microbiology: Recent Results (from the past 240 hour(s))  Blood Culture (routine x 2)     Status: Abnormal (Preliminary result)   Collection Time: 03/30/18 12:06 PM  Result Value Ref Range Status   Specimen Description BLOOD RIGHT ANTECUBITAL  Final   Special Requests   Final    BOTTLES DRAWN AEROBIC AND ANAEROBIC Blood Culture adequate volume   Culture  Setup Time   Final    GRAM POSITIVE COCCI IN BOTH AEROBIC AND ANAEROBIC BOTTLES CRITICAL VALUE NOTED.  VALUE IS CONSISTENT WITH PREVIOUSLY REPORTED AND CALLED VALUE. Performed at [facility], [address], [place], [place] [address]    Culture STAPHYLOCOCCUS AUREUS (A)  Final   Report Status PENDING  Incomplete  Blood  Culture (routine x 2)     Status: Abnormal (Preliminary result)   Collection Time: 03/30/18 12:15 PM  Result Value Ref Range Status   Specimen Description BLOOD BLOOD RIGHT ARM  Final   Special Requests   Final    BOTTLES DRAWN AEROBIC AND ANAEROBIC Blood Culture adequate volume   Culture  Setup Time   Final    GRAM POSITIVE COCCI IN BOTH AEROBIC AND ANAEROBIC BOTTLES CRITICAL RESULT CALLED TO, READ BACK BY AND VERIFIED WITHMelven Sartorius: J [facility] [id number] 03/31/18 A BROWNING Performed at [facility], [address] [place], [place] [address]    Culture STAPHYLOCOCCUS AUREUS (A)  Final   Report Status PENDING  Incomplete  Blood Culture ID Panel (Reflexed)     Status: Abnormal   Collection Time: 03/30/18 12:15 PM  Result Value Ref Range Status   Enterococcus species NOT DETECTED NOT DETECTED Final   Listeria monocytogenes NOT  DETECTED NOT DETECTED Final   Staphylococcus species DETECTED (A) NOT DETECTED Final    Comment: CRITICAL RESULT CALLED TO, READ BACK BY AND VERIFIED WITHShela Commons: J [facility] PHAR[facility] [id number] 03/31/18 A BROWNING    Staphylococcus aureus (BCID) DETECTED (A) NOT DETECTED Final    Comment: Methicillin (oxacillin) susceptible Staphylococcus aureus (MSSA). Preferred therapy is anti staphylococcal beta lactam antibiotic (Cefazolin or Nafcillin), unless clinically contraindicated. CRITICAL RESULT CALLED TO, READ BACK BY AND VERIFIED WITHMelven Sartorius: J LEDFORD [facility] [id number] 03/31/18 A BROWNING    Methicillin resistance NOT DETECTED NOT DETECTED Final   Streptococcus species NOT DETECTED NOT DETECTED Final   Streptococcus agalactiae NOT DETECTED NOT DETECTED Final   Streptococcus pneumoniae NOT DETECTED NOT DETECTED Final   Streptococcus pyogenes NOT DETECTED NOT DETECTED Final   Acinetobacter baumannii NOT DETECTED NOT DETECTED Final   Enterobacteriaceae species NOT DETECTED NOT DETECTED Final   Enterobacter cloacae complex NOT DETECTED NOT DETECTED Final   Escherichia coli NOT DETECTED NOT DETECTED Final   Klebsiella oxytoca NOT DETECTED NOT DETECTED Final   Klebsiella pneumoniae NOT DETECTED NOT DETECTED Final   Proteus species NOT DETECTED NOT DETECTED Final   Serratia marcescens NOT DETECTED NOT DETECTED Final   Haemophilus influenzae NOT DETECTED NOT DETECTED Final   Neisseria meningitidis NOT DETECTED NOT DETECTED Final   Pseudomonas aeruginosa NOT DETECTED NOT DETECTED Final   Candida albicans NOT DETECTED NOT DETECTED Final   Candida glabrata NOT DETECTED NOT DETECTED Final   Candida krusei NOT DETECTED NOT DETECTED Final   Candida parapsilosis NOT DETECTED NOT DETECTED Final   Candida tropicalis NOT DETECTED NOT DETECTED Final    Comment: Performed at [facility], [address] [place], [place] [address]    Studies/Results: Dg Chest Port 1 View  Result Date: 03/30/2018 CLINICAL DATA:   Hypotension. Possible sepsis. EXAM: PORTABLE CHEST 1 VIEW COMPARISON:  02/17/2018. FINDINGS: Normal sized heart. Mildly tortuous and calcified thoracic aorta. Clear lungs with normal vascularity. Bilateral AC joint degenerative changes. IMPRESSION: No acute abnormality. Electronically Signed   By: Beckie SaltsSteven  Reid M.D.   On: 03/30/2018 11:57     Assessment/Plan: Staph bacteremia 4/4 PVD Necrosis of R foot Prev L BKA 03-01-18 AKI  Total days of antibiotics: 2 ancef  His foot is unchanged Repeat BCx sent this AM Suspect he will need surgery at some point.  No change in anbx at this point.  Cr improved, normal. WBC decreased.           Johny SaxJeffrey Hatcher MD, FACP Infectious Diseases (pager) [phone] [email or web address] 04/01/2018, 10:09  AM  LOS: 2 days

## 2018-04-02 ENCOUNTER — Inpatient Hospital Stay (HOSPITAL_COMMUNITY): Payer: Medicare Other

## 2018-04-02 ENCOUNTER — Encounter (HOSPITAL_COMMUNITY): Payer: Self-pay | Admitting: General Practice

## 2018-04-02 DIAGNOSIS — K922 Gastrointestinal hemorrhage, unspecified: Secondary | ICD-10-CM

## 2018-04-02 DIAGNOSIS — L03115 Cellulitis of right lower limb: Secondary | ICD-10-CM

## 2018-04-02 DIAGNOSIS — I38 Endocarditis, valve unspecified: Secondary | ICD-10-CM

## 2018-04-02 DIAGNOSIS — I739 Peripheral vascular disease, unspecified: Secondary | ICD-10-CM

## 2018-04-02 DIAGNOSIS — R7881 Bacteremia: Secondary | ICD-10-CM

## 2018-04-02 DIAGNOSIS — I058 Other rheumatic mitral valve diseases: Secondary | ICD-10-CM

## 2018-04-02 HISTORY — DX: Gastrointestinal hemorrhage, unspecified: K92.2

## 2018-04-02 LAB — CULTURE, BLOOD (ROUTINE X 2)
SPECIAL REQUESTS: ADEQUATE
Special Requests: ADEQUATE

## 2018-04-02 LAB — CBC
HCT: 28.2 % — ABNORMAL LOW (ref 39.0–52.0)
HEMOGLOBIN: 8.9 g/dL — AB (ref 13.0–17.0)
MCH: 27.1 pg (ref 26.0–34.0)
MCHC: 31.6 g/dL (ref 30.0–36.0)
MCV: 85.7 fL (ref 80.0–100.0)
Platelets: 491 10*3/uL — ABNORMAL HIGH (ref 150–400)
RBC: 3.29 MIL/uL — AB (ref 4.22–5.81)
RDW: 17.2 % — ABNORMAL HIGH (ref 11.5–15.5)
WBC: 21.6 10*3/uL — ABNORMAL HIGH (ref 4.0–10.5)
nRBC: 0 % (ref 0.0–0.2)

## 2018-04-02 LAB — HEPARIN LEVEL (UNFRACTIONATED)
Heparin Unfractionated: 0.22 IU/mL — ABNORMAL LOW (ref 0.30–0.70)
Heparin Unfractionated: 0.23 IU/mL — ABNORMAL LOW (ref 0.30–0.70)

## 2018-04-02 LAB — GLUCOSE, CAPILLARY: GLUCOSE-CAPILLARY: 113 mg/dL — AB (ref 70–99)

## 2018-04-02 MED ORDER — JUVEN PO PACK
1.0000 | PACK | Freq: Two times a day (BID) | ORAL | Status: DC
  Administered 2018-04-02 – 2018-04-07 (×7): 1 via ORAL
  Filled 2018-04-02 (×9): qty 1

## 2018-04-02 MED ORDER — CLOPIDOGREL BISULFATE 75 MG PO TABS
75.0000 mg | ORAL_TABLET | Freq: Every day | ORAL | Status: DC
  Administered 2018-04-02 – 2018-04-07 (×6): 75 mg via ORAL
  Filled 2018-04-02 (×6): qty 1

## 2018-04-02 MED ORDER — HEPARIN SODIUM (PORCINE) 5000 UNIT/ML IJ SOLN
5000.0000 [IU] | Freq: Three times a day (TID) | INTRAMUSCULAR | Status: DC
  Administered 2018-04-02 – 2018-04-07 (×13): 5000 [IU] via SUBCUTANEOUS
  Filled 2018-04-02 (×13): qty 1

## 2018-04-02 MED ORDER — CEFAZOLIN SODIUM-DEXTROSE 2-4 GM/100ML-% IV SOLN
2.0000 g | Freq: Three times a day (TID) | INTRAVENOUS | Status: DC
  Administered 2018-04-02 – 2018-04-07 (×14): 2 g via INTRAVENOUS
  Filled 2018-04-02 (×17): qty 100

## 2018-04-02 NOTE — Progress Notes (Addendum)
ANTICOAGULATION CONSULT NOTE - Follow Up Consult  Pharmacy Consult for Heparin, Ancef Indication: Ischemic L leg, MSSA bacteremia, R foot gangrene:  No Known Allergies  Patient Measurements: Height: 5\' 10"  (177.8 cm) Weight: 109 lb 12.6 oz (49.8 kg) IBW/kg (Calculated) : 73 Heparin Dosing Weight: 49.8 kg  Vital Signs: Temp: 97.6 F (36.4 C) (11/25 0514) Temp Source: Oral (11/25 0514) BP: 101/59 (11/25 0514) Pulse Rate: 89 (11/25 0514)  Labs: Recent Labs    03/30/18 1220  03/31/18 0210  03/31/18 2105 04/01/18 0451 04/01/18 1328 04/01/18 2145 04/02/18 0631  HGB  --    < > 8.8*  --   --  8.2*  --   --  8.9*  HCT  --    < > 27.0*  --   --  25.7*  --   --  28.2*  PLT  --   --  446*  --   --  389  --   --  491*  LABPROT 17.5*  --   --   --   --   --   --   --   --   INR 1.46  --   --   --   --   --   --   --   --   HEPARINUNFRC  --   --  <0.10*   < > <0.10* 0.12* 0.21* 0.31 0.22*  CREATININE  --   --  1.37*  --  1.16 1.12  --   --   --    < > = values in this interval not displayed.    Estimated Creatinine Clearance: 45.7 mL/min (by C-G formula based on SCr of 1.12 mg/dL).   Assessment:  Anticoag: Heparin for ischemic leg; Eliquis d/c in Oct s/p left BKA; - starting low d/t BKA and possible GIB. HL 0.22 low this AM. Hgb up to 8.9. Plts 491 ok. - (Oct admission required heparin gtt as high as 1500-1700 units/hr)   ID:  MSSA bacteremia, R foot gangrene: ID consulted, abx changed to Cefazolin  Right foot ischemia with gangrenous changes (Prev L BKA 03-01-18) - Tmax 102.3, currently afebrile, WBC 21.6 up, Scr stable - ID noted Would consider TEE with his bacteremia, but with known source and his mental status would hold for now.   Cefepime  11/22>11/23 Vanc 11/22 x1 Ceftriazone 11/22 x1 Fluconazole po 11/24>11/29 Cefazlin 11/23>>  1/22 BCx x2 : MSSA   BCID: staph aureus  11/22 BCX x2: MSSA 11/24: BCX x2:  ngtd  Goal of Therapy:  Heparin level 0.3-0.5  units/ml Monitor platelets by anticoagulation protocol: Yes   Plan:  Inc heparin to 1700 units/hr  (GI said low dose heparin; goal HL0.3-0.5) Recheck heparin level in 6 hrs. Daily HL and CBC Increase Cefazolin to 2g q8h for bacteremia per ID recommendations. F/u LOT   Amonte Brookover S. Merilynn Finlandobertson, PharmD, BCPS Clinical Staff Pharmacist Misty Stanleyobertson, Hiren Peplinski Stillinger 04/02/2018,8:37 AM

## 2018-04-02 NOTE — NC FL2 (Signed)
[place] MEDICAID FL2 LEVEL OF CARE SCREENING TOOL     IDENTIFICATION  Patient Name: Malik Stanton Birthdate: 1950/06/30 Sex: male Admission Date (Current Location): 03/30/2018  [facility] and [place] Number:  Producer, television/film/video and Address:  [facility], [address], [place], [place] [address]      Provider Number: [id number]  Attending Physician Name and Address:  Shon Hale, MD  Relative Name and Phone Number:  Sonia Side granddaughter, [phone]    Current Level of Care: Hospital Recommended Level of Care: Skilled Nursing Facility Prior Approval Number:    Date Approved/Denied:   PASRR Number: [id number]  Discharge Plan: SNF    Current Diagnoses: Patient Active Problem List   Diagnosis Date Noted  . Palliative care by specialist   . Goals of care, counseling/discussion   . Acute GI bleeding   . AKI (acute kidney injury) (HCC)   . Neurocognitive deficits 03/30/2018  . Sepsis (HCC) 03/30/2018  . Essential hypertension 03/01/2018  . PVD (peripheral vascular disease) (HCC) 03/01/2018  . Gangrene of foot (HCC) 03/01/2018  . Normocytic anemia 03/01/2018  . 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 Critical lower limb ischemia 02/18/2018  . Hypokalemia 02/18/2018    Orientation RESPIRATION BLADDER Height & Weight     Self, Place  Normal Incontinent, External catheter Weight: 49.8 kg Height:  5\' 10"  (177.8 cm)  BEHAVIORAL SYMPTOMS/MOOD NEUROLOGICAL BOWEL NUTRITION STATUS      Incontinent Diet(Please see DC Summary)  AMBULATORY STATUS COMMUNICATION OF NEEDS Skin   Limited Assist Verbally Surgical wounds, Other (Comment)(Closed incision on leg;wound on foot and sacrum)                       Personal Care Assistance Level of Assistance  Bathing, Feeding, Dressing Bathing Assistance: Maximum assistance Feeding assistance: Limited assistance Dressing Assistance: Limited assistance     Functional  Limitations Info  Sight, Hearing, Speech Sight Info: Adequate Hearing Info: Adequate Speech Info: Adequate    SPECIAL CARE FACTORS FREQUENCY  PT (By licensed PT), OT (By licensed OT)     PT Frequency: 5x/week OT Frequency: 3x/week            Contractures Contractures Info: Not present    Additional Factors Info  Code Status, Allergies, Isolation Precautions Code Status Info: DNR Allergies Info: NKA     Isolation Precautions Info: MRSA     Current Medications (04/02/2018):  This is the current hospital active medication list Current Facility-Administered Medications  Medication Dose Route Frequency Provider Last Rate Last Dose  . 0.9 %  sodium chloride infusion (Manually program via Guardrails IV Fluids)   Intravenous Once Jacalyn Lefevre, MD      . 0.9 %  sodium chloride infusion   Intravenous PRN Alessandra Bevels, MD   Stopped at 04/02/18 [phone]  . acetaminophen (TYLENOL) suppository 650 mg  650 mg Rectal Q6H PRN Macon Large, NP   650 mg at 04/01/18 1741  . acetaminophen (TYLENOL) tablet 650 mg  650 mg Oral Q6H PRN Alessandra Bevels, MD   650 mg at 04/01/18 2328  . aspirin EC tablet 81 mg  81 mg Oral Daily Alessandra Bevels, MD   81 mg at 04/02/18 0936  . ceFAZolin (ANCEF) IVPB 1 g/50 mL premix  1 g Intravenous Q8H Emokpae, Courage, MD 100 mL/hr at 04/02/18 0700    . dextrose 5 % and 0.45 % NaCl with KCl 40 mEq/L infusion  Intravenous Continuous Emokpae, Courage, MD 75 mL/hr at 04/02/18 0700    . feeding supplement (ENSURE ENLIVE) (ENSURE ENLIVE) liquid 237 mL  237 mL Oral BID BM Alessandra BevelsKamineni, Neelima, MD   237 mL at 04/02/18 0936  . fluconazole (DIFLUCAN) tablet 200 mg  200 mg Oral Daily Emokpae, Courage, MD   200 mg at 04/02/18 0936  . heparin ADULT infusion 100 units/mL (25000 units/26250mL sodium chloride 0.45%)  1,700 Units/hr Intravenous Continuous Norva PavlovRobertson, Crystal S, RPH 17 mL/hr at 04/02/18 0937 1,700 Units/hr at 04/02/18 0937  . LORazepam (ATIVAN) injection 1  mg  1 mg Intravenous Q6H PRN Emokpae, Courage, MD      . MEDLINE mouth rinse  15 mL Mouth Rinse BID Emokpae, Ejiroghene E, MD   15 mL at 04/01/18 2226  . methocarbamol (ROBAXIN) tablet 500 mg  500 mg Oral Daily PRN Alessandra BevelsKamineni, Neelima, MD      . morphine 2 MG/ML injection 2 mg  2 mg Intravenous Q4H PRN Emokpae, Courage, MD      . ondansetron (ZOFRAN) tablet 4 mg  4 mg Oral Q6H PRN Alessandra BevelsKamineni, Neelima, MD       Or  . ondansetron (ZOFRAN) injection 4 mg  4 mg Intravenous Q6H PRN Kamineni, Neelima, MD      . oxyCODONE (Oxy IR/ROXICODONE) immediate release tablet 5 mg  5 mg Oral Q4H PRN Kamineni, Neelima, MD      . pantoprazole (PROTONIX) EC tablet 40 mg  40 mg Oral BID Mariea ClontsEmokpae, Courage, MD   40 mg at 04/02/18 0937  . polyethylene glycol (MIRALAX / GLYCOLAX) packet 17 g  17 g Oral Daily Alessandra BevelsKamineni, Neelima, MD   17 g at 04/02/18 0937  . vitamin B-12 (CYANOCOBALAMIN) tablet 1,000 mcg  1,000 mcg Oral Daily Alessandra BevelsKamineni, Neelima, MD   1,000 mcg at 04/02/18 [phone]     Discharge Medications: Please see discharge summary for a list of discharge medications.  Relevant Imaging Results:  Relevant Lab Results:   Additional Information SS#: [id number]  Will have a PICC line for IV Ancef  Renne CriglerNadia S Maycen Degregory, LCSW

## 2018-04-02 NOTE — Progress Notes (Signed)
PHARMACY CONSULT NOTE FOR:  OUTPATIENT  PARENTERAL ANTIBIOTIC THERAPY (OPAT)  Indication: Endocarditis Regimen: Cefazolin 2g IV q8 End date: 05/13/18  IV antibiotic discharge orders are pended. To discharging provider:  please sign these orders via discharge navigator,  Select New Orders & click on the button choice - Manage This Unsigned Work.    Ulyses SouthwardMinh Pham, PharmD, BCIDP, AAHIVP, CPP Infectious Disease Pharmacist 04/02/2018 7:23 PM

## 2018-04-02 NOTE — Progress Notes (Signed)
  Echocardiogram 2D Echocardiogram has been performed.  Tiphani Mells G Dorsie Sethi 04/02/2018, 11:32 AM

## 2018-04-02 NOTE — Progress Notes (Signed)
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 Skin intact, mild bleeding from one staple steri strip placed.

## 2018-04-02 NOTE — Progress Notes (Addendum)
ANTICOAGULATION CONSULT NOTE   Pharmacy Consult for Heparin Indication: Ischemic Leg  No Known Allergies  Patient Measurements: Height: 5\' 10"  (177.8 cm) Weight: 109 lb 12.6 oz (49.8 kg) IBW/kg (Calculated) : 73 Heparin Dosing Weight: 49.8 kg  Vital Signs: Temp: 97.7 F (36.5 C) (11/25 1427) Temp Source: Oral (11/25 1427) BP: 137/79 (11/25 1427) Pulse Rate: 108 (11/25 1427)  Labs: Recent Labs    03/31/18 0210  03/31/18 2105 04/01/18 0451  04/01/18 2145 04/02/18 0631 04/02/18 1544  HGB 8.8*  --   --  8.2*  --   --  8.9*  --   HCT 27.0*  --   --  25.7*  --   --  28.2*  --   PLT 446*  --   --  389  --   --  491*  --   HEPARINUNFRC <0.10*   < > <0.10* 0.12*   < > 0.31 0.22* 0.23*  CREATININE 1.37*  --  1.16 1.12  --   --   --   --    < > = values in this interval not displayed.    Estimated Creatinine Clearance: 45.7 mL/min (by C-G formula based on SCr of 1.12 mg/dL).   Medical History: Past Medical History:  Diagnosis Date  . Acute GI bleeding 04/02/2018  . Blood clot in vein 02/2018  . Dementia (HCC)   . Hypertension   . PVD (peripheral vascular disease) (HCC)   . PVD (peripheral vascular disease) (HCC)      Assessment: Patient [age] yoM presenting with gangrenous toes in right foot. Pharmacy has been consulted for heparin dosing. Patient was previously on apixaban, but this was stopped during October 2019 admission and patient remains on clopidogrel.  Recent BKA. Per GI, started on IV heparin drip on 03/30/18 at low dose given history of chronic anemia and occult blood positive stool.    Heparin level still came back low at 0.23. MD just dc heparin and use ASA for PAD.    Goal of Therapy:  Heparin level 0.3-0.5 units/mL Monitor platelets by anticoagulation protocol: Yes   Plan:  De heparin and levels  Ulyses SouthwardMinh Melainie Krinsky, PharmD, BCIDP, AAHIVP, CPP Infectious Disease Pharmacist 04/02/2018 5:36 PM

## 2018-04-02 NOTE — Progress Notes (Signed)
Patient Demographics:    Muhamed Luecke, is a 67 y.o. male, DOB - [birth year], ZOX:[id number]  Admit date - 03/30/2018   Admitting Physician Alessandra Bevels, MD  Outpatient Primary MD for the patient is [facility]  LOS - 3   Chief Complaint  Patient presents with  . Blood Infection        Subjective:    [name] today has no fevers,    No chest pain, no vomiting no diarrhea, appears comfortable, able to feed himself, able to answer simple questions  Assessment  & Plan :    Active Problems:   Gangrene of foot (HCC)   Sepsis (HCC)   Palliative care by specialist   Goals of care, counseling/discussion   Acute GI bleeding   AKI (acute kidney injury) (HCC)  TTE 04/02/18 Impressions: - Cannot exclude small independently mobile lesions on the anterior mitral leaflet (ventricular aspect). Image quality is suboptimal to further assess.   Brief Summary  67 y.o. male with history h/o advanced dementia, hypertension, peripheral vascular disease who is well-known to vascular surgery service and was admitted last month for left foot ischemic ulcer requiring left BKA on March 01, 2018, readmitted 03/30/2018 with gangrene of the right foot leading to sepsis , at this time family has opted for palliative care and nonsurgical option, palliative care consult from 04/01/2018 appreciated, as of 04/02/2018 IV heparin discontinued Plavix restarted   Plan:-  1)MSSA Bacteremia/Sepsis--- blood cultures from 03/30/2018 with MSSA , most likely secondary to right foot gangrene, infectious disease input appreciated, vascular surgery consult from Dr. Myra Gianotti appreciated, at this time family is opting for palliative/nonsurgical treatment.   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 Ilsa Iha, will treat empirically for presumed MSSA endocarditis , does not desire TEE, c/n  IV Ancef 1 g every 8 hours for 6 weeks from first negative culture (04/01/18)  3)Chronic Anemia--- Hgb is stable at 8.2 which is close to his recent baseline,  on admission was concerned about dark stools/possible GI bleed, no further bleeding noted despite being on IV heparin for several days for PAD, GI consult from Dr. Levora Angel appreciated, no GI interventions planned, okay to switch Protonix to p.o.  4) FEN/Moderate Protein Caloric Malnutrition --- hypokalemia resolved with replacement, magnesium was normal, continue nutritional supplements  5)PAD--- restart Plavix as no surgical procedures are planned, continue aspirin,, right foot gangrene noted, stop IV heparin,  family declines surgical/Ortho intervention  6) Social/Ethics--plan of care discussed with patient granddaughter Ms Ismail Graziani  ([phone]) and patient's son Chuckie, [place]...Marland KitchenMarland KitchenMarland Kitchen granddaughter is primary contact, palliative care consult for GOC appreciated, family desires no surgical or heroic measures, but would would like medical management including IV antibiotics for now  7)AKI----acute kidney injury,     creatinine on admission= 2.42 ,   baseline creatinine =0.7    , creatinine is now=1.1, resolved AKI with hydration  renally adjust medications, avoid nephrotoxic agents / dehydration/hypotension  Disposition/Need for in-Hospital Stay-  patient unable to be discharged at this time due to sepsis sepsis requiring IV Ancef antibiotic,   Code Status : DNR  Disposition Plan  : --- SNF with palliative care and iv Ancef for 6 weeks from 04/01/18 (first negative culture)  Consults  : Palliative care/vascular surgery/infectious disease  DVT Prophylaxis  :  SQ  Heparin, avoid SCDs due to PAD with ischemic limb and status post BKA on the left  Lab Results  Component Value Date   PLT 491 (H) 04/02/2018    Inpatient Medications  Scheduled Meds: . sodium chloride   Intravenous Once  . aspirin EC  81 mg Oral Daily  . clopidogrel  75 mg Oral Daily  . feeding supplement (ENSURE ENLIVE)  237 mL Oral BID BM  . fluconazole  200 mg Oral Daily  . heparin injection (subcutaneous)  5,000 Units Subcutaneous Q8H  . mouth rinse  15 mL Mouth Rinse BID  . nutrition supplement (JUVEN)  1 packet Oral BID BM  . pantoprazole  40 mg Oral BID  . polyethylene glycol  17 g Oral Daily  . cyanocobalamin  1,000 mcg Oral Daily   Continuous Infusions: . sodium chloride Stopped (04/02/18 0649)  .  ceFAZolin (ANCEF) IV 200 mL/hr at 04/02/18 1500  . dextrose 5 % and 0.45 % NaCl with KCl 40 mEq/L 75 mL/hr at 04/02/18 1500   PRN Meds:.sodium chloride, acetaminophen, acetaminophen, LORazepam, methocarbamol, morphine injection, ondansetron **OR** ondansetron (ZOFRAN) IV, oxyCODONE    Anti-infectives (From admission, onward)   Start     Dose/Rate Route Frequency Ordered Stop   04/02/18 1400  ceFAZolin (ANCEF) IVPB 2g/100 mL premix     2 g 200 mL/hr over 30 Minutes Intravenous Every 8 hours 04/02/18 1105     04/01/18 1530  fluconazole (DIFLUCAN) tablet 200 mg     200 mg Oral Daily 04/01/18 1524 04/06/18 0959   03/31/18 1530  ceFAZolin (ANCEF) IVPB 1 g/50 mL premix  Status:  Discontinued     1 g 100 mL/hr over 30 Minutes Intravenous Every 8 hours 03/31/18 1443 04/02/18 1105   03/31/18 1000  ceFEPIme (MAXIPIME) 1 g in sodium chloride 0.9 % 100 mL IVPB  Status:  Discontinued     1 g 200 mL/hr over 30 Minutes Intravenous Every 24 hours 03/30/18 1525 03/31/18 1324   03/30/18 1600  vancomycin (VANCOCIN) IVPB 1000 mg/200 mL premix     1,000 mg 200 mL/hr over 60 Minutes Intravenous  Once 03/30/18 1525 03/30/18 2012   03/30/18 1145  cefTRIAXone (ROCEPHIN) 2 g in sodium  chloride 0.9 % 100 mL IVPB  Status:  Discontinued     2 g 200 mL/hr over 30 Minutes Intravenous Every 24 hours 03/30/18 1136 03/30/18 1525        Objective:   Vitals:   04/02/18 0044 04/02/18 0514 04/02/18 0935 04/02/18 1427  BP:  (!) 101/59  137/79  Pulse:  89  (!) 108  Resp:  (!) 24  18  Temp: 99.6 F (37.6 C) 97.6 F (36.4 C) 98.7 F (37.1 C) 97.7 F (36.5 C)  TempSrc: Oral Oral Axillary Oral  SpO2:  98%  100%  Weight:      Height:        Wt Readings from Last 3 Encounters:  03/30/18 49.8 kg  03/29/18 49.9 kg  03/08/18 56.9 kg     Intake/Output Summary (Last 24 hours) at 04/02/2018 1644 Last data filed at 04/02/2018 1500 Gross per 24 hour  Intake 3502.76 ml  Output 651 ml  Net 2851.76 ml     Physical Exam Patient is examined daily including today on 04/02/18 , exams remain the same as of yesterday except that has changed   Gen:- Awake , more awake  HEENT:- [place].AT, No sclera icterus Neck-Supple Neck,No JVD,.  Lungs-diminished in bases, no wheezing  CV- S1, S2 normal, regular  Abd-  +ve B.Sounds, Abd Soft, No tenderness,    Extremity/Skin:-Left BKA stump with intact staples, right foot gangrene noted, please see photo below Psych-affect is flat , oriented to self Neuro-generalized weakness, did not ambulate , no tremors Media Information   Document Information   Photos  Right foot 03/30/18  03/30/2018 14:18  Attached To:  Hospital Encounter on 03/30/18  Source Information   Rhyne, Ames Coupe, PA-C  [facility]      Data Review:   Micro Results Recent Results (from the past 240 hour(s))  Blood Culture (routine x 2)     Status: Abnormal   Collection Time: 03/30/18 12:06 PM  Result Value Ref Range Status   Specimen Description BLOOD RIGHT ANTECUBITAL  Final   Special Requests   Final    BOTTLES DRAWN AEROBIC AND ANAEROBIC Blood Culture adequate volume   Culture  Setup Time   Final    GRAM POSITIVE COCCI IN BOTH AEROBIC AND ANAEROBIC  BOTTLES CRITICAL VALUE NOTED.  VALUE IS CONSISTENT WITH PREVIOUSLY REPORTED AND CALLED VALUE.    Culture (A)  Final    STAPHYLOCOCCUS AUREUS SUSCEPTIBILITIES PERFORMED ON PREVIOUS CULTURE WITHIN THE LAST 5 DAYS. Performed at [facility], [address] [place], [place] [address]    Report Status 04/02/2018 FINAL  Final  Blood Culture (routine x 2)     Status: Abnormal   Collection Time: 03/30/18 12:15 PM  Result Value Ref Range Status   Specimen Description BLOOD BLOOD RIGHT ARM  Final   Special Requests   Final    BOTTLES DRAWN AEROBIC AND ANAEROBIC Blood Culture adequate volume   Culture  Setup Time   Final    GRAM POSITIVE COCCI IN BOTH AEROBIC AND ANAEROBIC BOTTLES CRITICAL RESULT CALLED TO, READ BACK BY AND VERIFIED WITHMelven Sartorius [facility] 4098 03/31/18 A BROWNING Performed at [facility], [address] [place], [place] [address]    Culture STAPHYLOCOCCUS AUREUS (A)  Final   Report Status 04/02/2018 FINAL  Final   Organism ID, Bacteria STAPHYLOCOCCUS AUREUS  Final      Susceptibility   Staphylococcus aureus - MIC*    CIPROFLOXACIN <=0.5 SENSITIVE Sensitive     ERYTHROMYCIN <=0.25 SENSITIVE Sensitive     GENTAMICIN <=0.5 SENSITIVE Sensitive     OXACILLIN 0.5 SENSITIVE Sensitive     TETRACYCLINE <=1 SENSITIVE Sensitive     VANCOMYCIN <=0.5 SENSITIVE Sensitive     TRIMETH/SULFA <=10 SENSITIVE Sensitive     CLINDAMYCIN <=0.25 SENSITIVE Sensitive     RIFAMPIN <=0.5 SENSITIVE Sensitive     Inducible Clindamycin NEGATIVE Sensitive     * STAPHYLOCOCCUS AUREUS  Blood Culture ID Panel (Reflexed)     Status: Abnormal   Collection Time: 03/30/18 12:15 PM  Result Value Ref Range Status   Enterococcus species NOT DETECTED NOT DETECTED Final   Listeria monocytogenes NOT DETECTED NOT DETECTED Final   Staphylococcus species DETECTED (A) NOT DETECTED Final    Comment: CRITICAL RESULT CALLED TO, READ BACK BY AND VERIFIED WITHShela Commons [facility] PHARMD 7829 03/31/18 A BROWNING      Staphylococcus aureus (BCID) DETECTED (A)  NOT DETECTED Final    Comment: Methicillin (oxacillin) susceptible Staphylococcus aureus (MSSA). Preferred therapy is anti staphylococcal beta lactam antibiotic (Cefazolin or Nafcillin), unless clinically contraindicated. CRITICAL RESULT CALLED TO, READ BACK BY AND VERIFIED WITHMelven Sartorius: [facility] [id number] 03/31/18 A BROWNING    Methicillin resistance NOT DETECTED NOT DETECTED Final   Streptococcus species NOT DETECTED NOT DETECTED Final   Streptococcus agalactiae NOT DETECTED NOT DETECTED Final   Streptococcus pneumoniae NOT DETECTED NOT DETECTED Final   Streptococcus pyogenes NOT DETECTED NOT DETECTED Final   Acinetobacter baumannii NOT DETECTED NOT DETECTED Final   Enterobacteriaceae species NOT DETECTED NOT DETECTED Final   Enterobacter cloacae complex NOT DETECTED NOT DETECTED Final   Escherichia coli NOT DETECTED NOT DETECTED Final   Klebsiella oxytoca NOT DETECTED NOT DETECTED Final   Klebsiella pneumoniae NOT DETECTED NOT DETECTED Final   Proteus species NOT DETECTED NOT DETECTED Final   Serratia marcescens NOT DETECTED NOT DETECTED Final   Haemophilus influenzae NOT DETECTED NOT DETECTED Final   Neisseria meningitidis NOT DETECTED NOT DETECTED Final   Pseudomonas aeruginosa NOT DETECTED NOT DETECTED Final   Candida albicans NOT DETECTED NOT DETECTED Final   Candida glabrata NOT DETECTED NOT DETECTED Final   Candida krusei NOT DETECTED NOT DETECTED Final   Candida parapsilosis NOT DETECTED NOT DETECTED Final   Candida tropicalis NOT DETECTED NOT DETECTED Final    Comment: Performed at [facility], [address] [place], [place] [address]  Culture, blood (routine x 2)     Status: None (Preliminary result)   Collection Time: 04/01/18  7:09 AM  Result Value Ref Range Status   Specimen Description BLOOD RIGHT ARM  Final   Special Requests   Final    BOTTLES DRAWN AEROBIC AND ANAEROBIC Blood Culture adequate volume   Culture   Final     NO GROWTH 1 DAY Performed at [facility], [address] [place], [place] [address]    Report Status PENDING  Incomplete  Culture, blood (routine x 2)     Status: None (Preliminary result)   Collection Time: 04/01/18  7:09 AM  Result Value Ref Range Status   Specimen Description BLOOD RIGHT ARM  Final   Special Requests   Final    BOTTLES DRAWN AEROBIC AND ANAEROBIC Blood Culture adequate volume   Culture   Final    NO GROWTH 1 DAY Performed at [facility], [address] [place], [place] [address]    Report Status PENDING  Incomplete    Radiology Reports Dg Chest Port 1 View  Result Date: 03/30/2018 CLINICAL DATA:  Hypotension. Possible sepsis. EXAM: PORTABLE CHEST 1 VIEW COMPARISON:  02/17/2018. FINDINGS: Normal sized heart. Mildly tortuous and calcified thoracic aorta. Clear lungs with normal vascularity. Bilateral AC joint degenerative changes. IMPRESSION: No acute abnormality. Electronically Signed   By: Beckie SaltsSteven  Reid M.D.   On: 03/30/2018 11:57     CBC Recent Labs  Lab 03/30/18 1206 03/30/18 2105 03/31/18 0210 04/01/18 0451 04/02/18 0631  WBC 26.0*  --  22.6* 19.5* 21.6*  HGB 8.3* 8.8* 8.8* 8.2* 8.9*  HCT 26.5* 25.6* 27.0* 25.7* 28.2*  PLT 482*  --  446* 389 491*  MCV 84.4  --  82.8 84.5 85.7  MCH 26.4  --  27.0 27.0 27.1  MCHC 31.3  --  32.6 31.9 31.6  RDW 17.0*  --  15.9* 16.6* 17.2*  LYMPHSABS 1.0  --   --   --   --  MONOABS 1.0  --   --   --   --   EOSABS 0.0  --   --   --   --   BASOSABS 0.1  --   --   --   --     Chemistries  Recent Labs  Lab 03/30/18 1206 03/31/18 0210 03/31/18 2105 04/01/18 0451  NA 136 138 141 141  K 3.6 2.5* 2.7* 3.6  CL 105 108 111 116*  CO2 17* 19* 19* 19*  GLUCOSE 200* 167* 113* 141*  BUN 80* 46* 23 22  CREATININE 2.42* 1.37* 1.16 1.12  CALCIUM 8.9 8.4* 8.2* 8.2*  MG  --  2.2  --   --   AST 129*  --   --  110*  ALT 124*  --   --  89*  ALKPHOS 246*  --   --  176*  BILITOT 0.5  --   --  1.3*    Coagulation profile Recent Labs  Lab 03/30/18 1220  INR 1.46   Shon Hale M.D on 04/02/2018 at 4:44 PM  Pager---[phone] Go to [email or web address] - password [email or web address] for contact info  [facility] - Office  [phone]

## 2018-04-02 NOTE — Progress Notes (Signed)
Initial Nutrition Assessment  DOCUMENTATION CODES:   Severe malnutrition in context of chronic illness, Underweight  INTERVENTION:   - Continue Ensure Enlive po BID, each supplement provides 350 kcal and 20 grams of protein  - 1 packet Juven BID, each packet provides 80 calories, 8 grams of carbohydrate, and 14 grams of amino acids; supplement contains CaHMB, glutamine, and arginine, to promote wound healing  NUTRITION DIAGNOSIS:   Severe Malnutrition related to chronic illness (dementia) as evidenced by severe fat depletion, severe muscle depletion.  GOAL:   Patient will meet greater than or equal to 90% of their needs  MONITOR:   PO intake, Supplement acceptance, Labs, Weight trends, Skin  REASON FOR ASSESSMENT:   Other (underweight BMI)    ASSESSMENT:   67 year old male who presented to the ED on 11/22 with concerns for sepsis. [facility]. 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 Pt found to have gangrene of the right foot and GIB.  Palliative care team is following this pt. Family has decided against surgery. Per note on 11/24, plan is for continued medical management with IV antibiotics. Pt with overall poor prognosis.  Discussed pt with RN who reports pt did not want to eat lunch.  Spoke with pt and cousin at bedside. Noted unfinished lunch tray at bedside. Pt had taken a few bites of each food on the plate. Also noted unfinished Ensure Enlive. Provided pt with a cup of ice so that he could finish it.  Pt states that he has not had an appetite for the past 1 year. Pt reports typically eating 2 meals daily but is unsure what he may have at a meal, stating "it depends on what it is." Pt endorses weight loss but is unsure of amount. Weight history in chart is limited and only goes back 1 month PTA. Pt with weight loss; however, suspect this is related to s/p BKA on 10/25.  Medications reviewed and include: Ensure Enlive BID,  Protonix, Miralax, vitamin B-12, IV Ancef, D5 and NaCl w/ KCl 40 mEq/L @ 75 ml/hr  Labs reviewed: elevated LFTs  UOP: 1300 ml x 24 hours I/O's: +8.6 L since admit  NUTRITION - FOCUSED PHYSICAL EXAM:    Most Recent Value  Orbital Region  Severe depletion  Upper Arm Region  Severe depletion  Thoracic and Lumbar Region  Severe depletion  Buccal Region  Severe depletion  Temple Region  Severe depletion  Clavicle Bone Region  Severe depletion  Clavicle and Acromion Bone Region  Severe depletion  Scapular Bone Region  Severe depletion  Dorsal Hand  Moderate depletion  Patellar Region  Severe depletion  Anterior Thigh Region  Severe depletion  Posterior Calf Region  Severe depletion  Edema (RD Assessment)  None  Hair  Reviewed  Eyes  Reviewed  Mouth  Reviewed  Skin  Reviewed  Nails  Reviewed       Diet Order:   Diet Order            DIET DYS 2 Room service appropriate? Yes; Fluid consistency: Thin  Diet effective now              EDUCATION NEEDS:   Not appropriate for education at this time  Skin:  Skin Assessment: Skin Integrity Issues: Incisions: left leg (s/p BKA) Other: necrotic/gangrenous tissue to right foot, skin tear to sacrum  Last BM:  11/24 (small type 1)  Height:   Ht Readings from Last 1 Encounters:  03/30/18 5\' 10"  (1.778 m)    Weight:   Wt Readings from Last 1 Encounters:  03/30/18 49.8 kg    Ideal Body Weight:  70.55 kg (adjusted for BKA)  BMI:  Body mass index is 15.75 kg/m.  Estimated Nutritional Needs:   Kcal:  1500-1700  Protein:  75-85 grams  Fluid:  1.5-1.7 L    Earma Reading, MS, RD, LDN Inpatient Clinical Dietitian Pager: [phone] Weekend/After Hours: [phone]

## 2018-04-02 NOTE — Progress Notes (Signed)
[facility] Coordinator following  pt with ID team to support home Infusion Pharmacy services at [place] if needed,  If patient discharges after hours, please call [phone].   Sedalia Mutaamela S Chandler 04/02/2018, 7:50 AM

## 2018-04-02 NOTE — Progress Notes (Signed)
[facility]    Date of Admission:  03/30/2018   Total days of antibiotics 4        Day 3 cefazolin  ID: Malik Stanton is a 67 y.o. 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  Had TTE suggestive of vegetation  Medications:  . sodium chloride   Intravenous Once  . aspirin EC  81 mg Oral Daily  . clopidogrel  75 mg Oral Daily  . feeding supplement (ENSURE ENLIVE)  237 mL Oral BID BM  . fluconazole  200 mg Oral Daily  . heparin injection (subcutaneous)  5,000 Units Subcutaneous Q8H  . mouth rinse  15 mL Mouth Rinse BID  . nutrition supplement (JUVEN)  1 packet Oral BID BM  . pantoprazole  40 mg Oral BID  . polyethylene glycol  17 g Oral Daily  . cyanocobalamin  1,000 mcg Oral Daily    Objective: Vital signs in last 24 hours: Temp:  [97.6 F (36.4 C)-102.3 F (39.1 C)] 97.7 F (36.5 C) (11/25 1427) Pulse Rate:  [89-109] 108 (11/25 1427) Resp:  [18-28] 18 (11/25 1427) BP: (101-137)/(59-79) 137/79 (11/25 1427) SpO2:  [97 %-100 %] 100 % (11/25 1427) Physical Exam  Constitutional: He appears older than stated age and mal-nourished. No distress.  HENT:  Mouth/Throat: Oropharynx is dry.  Cardiovascular: Normal rate, regular rhythm and normal heart sounds. Exam reveals no gallop and no friction rub.  No murmur heard.  Pulmonary/Chest: Effort normal and breath sounds normal. No respiratory distress. He has no wheezes.  Abdominal: Soft. Bowel sounds are normal. He exhibits no distension. There is no tenderness.  Ext: right foot deformity due to dry gangrene, and left bka has staples in place, well healed incision Skin: Skin  of right foot is black.   Lab Results Recent Labs    03/31/18 2105 04/01/18 0451 04/02/18 0631  WBC  --  19.5* 21.6*  HGB  --  8.2* 8.9*  HCT  --  25.7* 28.2*  NA 141 141  --   K 2.7* 3.6  --   CL 111 116*  --   CO2 19* 19*  --   BUN 23 22  --   CREATININE 1.16 1.12  --    Liver Panel Recent Labs    04/01/18 0451  PROT 5.4*  ALBUMIN 1.5*  AST 110*  ALT 89*  ALKPHOS 176*  BILITOT 1.3*    Microbiology: 11/22 blood cx mssa 11/24 blood cx ngtd at 24hr Studies/Results: No results found. ------------------------------------------------------------------- Study Conclusions  - Study data: Respiratory motion and tachycardia make image quality   suboptimal for the diagnosis of small valvular vegetations. - Left ventricle: The cavity size was normal. Systolic function was   vigorous. The estimated ejection fraction was in the range of 65%   to 70%. Although no diagnostic regional wall motion abnormality   was identified, this possibility cannot be completely excluded on   the basis of this study. Indeterminate diastolic function. - Aortic valve: Transvalvular velocity was within the normal range.   There was no stenosis. There was no regurgitation. - Mitral valve:  Cannot exclude independently mobile lesion on the   anterior mitral leaflet, ventricular aspect. There was no   regurgitation. - Left atrium: The atrium was normal in size. - Right ventricle: The cavity size was normal. Wall thickness was   normal. Systolic function was normal. - Tricuspid valve: There was trivial regurgitation. - Inferior vena cava: The vessel was normal in size. The   respirophasic diameter changes were in the normal range (>= 50%),   consistent with normal central venous pressure.  Impressions:  - Cannot exclude small independently mobile lesions on the anterior   mitral leaflet (ventricular aspect). Image quality is suboptimal   to further assess.  Assessment/Plan: MSSA  bacteremia with probable MV endocarditis = will plan to treat for 6 wk with cefazolin 2gm IV Q 8hr. Family does not want to pursue further testing such as TEE. His source of infection is probably his right gangrenous foot which family do not want further intervention.   If blood cx ngtd todate tomorrow, can place picc line for Iv abtx if this is the course that the family agrees upon.  Fever = concern that this is due to his gangrenous foot and that we do not have source control over his infection. Even though he is on appropriate abtx, he still may succumb to overall infection from necrosis of tissue of right extremity  Left bka = ask primary team when his sutures to be removed. Incision appears well healed  Severe protein caloric malnutrition =encouraged patient and family to eat  Overall outcome is poor given other aspects of health (gangrenous wound of right foot)   Will sign off  Will place opat orders in  [facility] Cell: [phone] Pager: [phone]  04/02/2018, 6:58 PM

## 2018-04-03 ENCOUNTER — Inpatient Hospital Stay: Payer: Self-pay

## 2018-04-03 DIAGNOSIS — E43 Unspecified severe protein-calorie malnutrition: Secondary | ICD-10-CM

## 2018-04-03 DIAGNOSIS — I330 Acute and subacute infective endocarditis: Secondary | ICD-10-CM

## 2018-04-03 DIAGNOSIS — I38 Endocarditis, valve unspecified: Secondary | ICD-10-CM

## 2018-04-03 LAB — TYPE AND SCREEN
ABO/RH(D): A POS
ANTIBODY SCREEN: NEGATIVE
Unit division: 0
Unit division: 0

## 2018-04-03 LAB — BPAM RBC
Blood Product Expiration Date: 201912132359
Blood Product Expiration Date: 201912162359
ISSUE DATE / TIME: 201911221400
UNIT TYPE AND RH: 6200
Unit Type and Rh: 6200

## 2018-04-03 LAB — CBC
HEMATOCRIT: 27.7 % — AB (ref 39.0–52.0)
Hemoglobin: 8.8 g/dL — ABNORMAL LOW (ref 13.0–17.0)
MCH: 27 pg (ref 26.0–34.0)
MCHC: 31.8 g/dL (ref 30.0–36.0)
MCV: 85 fL (ref 80.0–100.0)
Platelets: 466 10*3/uL — ABNORMAL HIGH (ref 150–400)
RBC: 3.26 MIL/uL — ABNORMAL LOW (ref 4.22–5.81)
RDW: 17.4 % — ABNORMAL HIGH (ref 11.5–15.5)
WBC: 18 10*3/uL — ABNORMAL HIGH (ref 4.0–10.5)
nRBC: 0 % (ref 0.0–0.2)

## 2018-04-03 LAB — BASIC METABOLIC PANEL
Anion gap: 7 (ref 5–15)
BUN: 12 mg/dL (ref 8–23)
CALCIUM: 8.4 mg/dL — AB (ref 8.9–10.3)
CHLORIDE: 112 mmol/L — AB (ref 98–111)
CO2: 20 mmol/L — AB (ref 22–32)
CREATININE: 0.98 mg/dL (ref 0.61–1.24)
GFR calc Af Amer: >60 mL/min (ref >60)
GFR calc non Af Amer: >60 mL/min (ref >60)
GLUCOSE: 122 mg/dL — AB (ref 70–99)
Potassium: 3.6 mmol/L (ref 3.5–5.1)
Sodium: 139 mmol/L (ref 135–145)

## 2018-04-03 LAB — GLUCOSE, CAPILLARY
GLUCOSE-CAPILLARY: 110 mg/dL — AB (ref 70–99)
GLUCOSE-CAPILLARY: 102 mg/dL — AB (ref 70–99)
Glucose-Capillary: 104 mg/dL — ABNORMAL HIGH (ref 70–99)
Glucose-Capillary: 105 mg/dL — ABNORMAL HIGH (ref 70–99)
Glucose-Capillary: 121 mg/dL — ABNORMAL HIGH (ref 70–99)
Glucose-Capillary: 123 mg/dL — ABNORMAL HIGH (ref 70–99)
Glucose-Capillary: 90 mg/dL (ref 70–99)

## 2018-04-03 MED ORDER — MUPIROCIN 2 % EX OINT: 1.0000 "application " | TOPICAL_OINTMENT | Freq: Two times a day (BID) | CUTANEOUS | Status: DC

## 2018-04-03 NOTE — Progress Notes (Signed)
Patient Demographics:    Malik Stanton, is a 67 y.o. male, DOB - 1951-04-29, WJX:[id number]  Admit date - 03/30/2018   Admitting Physician Alessandra Bevels, MD  Outpatient Primary MD for the patient is [facility]  LOS - 4   Chief Complaint  Patient presents with  . Blood Infection        Subjective:    [name] today has no fevers,    No chest pain, able to answer simple questions denies any significant pain, says he wants right BKA  Assessment  & Plan :    Active Problems:   Gangrene of foot (HCC)   Sepsis (HCC)   Palliative care by specialist   Goals of care, counseling/discussion   Acute GI bleeding   AKI (acute kidney injury) (HCC)   Endocarditis   Cellulitis of right lower extremity   Protein-calorie malnutrition, severe  TTE 04/02/18 Impressions: - Cannot exclude small independently mobile lesions on the anterior mitral leaflet (ventricular aspect). Image quality is suboptimal to further assess.   Brief Summary  67 y.o. male with history h/o advanced dementia, hypertension, peripheral vascular disease who is well-known to vascular surgery service and was admitted last month for left foot ischemic ulcer requiring left BKA on March 01, 2018, readmitted 03/30/2018 with gangrene of the right foot leading to sepsis , initially family has opted for palliative care and nonsurgical option, patient and family have changed their mind, plan is for right BKA on 04/04/2018  Plan:-  1)MSSA Bacteremia/Sepsis--- blood cultures from 03/30/2018 with MSSA , most likely secondary to right foot gangrene, infectious disease input appreciated, vascular surgery consult from Dr. Myra Gianotti appreciated,   white count is down to 18k  from 26K... Please see #2 below  2)Presumed MSSA endocarditis-TTE from 04/02/2018 noted as above, discussed with ID physician Dr. Ilsa Iha, will treat empirically  for presumed MSSA endocarditis , family does not desire TEE, c/n  IV Ancef 2 g every 8 hours for 6 weeks from first negative culture (04/01/18).... Please see #1 above  3)Chronic Anemia--- Hgb is stable at 8.8 which is close to his recent baseline,  on admission was concerned about dark stools/possible GI bleed, no further bleeding noted despite being on IV heparin for several days for PAD, GI consult from Dr. Levora Angel appreciated, no GI interventions planned, continue oral Protonix  4) FEN/Moderate Protein Caloric Malnutrition --- hypokalemia resolved with replacement, magnesium was normal, continue nutritional supplements  5)PAD--- postop we will continue  aspirin and Plavix,, right foot gangrene noted, , for right BKA on 04/04/2018  6) Social/Ethics--patient is DNR/DNI, plan of care discussed with patient granddaughter [name]  ([phone]) and patient's son [name], [name]...Marland KitchenMarland KitchenMarland Kitchen granddaughter is primary contact, palliative care consult for GOC appreciated, family initially declined right BKA, patient is more awake now after further conversations with patient and granddaughter plan is for right BKA on 04/04/2018    7)AKI----acute kidney injury,     creatinine on admission= 2.42 ,   baseline creatinine =0.7    , creatinine is now=0.98, resolved AKI with hydration  renally adjust medications, avoid nephrotoxic agents / dehydration/hypotension  Disposition/Need for in-Hospital Stay- patient unable to be discharged at this time due to sepsis sepsis requiring IV Ancef antibiotic,  And plan for Rt BKA on 04/04/18  Code Status : DNR  Disposition Plan  : --- SNF with palliative care and iv Ancef for 6 weeks from 04/01/18 (first negative blood culture)  Consults  : Palliative care/vascular surgery/infectious disease  DVT Prophylaxis  :  SQ Heparin, avoid SCDs due to PAD with ischemic limb and status post BKA on the left  Lab Results  Component Value Date   PLT 466 (H) 04/03/2018     Inpatient Medications  Scheduled Meds: . sodium chloride   Intravenous Once  . aspirin EC  81 mg Oral Daily  . clopidogrel  75 mg Oral Daily  . feeding supplement (ENSURE ENLIVE)  237 mL Oral BID BM  . fluconazole  200 mg Oral Daily  . heparin injection (subcutaneous)  5,000 Units Subcutaneous Q8H  . mouth rinse  15 mL Mouth Rinse BID  . nutrition supplement (JUVEN)  1 packet Oral BID BM  . pantoprazole  40 mg Oral BID  . polyethylene glycol  17 g Oral Daily  . cyanocobalamin  1,000 mcg Oral Daily   Continuous Infusions: . sodium chloride Stopped (04/02/18 0649)  .  ceFAZolin (ANCEF) IV 2 g (04/03/18 1440)  . dextrose 5 % and 0.45 % NaCl with KCl 40 mEq/L 75 mL/hr at 04/03/18 1619   PRN Meds:.sodium chloride, acetaminophen, acetaminophen, LORazepam, methocarbamol, morphine injection, ondansetron **OR** ondansetron (ZOFRAN) IV, oxyCODONE    Anti-infectives (From admission, onward)   Start     Dose/Rate Route Frequency Ordered Stop   04/02/18 1400  ceFAZolin (ANCEF) IVPB 2g/100 mL premix     2 g 200 mL/hr over 30 Minutes Intravenous Every 8 hours 04/02/18 1105 05/13/18 2359   04/01/18 1530  fluconazole (DIFLUCAN) tablet 200 mg     200 mg Oral Daily 04/01/18 1524 04/06/18 0959   03/31/18 1530  ceFAZolin (ANCEF) IVPB 1 g/50 mL premix  Status:  Discontinued     1 g 100 mL/hr over 30 Minutes Intravenous Every 8 hours 03/31/18 1443 04/02/18 1105   03/31/18 1000  ceFEPIme (MAXIPIME) 1 g in sodium chloride 0.9 % 100 mL IVPB  Status:  Discontinued     1 g 200 mL/hr over 30 Minutes Intravenous Every 24 hours 03/30/18 1525 03/31/18 1324   03/30/18 1600  vancomycin (VANCOCIN) IVPB 1000 mg/200 mL premix     1,000 mg 200 mL/hr over 60 Minutes Intravenous  Once 03/30/18 1525 03/30/18 2012   03/30/18 1145  cefTRIAXone (ROCEPHIN) 2 g in sodium chloride 0.9 % 100 mL IVPB  Status:  Discontinued     2 g 200 mL/hr over 30 Minutes Intravenous Every 24 hours 03/30/18 1136 03/30/18 1525         Objective:   Vitals:   04/02/18 1427 04/02/18 2208 04/03/18 0611 04/03/18 1522  BP: 137/79 (!) 147/77 119/81 (!) 120/95  Pulse: (!) 108 (!) 115 97 95  Resp: 18 16 17 18   Temp: 97.7 F (36.5 C) 99.6 F (37.6 C) 97.9 F (36.6 C) 98.2 F (36.8 C)  TempSrc: Oral Oral  Oral  SpO2: 100% 97%  100%  Weight:      Height:        Wt Readings from Last 3 Encounters:  03/30/18 49.8 kg  03/29/18 49.9 kg  03/08/18 56.9 kg     Intake/Output Summary (Last 24 hours) at 04/03/2018 1951 Last data filed at 04/02/2018 2055 Gross per 24 hour  Intake -  Output 120 ml  Net -120 ml  Physical Exam Patient is examined daily including today on 04/03/18 , exams remain the same as of yesterday except that has changed   Gen:- Awake , able to follow commands, talking and eating HEENT:- Overly.AT, No sclera icterus Neck-Supple Neck,No JVD,.  Lungs-improving air movement no wheezing  CV- S1, S2 normal, regular  Abd-  +ve B.Sounds, Abd Soft, No tenderness,    Extremity/Skin:-Left BKA stump with intact staples, right foot gangrene noted, please see photo below Psych-affect is flat , oriented to self Neuro-generalized weakness, did not ambulate , no tremors Media Information   Document Information   Photos  Right foot 03/30/18  03/30/2018 14:18  Attached To:  Hospital Encounter on 03/30/18  Source Information   Rhyne, Ames CoupeSamantha J, PA-C  [facility]      Data Review:   Micro Results Recent Results (from the past 240 hour(s))  Blood Culture (routine x 2)     Status: Abnormal   Collection Time: 03/30/18 12:06 PM  Result Value Ref Range Status   Specimen Description BLOOD RIGHT ANTECUBITAL  Final   Special Requests   Final    BOTTLES DRAWN AEROBIC AND ANAEROBIC Blood Culture adequate volume   Culture  Setup Time   Final    GRAM POSITIVE COCCI IN BOTH AEROBIC AND ANAEROBIC BOTTLES CRITICAL VALUE NOTED.  VALUE IS CONSISTENT WITH PREVIOUSLY REPORTED AND CALLED VALUE.     Culture (A)  Final    STAPHYLOCOCCUS AUREUS SUSCEPTIBILITIES PERFORMED ON PREVIOUS CULTURE WITHIN THE LAST 5 DAYS. Performed at [facility], [address] [place], [place] [address]    Report Status 04/02/2018 FINAL  Final  Blood Culture (routine x 2)     Status: Abnormal   Collection Time: 03/30/18 12:15 PM  Result Value Ref Range Status   Specimen Description BLOOD BLOOD RIGHT ARM  Final   Special Requests   Final    BOTTLES DRAWN AEROBIC AND ANAEROBIC Blood Culture adequate volume   Culture  Setup Time   Final    GRAM POSITIVE COCCI IN BOTH AEROBIC AND ANAEROBIC BOTTLES CRITICAL RESULT CALLED TO, READ BACK BY AND VERIFIED WITHMelven Sartorius: J [facility] [id number] 03/31/18 A BROWNING Performed at [facility], [address] [place], [place] [address]    Culture STAPHYLOCOCCUS AUREUS (A)  Final   Report Status 04/02/2018 FINAL  Final   Organism ID, Bacteria STAPHYLOCOCCUS AUREUS  Final      Susceptibility   Staphylococcus aureus - MIC*    CIPROFLOXACIN <=0.5 SENSITIVE Sensitive     ERYTHROMYCIN <=0.25 SENSITIVE Sensitive     GENTAMICIN <=0.5 SENSITIVE Sensitive     OXACILLIN 0.5 SENSITIVE Sensitive     TETRACYCLINE <=1 SENSITIVE Sensitive     VANCOMYCIN <=0.5 SENSITIVE Sensitive     TRIMETH/SULFA <=10 SENSITIVE Sensitive     CLINDAMYCIN <=0.25 SENSITIVE Sensitive     RIFAMPIN <=0.5 SENSITIVE Sensitive     Inducible Clindamycin NEGATIVE Sensitive     * STAPHYLOCOCCUS AUREUS  Blood Culture ID Panel (Reflexed)     Status: Abnormal   Collection Time: 03/30/18 12:15 PM  Result Value Ref Range Status   Enterococcus species NOT DETECTED NOT DETECTED Final   Listeria monocytogenes NOT DETECTED NOT DETECTED Final   Staphylococcus species DETECTED (A) NOT DETECTED Final    Comment: CRITICAL RESULT CALLED TO, READ BACK BY AND VERIFIED WITHShela Commons: J [facility] PHARMD [id number] 03/31/18 A BROWNING    Staphylococcus aureus (BCID) DETECTED (A) NOT DETECTED Final    Comment: Methicillin (oxacillin)  susceptible Staphylococcus aureus (MSSA). Preferred therapy is anti staphylococcal beta lactam antibiotic (Cefazolin or Nafcillin), unless clinically contraindicated. CRITICAL RESULT CALLED TO, READ BACK BY AND VERIFIED WITHMelven Sartorius [facility] [id number] 03/31/18 A BROWNING    Methicillin resistance NOT DETECTED NOT DETECTED Final   Streptococcus species NOT DETECTED NOT DETECTED Final   Streptococcus agalactiae NOT DETECTED NOT DETECTED Final   Streptococcus pneumoniae NOT DETECTED NOT DETECTED Final   Streptococcus pyogenes NOT DETECTED NOT DETECTED Final   Acinetobacter baumannii NOT DETECTED NOT DETECTED Final   Enterobacteriaceae species NOT DETECTED NOT DETECTED Final   Enterobacter cloacae complex NOT DETECTED NOT DETECTED Final   Escherichia coli NOT DETECTED NOT DETECTED Final   Klebsiella oxytoca NOT DETECTED NOT DETECTED Final   Klebsiella pneumoniae NOT DETECTED NOT DETECTED Final   Proteus species NOT DETECTED NOT DETECTED Final   Serratia marcescens NOT DETECTED NOT DETECTED Final   Haemophilus influenzae NOT DETECTED NOT DETECTED Final   Neisseria meningitidis NOT DETECTED NOT DETECTED Final   Pseudomonas aeruginosa NOT DETECTED NOT DETECTED Final   Candida albicans NOT DETECTED NOT DETECTED Final   Candida glabrata NOT DETECTED NOT DETECTED Final   Candida krusei NOT DETECTED NOT DETECTED Final   Candida parapsilosis NOT DETECTED NOT DETECTED Final   Candida tropicalis NOT DETECTED NOT DETECTED Final    Comment: Performed at [facility], [address] [place], [place] [address]  Culture, blood (routine x 2)     Status: None (Preliminary result)   Collection Time: 04/01/18  7:09 AM  Result Value Ref Range Status   Specimen Description BLOOD RIGHT ARM  Final   Special Requests   Final    BOTTLES DRAWN AEROBIC AND ANAEROBIC Blood Culture adequate volume   Culture   Final    NO GROWTH 2 DAYS Performed at [facility], [address] [place], [place] [address]      Report Status PENDING  Incomplete  Culture, blood (routine x 2)     Status: None (Preliminary result)   Collection Time: 04/01/18  7:09 AM  Result Value Ref Range Status   Specimen Description BLOOD RIGHT ARM  Final   Special Requests   Final    BOTTLES DRAWN AEROBIC AND ANAEROBIC Blood Culture adequate volume   Culture   Final    NO GROWTH 2 DAYS Performed at [facility], [address], [place], [place] [address]    Report Status PENDING  Incomplete    Radiology Reports Dg Chest Port 1 View  Result Date: 03/30/2018 CLINICAL DATA:  Hypotension. Possible sepsis. EXAM: PORTABLE CHEST 1 VIEW COMPARISON:  02/17/2018. FINDINGS: Normal sized heart. Mildly tortuous and calcified thoracic aorta. Clear lungs with normal vascularity. Bilateral AC joint degenerative changes. IMPRESSION: No acute abnormality. Electronically Signed   By: Beckie Salts M.D.   On: 03/30/2018 11:57   [place] Ekg Site Rite  Result Date: 04/03/2018 If Site Rite image not attached, placement could not be confirmed due to current cardiac rhythm.    CBC Recent Labs  Lab 03/30/18 1206 03/30/18 2105 03/31/18 0210 04/01/18 0451 04/02/18 0631 04/03/18 0451  WBC 26.0*  --  22.6* 19.5* 21.6* 18.0*  HGB 8.3* 8.8* 8.8* 8.2* 8.9* 8.8*  HCT 26.5* 25.6* 27.0* 25.7* 28.2* 27.7*  PLT 482*  --  446* 389 491* 466*  MCV 84.4  --  82.8 84.5 85.7 85.0  MCH 26.4  --  27.0 27.0 27.1 27.0  MCHC 31.3  --  32.6  31.9 31.6 31.8  RDW 17.0*  --  15.9* 16.6* 17.2* 17.4*  LYMPHSABS 1.0  --   --   --   --   --   MONOABS 1.0  --   --   --   --   --   EOSABS 0.0  --   --   --   --   --   BASOSABS 0.1  --   --   --   --   --     Chemistries  Recent Labs  Lab 03/30/18 1206 03/31/18 0210 03/31/18 2105 04/01/18 0451 04/03/18 0451  NA 136 138 141 141 139  K 3.6 2.5* 2.7* 3.6 3.6  CL 105 108 111 116* 112*  CO2 17* 19* 19* 19* 20*  GLUCOSE 200* 167* 113* 141* 122*  BUN 80* 46* 23 22 12   CREATININE 2.42* 1.37* 1.16 1.12 0.98   CALCIUM 8.9 8.4* 8.2* 8.2* 8.4*  MG  --  2.2  --   --   --   AST 129*  --   --  110*  --   ALT 124*  --   --  89*  --   ALKPHOS 246*  --   --  176*  --   BILITOT 0.5  --   --  1.3*  --    Coagulation profile Recent Labs  Lab 03/30/18 1220  INR 1.46   Shon Hale M.D on 04/03/2018 at 7:51 PM  Pager---[phone] Go to [email or web address] - password [email or web address] for contact info  [facility] - Office  [phone]

## 2018-04-03 NOTE — H&P (View-Only) (Signed)
    Subjective  -   No complaints this am   Physical Exam:  Ischemic, non-viable right elg Non labored breathing abd soft       Assessment/Plan:    Patient more lucid today.  He states that he wants to proceed with right leg amputation.  All questions answered  Malik Stanton 04/03/2018 11:14 PM --  Vitals:   04/03/18 1522 04/03/18 2040  BP: (!) 120/95 124/80  Pulse: 95 (!) 113  Resp: 18 16  Temp: 98.2 F (36.8 C) 98.4 F (36.9 C)  SpO2: 100% 98%   No intake or output data in the 24 hours ending 04/03/18 2314   Laboratory CBC    Component Value Date/Time   WBC 18.0 (H) 04/03/2018 0451   HGB 8.8 (L) 04/03/2018 0451   HCT 27.7 (L) 04/03/2018 0451   PLT 466 (H) 04/03/2018 0451    BMET    Component Value Date/Time   NA 139 04/03/2018 0451   K 3.6 04/03/2018 0451   CL 112 (H) 04/03/2018 0451   CO2 20 (L) 04/03/2018 0451   GLUCOSE 122 (H) 04/03/2018 0451   BUN 12 04/03/2018 0451   CREATININE 0.98 04/03/2018 0451   CALCIUM 8.4 (L) 04/03/2018 0451   GFRNONAA >60 04/03/2018 0451   GFRAA >60 04/03/2018 0451    COAG Lab Results  Component Value Date   INR 1.46 03/30/2018   INR 1.27 02/20/2018   INR 1.33 02/17/2018   No results found for: PTT  Antibiotics Anti-infectives (From admission, onward)   Start     Dose/Rate Route Frequency Ordered Stop   04/02/18 1400  ceFAZolin (ANCEF) IVPB 2g/100 mL premix     2 g 200 mL/hr over 30 Minutes Intravenous Every 8 hours 04/02/18 1105 05/13/18 2359   04/01/18 1530  fluconazole (DIFLUCAN) tablet 200 mg     200 mg Oral Daily 04/01/18 1524 04/06/18 0959   03/31/18 1530  ceFAZolin (ANCEF) IVPB 1 g/50 mL premix  Status:  Discontinued     1 g 100 mL/hr over 30 Minutes Intravenous Every 8 hours 03/31/18 1443 04/02/18 1105   03/31/18 1000  ceFEPIme (MAXIPIME) 1 g in sodium chloride 0.9 % 100 mL IVPB  Status:  Discontinued     1 g 200 mL/hr over 30 Minutes Intravenous Every 24 hours 03/30/18 1525 03/31/18 1324   03/30/18 1600  vancomycin (VANCOCIN) IVPB 1000 mg/200 mL premix     1,000 mg 200 mL/hr over 60 Minutes Intravenous  Once 03/30/18 1525 03/30/18 2012   03/30/18 1145  cefTRIAXone (ROCEPHIN) 2 g in sodium chloride 0.9 % 100 mL IVPB  Status:  Discontinued     2 g 200 mL/hr over 30 Minutes Intravenous Every 24 hours 03/30/18 1136 03/30/18 1525       V. Malik Stanton IV, M.D. [facility] Office: [phone] Pager:  [phone] 

## 2018-04-03 NOTE — Progress Notes (Signed)
    Subjective  -   No complaints this am   Physical Exam:  Ischemic, non-viable right elg Non labored breathing abd soft       Assessment/Plan:    Patient more lucid today.  He states that he wants to proceed with right leg amputation.  All questions answered  Malik Stanton 04/03/2018 11:14 PM --  Vitals:   04/03/18 1522 04/03/18 2040  BP: (!) 120/95 124/80  Pulse: 95 (!) 113  Resp: 18 16  Temp: 98.2 F (36.8 C) 98.4 F (36.9 C)  SpO2: 100% 98%   No intake or output data in the 24 hours ending 04/03/18 2314   Laboratory CBC    Component Value Date/Time   WBC 18.0 (H) 04/03/2018 0451   HGB 8.8 (L) 04/03/2018 0451   HCT 27.7 (L) 04/03/2018 0451   PLT 466 (H) 04/03/2018 0451    BMET    Component Value Date/Time   NA 139 04/03/2018 0451   K 3.6 04/03/2018 0451   CL 112 (H) 04/03/2018 0451   CO2 20 (L) 04/03/2018 0451   GLUCOSE 122 (H) 04/03/2018 0451   BUN 12 04/03/2018 0451   CREATININE 0.98 04/03/2018 0451   CALCIUM 8.4 (L) 04/03/2018 0451   GFRNONAA >60 04/03/2018 0451   GFRAA >60 04/03/2018 0451    COAG Lab Results  Component Value Date   INR 1.46 03/30/2018   INR 1.27 02/20/2018   INR 1.33 02/17/2018   No results found for: PTT  Antibiotics Anti-infectives (From admission, onward)   Start     Dose/Rate Route Frequency Ordered Stop   04/02/18 1400  ceFAZolin (ANCEF) IVPB 2g/100 mL premix     2 g 200 mL/hr over 30 Minutes Intravenous Every 8 hours 04/02/18 1105 05/13/18 2359   04/01/18 1530  fluconazole (DIFLUCAN) tablet 200 mg     200 mg Oral Daily 04/01/18 1524 04/06/18 0959   03/31/18 1530  ceFAZolin (ANCEF) IVPB 1 g/50 mL premix  Status:  Discontinued     1 g 100 mL/hr over 30 Minutes Intravenous Every 8 hours 03/31/18 1443 04/02/18 1105   03/31/18 1000  ceFEPIme (MAXIPIME) 1 g in sodium chloride 0.9 % 100 mL IVPB  Status:  Discontinued     1 g 200 mL/hr over 30 Minutes Intravenous Every 24 hours 03/30/18 1525 03/31/18 1324   03/30/18 1600  vancomycin (VANCOCIN) IVPB 1000 mg/200 mL premix     1,000 mg 200 mL/hr over 60 Minutes Intravenous  Once 03/30/18 1525 03/30/18 2012   03/30/18 1145  cefTRIAXone (ROCEPHIN) 2 g in sodium chloride 0.9 % 100 mL IVPB  Status:  Discontinued     2 g 200 mL/hr over 30 Minutes Intravenous Every 24 hours 03/30/18 1136 03/30/18 1525       V. Charlena CrossWells Stanton IV, M.D. [facility] [place] Office: [phone] Pager:  [phone]

## 2018-04-03 NOTE — Progress Notes (Signed)
Daily Progress Note   Patient Name: Malik Stanton       Date: 04/03/2018 DOB: [birth year]  Age: 67 y.o. MRN#: [id number] Attending Physician: Shon HaleEmokpae, Courage, MD Primary Care [facility] Admit Date: 03/30/2018  Reason for Consultation/Follow-up: Establishing goals of care  Subjective/GOC: Follow-up with patient at bedside this morning. He is awake and alert to person/place. He does not recall what brought him into the hospital. I further discussed diagnoses and current interventions, explaining to Malik Stanton that he is very sick. Initially explained granddaughters decision (when he was first admitted) to not perform RLE amputation and continue course of IV antibiotics. Attempted to explain high risk for recurrent sepsis with underlying problem (gangrene) that we are not fixing. Explained role of comfort and symptom management.   Further spoke with granddaughter, Porcha via telephone. Since her grandfather is more awake and alert, she asks for me to further discuss amputation with him. She again shares that she had strongly encouraged him to pursue left leg BKA and that she wishes for this decision to be made by him. She shares that she respects his decision either way. Sonia Sideorcha explains that her grandfather has poor health literacy and explain it in terms he will understand.   1130: F/u with patient at bedside. Further discussed reasoning for hospitalization including diagnoses and recommendation for amputation. I explained in basic terms to Malik Stanton that he is very sick with infection due to his right foot wound. Explained two pathways of care including amputation versus antibiotics/comfort focused care. Explained that amputation may prolong his life (getting rid of infection  source) but also high risk for recurrent sepsis/organ damage due to blood stream infection/endocarditis. Malik Stanton tells me he will agree to the amputation, hoping that he may live longer with the amputation. He understands he will require at least 6 weeks of IV antibiotics. Patient does not have questions for me. I further discussed decision that was made against resuscitation/life support IF his heart stopped and he died. Explained that these measures would only cause more pain and suffering at EOL with underlying acute illness, co-morbidities, and frailty. Malik Stanton is ok with the decision for NO resuscitation. Although it is unsure if he completely grasps complexity of current conditions, I do believe after our conversation, Malik Stanton understands that he is very sick.   Called  Porcha back and explained her grandfather's decision to have amputation. Porcha respects his decision. Again reiterated high risk for recurrent sepsis with bacteremia/endocarditis. She agrees with PICC line/IV antibiotics per ID. Answered questions and concerns. Updated care team. Sonia Side has PMT contact information.   Length of Stay: 4  Current Medications: Scheduled Meds:  . sodium chloride   Intravenous Once  . aspirin EC  81 mg Oral Daily  . clopidogrel  75 mg Oral Daily  . feeding supplement (ENSURE ENLIVE)  237 mL Oral BID BM  . fluconazole  200 mg Oral Daily  . heparin injection (subcutaneous)  5,000 Units Subcutaneous Q8H  . mouth rinse  15 mL Mouth Rinse BID  . nutrition supplement (JUVEN)  1 packet Oral BID BM  . pantoprazole  40 mg Oral BID  . polyethylene glycol  17 g Oral Daily  . cyanocobalamin  1,000 mcg Oral Daily    Continuous Infusions: . sodium chloride Stopped (04/02/18 0649)  .  ceFAZolin (ANCEF) IV 2 g (04/03/18 0629)  . dextrose 5 % and 0.45 % NaCl with KCl 40 mEq/L 75 mL/hr at 04/03/18 0235    PRN Meds: sodium chloride, acetaminophen, acetaminophen, LORazepam, methocarbamol, morphine injection,  ondansetron **OR** ondansetron (ZOFRAN) IV, oxyCODONE  Physical Exam  Constitutional: He appears ill.  HENT:  Head: Normocephalic and atraumatic.  Cardiovascular: Regular rhythm.  Pulmonary/Chest: No accessory muscle usage. No tachypnea. No respiratory distress.  Abdominal: There is no tenderness.  Neurological: He is alert.  Awake, alert, oriented to person/place  Skin: Skin is warm and dry.  Right foot gangrene  Psychiatric: He has a normal mood and affect. His speech is normal and behavior is normal.  Nursing note and vitals reviewed.          Vital Signs: BP 119/81 (BP Location: Left Arm)   Pulse 97   Temp 97.9 F (36.6 C)   Resp 17   Ht 5\' 10"  (1.778 m)   Wt 49.8 kg   SpO2 97%   BMI 15.75 kg/m  SpO2: SpO2: 97 % O2 Device: O2 Device: Room Air O2 Flow Rate:    Intake/output summary:   Intake/Output Summary (Last 24 hours) at 04/03/2018 1003 Last data filed at 04/02/2018 2055 Gross per 24 hour  Intake 1077.25 ml  Output 420 ml  Net 657.25 ml   LBM: Last BM Date: 04/01/18 Baseline Weight: Weight: 49.8 kg Most recent weight: Weight: 49.8 kg       Palliative Assessment/Data: PPS 30%   Flowsheet Rows     Most Recent Value  Intake Tab  Referral Department  Hospitalist  Unit at Time of Referral  Med/Surg Unit  Palliative Care Primary Diagnosis  Sepsis/Infectious Disease  Date Notified  03/30/18  Palliative Care Type  New Palliative care  Reason for referral  Clarify Goals of Care  Date of Admission  03/30/18  Date first seen by Palliative Care  04/01/18  # of days Palliative referral response time  2 Day(s)  # of days IP prior to Palliative referral  0  Clinical Assessment  Palliative Performance Scale Score  30%  Psychosocial & Spiritual Assessment  Palliative Care Outcomes  Patient/Family meeting held?  Yes  Who was at the meeting?  granddaughter/cousin  Palliative Care Outcomes  Clarified goals of care, Counseled regarding hospice, Provided end of  life care assistance, Provided psychosocial or spiritual support, ACP counseling assistance      Patient Active Problem List   Diagnosis Date Noted  . Protein-calorie malnutrition,  severe 04/03/2018  . 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 Cellulitis of right lower extremity   . Palliative care by specialist   . Goals of care, counseling/discussion   . Acute GI bleeding   . AKI (acute kidney injury) (HCC)   . Neurocognitive deficits 03/30/2018  . Sepsis (HCC) 03/30/2018  . Essential hypertension 03/01/2018  . PVD (peripheral vascular disease) (HCC) 03/01/2018  . Gangrene of foot (HCC) 03/01/2018  . Normocytic anemia 03/01/2018  . 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 Critical lower limb ischemia 02/18/2018  . Hypokalemia 02/18/2018    Palliative Care Assessment & Plan   Patient Profile: 67 y.o. male  with past medical history of PVD, dementia, hypertension admitted on 03/30/2018 with right foot gangrene. 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 In ED, patient found to be septic likely due to right foot gangrene. Blood cultures positive for MSSA bacteremia. ID following. Patient receiving IV ancef. Vascular surgery consulted and recommending RLE amputation. Per attending, family opting against surgical intervention. Palliative medicine consultation for goals of care.   Assessment: MSSA bacteremia Sepsis Presumed MSSA endocarditis Chronic anemia PAD/PVD Moderate protein calorie malnutrition Hx dementia  Recommendations/Plan:  After further discussion with patient and granddaughter, decision has been made to pursue RLE amputation. Updated attending who has discussed with vascular surgery.   Plan for PICC line/IV antibiotics per ID.   Patient would benefit from outpatient palliative f/u at SNF with poor long-term prognosis and high risk for decline/recurrent hospitalization.   Code Status: DNR   Code Status Orders  (From admission,  onward)         Start     Ordered   03/30/18 1630  Do not attempt resuscitation (DNR)  Continuous    Question Answer Comment  In the event of cardiac or respiratory ARREST Do not call a "code blue"   In the event of cardiac or respiratory ARREST Do not perform Intubation, CPR, defibrillation or ACLS   In the event of cardiac or respiratory ARREST Use medication by any route, position, wound care, and other measures to relive pain and suffering. May use oxygen, suction and manual treatment of airway obstruction as needed for comfort.      03/30/18 1629        Code Status History    Date Active Date Inactive Code Status Order ID Comments User Context   03/30/2018 1525 03/30/2018 1629 Full Code [id number]  Alessandra Bevels, MD ED   03/01/2018 0448 03/07/2018 1805 Full Code [id number]  Briscoe Deutscher, MD Inpatient   02/18/2018 0425 02/22/2018 2121 Full Code [id number]  Pearson Grippe, MD Inpatient       Prognosis:   Poor prognosis with sepsis secondary to right food gangrene, MSSA bacteremia, presumed endocarditis, declining functional/nutritional status.   Discharge Planning:  To Be Determined  Care plan was discussed with patient, granddaughter, Dr. Mariea Clonts, RN  Thank you for allowing the Palliative Medicine Team to assist in the care of this patient.   Time In: 0945- 1130- Time Out: 1015 1150 Total Time 50 Prolonged Time Billed no      Greater than 50%  of this time was spent counseling and coordinating care related to the above assessment and plan.  Vennie Homans, FNP-C Palliative Medicine Team  Phone: [phone] Fax: [phone]  Please contact Palliative Medicine Team phone at [phone] for questions and concerns.

## 2018-04-04 ENCOUNTER — Inpatient Hospital Stay (HOSPITAL_COMMUNITY): Payer: Medicare Other | Admitting: Anesthesiology

## 2018-04-04 ENCOUNTER — Encounter (HOSPITAL_COMMUNITY)
Admission: EM | Disposition: A | Discharge status: Skilled Nursing Facility | Payer: Self-pay | Source: Skilled Nursing Facility | Attending: Family Medicine

## 2018-04-04 DIAGNOSIS — I96 Gangrene, not elsewhere classified: Secondary | ICD-10-CM

## 2018-04-04 HISTORY — PX: AMPUTATION: SHX166

## 2018-04-04 LAB — CBC
HCT: 27.9 % — ABNORMAL LOW (ref 39.0–52.0)
Hemoglobin: 8.7 g/dL — ABNORMAL LOW (ref 13.0–17.0)
MCH: 26.4 pg (ref 26.0–34.0)
MCHC: 31.2 g/dL (ref 30.0–36.0)
MCV: 84.8 fL (ref 80.0–100.0)
Platelets: 517 10*3/uL — ABNORMAL HIGH (ref 150–400)
RBC: 3.29 MIL/uL — AB (ref 4.22–5.81)
RDW: 17.5 % — AB (ref 11.5–15.5)
WBC: 15.4 10*3/uL — ABNORMAL HIGH (ref 4.0–10.5)
nRBC: 0 % (ref 0.0–0.2)

## 2018-04-04 LAB — BASIC METABOLIC PANEL
ANION GAP: 8 (ref 5–15)
BUN: 13 mg/dL (ref 8–23)
CALCIUM: 8.5 mg/dL — AB (ref 8.9–10.3)
CO2: 20 mmol/L — ABNORMAL LOW (ref 22–32)
Chloride: 109 mmol/L (ref 98–111)
Creatinine, Ser: 0.86 mg/dL (ref 0.61–1.24)
GFR calc Af Amer: >60 mL/min (ref >60)
GLUCOSE: 108 mg/dL — AB (ref 70–99)
Potassium: 3.9 mmol/L (ref 3.5–5.1)
Sodium: 137 mmol/L (ref 135–145)

## 2018-04-04 LAB — PROTIME-INR
INR: 1.13
PROTHROMBIN TIME: 14.4 s (ref 11.4–15.2)

## 2018-04-04 LAB — GLUCOSE, CAPILLARY
GLUCOSE-CAPILLARY: 100 mg/dL — AB (ref 70–99)
Glucose-Capillary: 99 mg/dL (ref 70–99)
Glucose-Capillary: 71 mg/dL (ref 70–99)
Glucose-Capillary: 106 mg/dL — ABNORMAL HIGH (ref 70–99)
Glucose-Capillary: 119 mg/dL — ABNORMAL HIGH (ref 70–99)

## 2018-04-04 LAB — SURGICAL PCR SCREEN
MRSA, PCR: POSITIVE — AB
Staphylococcus aureus: POSITIVE — AB

## 2018-04-04 SURGERY — AMPUTATION BELOW KNEE
Anesthesia: General | Site: Leg Lower | Laterality: Right

## 2018-04-04 MED ORDER — LACTATED RINGERS IV SOLN
INTRAVENOUS | Status: DC | PRN
  Administered 2018-04-04: 09:00:00 via INTRAVENOUS

## 2018-04-04 MED ORDER — LACTATED RINGERS IV SOLN
INTRAVENOUS | Status: DC
  Administered 2018-04-04: 17:00:00 via INTRAVENOUS

## 2018-04-04 MED ORDER — SODIUM CHLORIDE 0.9 % IV SOLN
INTRAVENOUS | Status: DC
  Administered 2018-04-04: 23:00:00 via INTRAVENOUS

## 2018-04-04 MED ORDER — SODIUM CHLORIDE 0.9% FLUSH: 10.0000 mL | INTRAVENOUS | Status: DC | PRN

## 2018-04-04 MED ORDER — ONDANSETRON HCL 4 MG/2ML IJ SOLN
INTRAMUSCULAR | Status: DC | PRN
  Administered 2018-04-04: 4 mg via INTRAVENOUS

## 2018-04-04 MED ORDER — SODIUM CHLORIDE 0.9% FLUSH: 10.0000 mL | Freq: Two times a day (BID) | INTRAVENOUS | Status: DC

## 2018-04-04 MED ORDER — OXYCODONE-ACETAMINOPHEN 5-325 MG PO TABS: 1.0000 | ORAL_TABLET | ORAL | Status: DC | PRN

## 2018-04-04 MED ORDER — PHENYLEPHRINE HCL 10 MG/ML IJ SOLN
INTRAMUSCULAR | Status: DC | PRN
  Administered 2018-04-04: 50 ug via INTRAVENOUS
  Administered 2018-04-04: 100 ug via INTRAVENOUS

## 2018-04-04 MED ORDER — FENTANYL CITRATE (PF) 250 MCG/5ML IJ SOLN
INTRAMUSCULAR | Status: AC
  Filled 2018-04-04: qty 5

## 2018-04-04 MED ORDER — PROPOFOL 10 MG/ML IV BOLUS
INTRAVENOUS | Status: DC | PRN
  Administered 2018-04-04: 70 mg via INTRAVENOUS

## 2018-04-04 MED ORDER — FENTANYL CITRATE (PF) 100 MCG/2ML IJ SOLN
INTRAMUSCULAR | Status: DC | PRN
  Administered 2018-04-04 (×2): 25 ug via INTRAVENOUS

## 2018-04-04 MED ORDER — LIDOCAINE 2% (20 MG/ML) 5 ML SYRINGE
INTRAMUSCULAR | Status: DC | PRN
  Administered 2018-04-04: 30 mg via INTRAVENOUS

## 2018-04-04 MED ORDER — 0.9 % SODIUM CHLORIDE (POUR BTL) OPTIME
TOPICAL | Status: DC | PRN
  Administered 2018-04-04: 1000 mL

## 2018-04-04 MED ORDER — CHLORHEXIDINE GLUCONATE CLOTH 2 % EX PADS
6.0000 | MEDICATED_PAD | Freq: Every day | CUTANEOUS | Status: DC
  Administered 2018-04-05 – 2018-04-07 (×3): 6 via TOPICAL

## 2018-04-04 MED ORDER — MUPIROCIN 2 % EX OINT
1.0000 "application " | TOPICAL_OINTMENT | Freq: Two times a day (BID) | CUTANEOUS | Status: DC
  Administered 2018-04-04 – 2018-04-07 (×8): 1 via NASAL
  Filled 2018-04-04 (×4): qty 22

## 2018-04-04 SURGICAL SUPPLY — 52 items
BANDAGE ACE 4X5 VEL STRL LF (GAUZE/BANDAGES/DRESSINGS) IMPLANT
BANDAGE ACE 6X5 VEL STRL LF (GAUZE/BANDAGES/DRESSINGS) IMPLANT
BANDAGE ELASTIC 4 VELCRO ST LF (GAUZE/BANDAGES/DRESSINGS) ×2 IMPLANT
BANDAGE ESMARK 6X9 LF (GAUZE/BANDAGES/DRESSINGS) IMPLANT
BLADE LONG MED 31MMX9MM (MISCELLANEOUS)
BLADE LONG MED 31X9 (MISCELLANEOUS) IMPLANT
BLADE SAW GIGLI 510 (BLADE) ×2 IMPLANT
BLADE SAW GIGLI 510MM (BLADE) ×1
BNDG COHESIVE 6X5 TAN STRL LF (GAUZE/BANDAGES/DRESSINGS) ×3 IMPLANT
BNDG ESMARK 6X9 LF (GAUZE/BANDAGES/DRESSINGS)
BNDG GAUZE ELAST 4 BULKY (GAUZE/BANDAGES/DRESSINGS) ×2 IMPLANT
CANISTER SUCT 3000ML PPV (MISCELLANEOUS) ×3 IMPLANT
CLIP VESOCCLUDE MED 6/CT (CLIP) IMPLANT
COVER SURGICAL LIGHT HANDLE (MISCELLANEOUS) ×3 IMPLANT
COVER WAND RF STERILE (DRAPES) ×3 IMPLANT
CUFF TOURNIQUET SINGLE 34IN LL (TOURNIQUET CUFF) IMPLANT
CUFF TOURNIQUET SINGLE 44IN (TOURNIQUET CUFF) IMPLANT
DRAIN CHANNEL 19F RND (DRAIN) IMPLANT
DRAPE HALF SHEET 40X57 (DRAPES) ×3 IMPLANT
DRAPE ORTHO SPLIT 77X108 STRL (DRAPES) ×4
DRAPE SURG ORHT 6 SPLT 77X108 (DRAPES) ×2 IMPLANT
DRSG ADAPTIC 3X8 NADH LF (GAUZE/BANDAGES/DRESSINGS) ×3 IMPLANT
ELECT REM PT RETURN 9FT ADLT (ELECTROSURGICAL) ×3
ELECTRODE REM PT RTRN 9FT ADLT (ELECTROSURGICAL) ×1 IMPLANT
EVACUATOR SILICONE 100CC (DRAIN) IMPLANT
GAUZE SPONGE 4X4 12PLY STRL (GAUZE/BANDAGES/DRESSINGS) ×3 IMPLANT
GAUZE SPONGE 4X4 12PLY STRL LF (GAUZE/BANDAGES/DRESSINGS) ×2 IMPLANT
GLOVE BIO SURGEON STRL SZ7.5 (GLOVE) ×3 IMPLANT
GOWN STRL REUS W/ TWL LRG LVL3 (GOWN DISPOSABLE) ×2 IMPLANT
GOWN STRL REUS W/ TWL XL LVL3 (GOWN DISPOSABLE) ×1 IMPLANT
GOWN STRL REUS W/TWL LRG LVL3 (GOWN DISPOSABLE) ×4
GOWN STRL REUS W/TWL XL LVL3 (GOWN DISPOSABLE) ×2
KIT BASIN OR (CUSTOM PROCEDURE TRAY) ×3 IMPLANT
KIT TURNOVER KIT B (KITS) ×3 IMPLANT
NS IRRIG 1000ML POUR BTL (IV SOLUTION) ×3 IMPLANT
PACK GENERAL/GYN (CUSTOM PROCEDURE TRAY) ×3 IMPLANT
PAD ARMBOARD 7.5X6 YLW CONV (MISCELLANEOUS) ×6 IMPLANT
STAPLER VISISTAT 35W (STAPLE) ×3 IMPLANT
STOCKINETTE IMPERVIOUS LG (DRAPES) ×3 IMPLANT
SUT BONE WAX W31G (SUTURE) IMPLANT
SUT ETHILON 3 0 PS 1 (SUTURE) IMPLANT
SUT SILK 0 TIES 10X30 (SUTURE) ×5 IMPLANT
SUT SILK 2 0 (SUTURE) ×2
SUT SILK 2 0 SH CR/8 (SUTURE) ×3 IMPLANT
SUT SILK 2-0 18XBRD TIE 12 (SUTURE) ×1 IMPLANT
SUT SILK 3 0 (SUTURE)
SUT SILK 3-0 18XBRD TIE 12 (SUTURE) IMPLANT
SUT VIC AB 2-0 CT1 18 (SUTURE) ×6 IMPLANT
TAPE UMBILICAL COTTON 1/8X30 (MISCELLANEOUS) ×2 IMPLANT
TOWEL GREEN STERILE (TOWEL DISPOSABLE) ×6 IMPLANT
UNDERPAD 30X30 (UNDERPADS AND DIAPERS) ×3 IMPLANT
WATER STERILE IRR 1000ML POUR (IV SOLUTION) ×3 IMPLANT

## 2018-04-04 NOTE — Interval H&P Note (Signed)
History and Physical Interval Note:  04/04/2018 8:40 AM  Malik Stanton  has presented today for surgery, with the diagnosis of nonviable tissue  The various methods of treatment have been discussed with the patient and family. After consideration of risks, benefits and other options for treatment, the patient has consented to  Procedure(s): AMPUTATION BELOW KNEE (Right) as a surgical intervention .  The patient's history has been reviewed, patient examined, no change in status, stable for surgery.  I have reviewed the patient's chart and labs.  Questions were answered to the patient's satisfaction.     Wells Regnia Mathwig   

## 2018-04-04 NOTE — Progress Notes (Signed)
[facility] still awaiting insurance approval for patient.   Osborne Cascoadia Demitri Kucinski LCSW [phone]

## 2018-04-04 NOTE — Anesthesia Procedure Notes (Signed)
Procedure Name: LMA Insertion Date/Time: 04/04/2018 10:39 AM Performed by: Gwenyth AllegraAdami, Rekia Kujala, CRNA Pre-anesthesia Checklist: Patient identified, Emergency Drugs available, Suction available, Patient being monitored and Timeout performed Patient Re-evaluated:Patient Re-evaluated prior to induction Oxygen Delivery Method: Circle system utilized Preoxygenation: Pre-oxygenation with 100% oxygen LMA Size: 4.0 Number of attempts: 1 Placement Confirmation: positive ETCO2 and breath sounds checked- equal and bilateral Tube secured with: Tape Dental Injury: Teeth and Oropharynx as per pre-operative assessment

## 2018-04-04 NOTE — Anesthesia Preprocedure Evaluation (Addendum)
Anesthesia Evaluation  Patient identified by MRN, date of birth, ID band Patient awake    Reviewed: Allergy & Precautions, NPO status , Patient's Chart, lab work & pertinent test results  History of Anesthesia Complications Negative for: history of anesthetic complications  Airway Mallampati: II  TM Distance: >3 FB Neck ROM: Full    Dental  (+) Dental Advisory Given, Poor Dentition, Missing   Pulmonary former smoker,    breath sounds clear to auscultation       Cardiovascular hypertension, Pt. on medications and Pt. on home beta blockers + Peripheral Vascular Disease   Rhythm:Regular Rate:Normal   '19 TTE -  EF 65% to 70%. Indeterminate diastolic function. Trivial TR.     Neuro/Psych PSYCHIATRIC DISORDERS Dementia negative neurological ROS     GI/Hepatic negative GI ROS,  Elevated LFTs    Endo/Other  negative endocrine ROS  Renal/GU Renal disease     Musculoskeletal negative musculoskeletal ROS (+)   Abdominal   Peds  Hematology  (+) anemia ,   Anesthesia Other Findings   Reproductive/Obstetrics                            Anesthesia Physical Anesthesia Plan  ASA: III  Anesthesia Plan: General   Post-op Pain Management:    Induction: Intravenous  PONV Risk Score and Plan: 3 and Treatment may vary due to age or medical condition, Ondansetron and Propofol infusion  Airway Management Planned: LMA  Additional Equipment: None  Intra-op Plan:   Post-operative Plan: Extubation in OR  Informed Consent: I have reviewed the patients History and Physical, chart, labs and discussed the procedure including the risks, benefits and alternatives for the proposed anesthesia with the patient or authorized representative who has indicated his/her understanding and acceptance.   Dental advisory given  Plan Discussed with: CRNA and Anesthesiologist  Anesthesia Plan Comments: (Discussed  DNR with patient. He is agreeable to intubation/airway management for procedure, vasopressors, and IVF. He is also agreeable to chest compressions in the perioperative period as necessary. 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

## 2018-04-04 NOTE — Op Note (Signed)
    Patient name: Malik Stanton MRN: [id number] DOB: 1950/09/14 Sex: male  04/04/2018 Pre-operative Diagnosis: Ischemic right leg Post-operative diagnosis:  Same Surgeon:  Durene CalWells Brabham Assistants: Clinton GallantEmma Collins Procedure:   Right below-knee amputation Anesthesia: General Blood Loss: Minimal Specimens: Right leg  Findings: Viable muscle of amputation site  Indications: The patient has recently undergone left below-knee amputation.  He came in septic with a right foot wound.  He initially elected to proceed with palliative care however he has improved with antibiotics and now wishes to proceed with amputation.  His right leg is not viable.  Procedure:  The patient was identified in the holding area and [facility]  The patient was then placed supine on the table. general anesthesia was administered.  The patient was prepped and draped in the usual sterile fashion.  A time out was called and antibiotics were administered.  A two third one third posterior flap incision was made 10 cm distal to the tibial tuberosity.  Cautery was used about subcutaneous tissue and the muscle.  The tibia was circumferentially exposed.  Periosteal elevators were used to elevate the periosteum and a Gigli saw was used to transect the tibial blank on the anterior surface.  Next the fibula was freed up and then transected with large bone cutters proximal to the cut edge of the tibia.  Next, an amputation knife was used to transect the remaining tissue.  I isolated the neurovascular complexes and ligated the artery vein and nerve individually, proximal to the cut edge of the tibia.  The wound was then copiously irrigated.  Hemostasis was achieved.  A rasp was used to smooth the bone surfaces.  Once I was satisfied with hemostasis, the fascia was reapproximated with interrupted 2-0 Vicryl suture and the skin was closed with staples followed by sterile dressing.  There were no immediate complications.   Disposition:  To PACU stable   V. Durene CalWells Brabham, M.D. [facility] Office: [phone] Pager:  [phone]

## 2018-04-04 NOTE — Progress Notes (Signed)
Pharmacy Antibiotic Note  Malik Stanton is a 67 y.o. male admitted on 03/30/2018 with bacteremia and endocarditis.  Pharmacy has been consulted for Ancef dosing.  ID:  MSSA bacteremia/endocarditis, R foot gangrene. Had TTE suggestive of MV vegetation. ID consulted, abx changed to Cefazolin.  Right foot ischemia with gangrenous changes (Prev L BKA 03-01-18).  Dose increased to 2g q8h on 11/25 (severe infection).R BKA 11/27 for source control. - Afebrile, WBC 15.4 down, Scr stable  Cefepime  11/22>11/23 Vanc 11/22 x1 Ceftriazone 11/22 x1 Fluconazole po 11/24>11/29 Cefazlin 11/23>>05/13/18  1/22 BCx x2 : MSSA   BCID: staph aureus  11/22 BCX x2: MSSA 11/24: BCX x2:  ngtd   Plan: Cefazolin 2g q8h x 6 wks through 05/13/18 OPAT done 11/25 11/27 R BKA    Height: 5\' 10"  (177.8 cm) Weight: 109 lb 12.6 oz (49.8 kg) IBW/kg (Calculated) : 73  Temp (24hrs), Avg:98.2 F (36.8 C), Min:97.9 F (36.6 C), Max:98.6 F (37 C)  Recent Labs  Lab 03/30/18 1200  03/30/18 1428 03/30/18 2105 03/31/18 0036 03/31/18 0210 03/31/18 2105 04/01/18 0451 04/02/18 0631 04/03/18 0451 04/04/18 0334  WBC  --    < >  --   --   --  22.6*  --  19.5* 21.6* 18.0* 15.4*  CREATININE  --    < >  --   --   --  1.37* 1.16 1.12  --  0.98 0.86  LATICACIDVEN 4.77*  --  2.96* 1.7 1.5  --   --   --   --   --   --    < > = values in this interval not displayed.    Estimated Creatinine Clearance: 59.5 mL/min (by C-G formula based on SCr of 0.86 mg/dL).    No Known Allergies   Merleen Picazo S. Merilynn Finlandobertson, PharmD, BCPS Clinical Staff Pharmacist Misty StanleyRobertson, Pearlean Sabina Stillinger 04/04/2018 2:59 PM

## 2018-04-04 NOTE — Progress Notes (Signed)
Patient Demographics:    Malik Stanton, is a 67 y.o. male, DOB - 1951-02-05, Malik Stanton  Admit date - 03/30/2018   Admitting Physician Malik Bevels, MD  Outpatient Primary MD for the patient is Malik Stanton  LOS - 5   Chief Complaint  Patient presents with  . Blood Infection       Brief Summary   67 y.o. 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     Subjective:   Patient in bed, appears comfortable, denies any headache, no fever, no chest pain or pressure, no shortness of breath , no abdominal pain. No focal weakness.   Assessment  & Plan :    1)MSSA Bacteremia/Sepsis--- blood cultures from 03/30/2018 with MSSA , most likely secondary to right foot gangrene, infectious disease input appreciated, neurosurgery Malik Stanton following the patient and he is due for right foot BKA on 04/04/2018.  Type screen and monitor.  Continue antibiotics for a total of 6 weeks, Stop Date of May 12, 2018 for presumed endocarditis as well, see #2 below.  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   3)Chronic Anemia--- Hgb is stable at 8.8 which is close to his recent baseline,  on admission was concerned about dark stools/possible GI bleed, no further bleeding noted  despite being on IV heparin for several days for PAD, GI consult from Malik Stanton appreciated, no GI interventions planned, continue oral Protonix  4) FEN/Moderate Protein Caloric Malnutrition --- hypokalemia resolved with replacement, magnesium was normal, continue nutritional supplements  5)PAD--- postop we will continue  aspirin and Plavix,, right foot gangrene noted, , for right BKA on 04/04/2018  6) Social/Ethics--patient is DNR/DNI, plan of care discussed with patient granddaughter Malik Stanton  ([phone]) and patient's son Malik Stanton...Marland KitchenMarland KitchenMarland Kitchen granddaughter is primary contact, palliative care consult for GOC appreciated, family initially declined right BKA, patient is more awake now after further conversations with patient and granddaughter plan is for right BKA on 04/04/2018    7)AKI----acute kidney injury,     creatinine on admission= 2.42 ,   baseline creatinine =0.7    , creatinine is now=0.98, resolved AKI with hydration  renally adjust medications, avoid nephrotoxic agents / dehydration/hypotension     Disposition/Need for in-Hospital Stay - SNF  Code Status : DNR  Disposition Plan  : --- SNF with palliative care and iv Ancef for 6 weeks stop date May 12, 2018.     Consults  : Palliative care/vascular surgery/infectious disease  Procedure  R. BKA due 04/04/18  TTE 04/02/18  - Cannot exclude small independently mobile lesions on the anterior mitral leaflet (ventricular aspect). Image quality is suboptimal to further assess.  DVT Prophylaxis  :  SQ Heparin   Lab Results  Component Value Date   PLT 517 (H) 04/04/2018    Inpatient Medications  Scheduled Meds: . [MAR Hold] sodium chloride   Intravenous Once  . [MAR Hold] aspirin EC  81 mg Oral Daily  . [MAR Hold] Chlorhexidine Gluconate Cloth  6 each Topical Daily  . [MAR Hold] clopidogrel  75 mg Oral Daily  . [MAR Hold] feeding supplement (ENSURE ENLIVE)  237 mL Oral BID BM  . [MAR Hold] fluconazole   200 mg Oral Daily  . [MAR Hold] heparin injection (subcutaneous)  5,000 Units Subcutaneous Q8H  . [MAR Hold] mouth rinse  15 mL Mouth Rinse BID  . [MAR Hold] mupirocin ointment  1 application Nasal BID  . [MAR Hold] nutrition supplement (JUVEN)  1 packet Oral BID BM  . [MAR Hold] pantoprazole  40 mg Oral BID  . [MAR Hold] polyethylene glycol  17 g Oral Daily  . [MAR Hold] sodium chloride flush  10-40 mL Intracatheter Q12H  . [MAR Hold] cyanocobalamin  1,000 mcg Oral Daily   Continuous Infusions: . [MAR Hold] sodium chloride Stopped (04/02/18 0649)  . [MAR Hold]  ceFAZolin (ANCEF) IV 2 g (04/04/18 0617)  . dextrose 5 % and 0.45 % NaCl with KCl 40 mEq/L 75 mL/hr at 04/03/18 1619  . lactated ringers     PRN Meds:.[MAR Hold] sodium chloride, [MAR Hold] acetaminophen, [MAR Hold] acetaminophen, [MAR Hold] LORazepam, [MAR Hold] methocarbamol, [MAR Hold]  morphine injection, [MAR Hold] ondansetron **OR** [MAR Hold] ondansetron (ZOFRAN) IV, [MAR Hold] oxyCODONE, [MAR Hold] sodium chloride flush    Anti-infectives (From admission, onward)   Start     Dose/Rate Route Frequency Ordered Stop   04/02/18 1400  [MAR Hold]  ceFAZolin (ANCEF) IVPB 2g/100 mL premix     (MAR Hold since Wed 04/04/2018 at 0958. Reason: Transfer to a Procedural area.)   2 g 200 mL/hr over 30 Minutes Intravenous Every 8 hours 04/02/18 1105 05/13/18 2359   04/01/18 1530  [MAR Hold]  fluconazole (DIFLUCAN) tablet 200 mg     (MAR Hold since Wed 04/04/2018 at 0958. Reason: Transfer to a Procedural area.)   200 mg Oral Daily 04/01/18 1524 04/06/18 0959   03/31/18 1530  ceFAZolin (ANCEF) IVPB 1 g/50 mL premix  Status:  Discontinued     1 g 100 mL/hr over 30 Minutes Intravenous Every 8 hours 03/31/18 1443 04/02/18 1105   03/31/18 1000  ceFEPIme (MAXIPIME) 1 g in sodium chloride 0.9 % 100 mL IVPB  Status:  Discontinued     1 g 200 mL/hr over 30 Minutes Intravenous Every 24 hours 03/30/18 1525 03/31/18 1324   03/30/18 1600   vancomycin (VANCOCIN) IVPB 1000 mg/200 mL premix     1,000 mg 200 mL/hr over 60 Minutes Intravenous  Once 03/30/18 1525 03/30/18 2012   03/30/18 1145  cefTRIAXone (ROCEPHIN) 2 g in sodium chloride 0.9 % 100 mL IVPB  Status:  Discontinued     2 g 200 mL/hr over 30 Minutes Intravenous Every 24 hours 03/30/18 1136 03/30/18 1525        Objective:   Vitals:   04/03/18 2040 04/04/18 0425 04/04/18 1137 04/04/18 1152  BP: 124/80 120/77 118/71 101/72  Pulse: (!) 113 (!) 106 99 98  Resp: 16 16 (!) 27 (!) 25  Temp: 98.4 F (36.9 C) 98.6 F (37 C) 97.9 F (36.6 C)   TempSrc:  Oral    SpO2: 98% 97% 100% 99%  Weight:      Height:        Wt Readings from Last 3 Encounters:  03/30/18 49.8 kg  03/29/18 49.9 kg  03/08/18 56.9 kg     Intake/Output Summary (Last 24 hours) at 04/04/2018 1154 Last data filed at 04/04/2018 1126 Gross per 24 hour  Intake 700 ml  Output 580 ml  Net 120 ml     Physical Exam  Awake Alert,   No new F.N deficits, Normal affect [place].AT,PERRAL Supple Neck,No JVD, No cervical lymphadenopathy appriciated.  Symmetrical Chest wall movement, Good air movement bilaterally, CTAB RRR,No Gallops, Rubs or new Murmurs, No Parasternal Heave +ve B.Sounds, Abd Soft, No tenderness, No organomegaly appriciated, No rebound - guarding or rigidity. No Cyanosis, Clubbing or edema, L BKA, R foot below  Media Information   Document Information   Photos  Right foot 03/30/18  03/30/2018 14:18  Attached To:  Hospital Encounter on 03/30/18  Source Information   Rhyne, Ames Coupe, PA-C  [facility]      Data Review:   Micro Results Recent Results (from the past 240 hour(s))  Blood Culture (routine x 2)     Status: Abnormal   Collection Time: 03/30/18 12:06 PM  Result Value Ref Range Status   Specimen Description BLOOD RIGHT ANTECUBITAL  Final   Special Requests   Final    BOTTLES DRAWN AEROBIC AND ANAEROBIC Blood Culture adequate volume   Culture  Setup Time    Final    GRAM POSITIVE COCCI IN BOTH AEROBIC AND ANAEROBIC BOTTLES CRITICAL VALUE NOTED.  VALUE IS CONSISTENT WITH PREVIOUSLY REPORTED AND CALLED VALUE.    Culture (A)  Final    STAPHYLOCOCCUS AUREUS SUSCEPTIBILITIES PERFORMED ON PREVIOUS CULTURE WITHIN THE LAST 5 DAYS. Performed at [name], [address], [place], [place] [address]    Report Status 04/02/2018 FINAL  Final  Blood Culture (routine x 2)     Status: Abnormal   Collection Time: 03/30/18 12:15 PM  Result Value Ref Range Status   Specimen Description BLOOD BLOOD RIGHT ARM  Final   Special Requests   Final    BOTTLES DRAWN AEROBIC AND ANAEROBIC Blood Culture adequate volume   Culture  Setup Time   Final    GRAM POSITIVE COCCI IN BOTH AEROBIC AND ANAEROBIC BOTTLES CRITICAL RESULT CALLED TO, READ BACK BY AND VERIFIED WITHMelven Sartorius [facility] 6045 03/31/18 A BROWNING Performed at [facility], [address], [place], [place] [address]    Culture STAPHYLOCOCCUS AUREUS (A)  Final   Report Status 04/02/2018 FINAL  Final   Organism ID, Bacteria STAPHYLOCOCCUS AUREUS  Final      Susceptibility   Staphylococcus aureus - MIC*    CIPROFLOXACIN <=0.5 SENSITIVE Sensitive     ERYTHROMYCIN <=0.25 SENSITIVE Sensitive     GENTAMICIN <=0.5 SENSITIVE Sensitive     OXACILLIN 0.5 SENSITIVE Sensitive     TETRACYCLINE <=1 SENSITIVE Sensitive     VANCOMYCIN <=0.5 SENSITIVE Sensitive     TRIMETH/SULFA <=10 SENSITIVE Sensitive     CLINDAMYCIN <=0.25 SENSITIVE Sensitive     RIFAMPIN <=0.5 SENSITIVE Sensitive     Inducible Clindamycin NEGATIVE Sensitive     * STAPHYLOCOCCUS AUREUS  Blood Culture ID Panel (Reflexed)     Status: Abnormal   Collection Time: 03/30/18 12:15 PM  Result Value Ref Range Status   Enterococcus species NOT DETECTED NOT DETECTED Final   Listeria monocytogenes NOT DETECTED NOT DETECTED Final   Staphylococcus species DETECTED (A)  NOT DETECTED Final    Comment: CRITICAL RESULT CALLED TO, READ BACK BY  AND VERIFIED WITHMelven Sartorius: J LEDFORD PHARMD [id number] 03/31/18 A BROWNING    Staphylococcus aureus (BCID) DETECTED (A) NOT DETECTED Final    Comment: Methicillin (oxacillin) susceptible Staphylococcus aureus (MSSA). Preferred therapy is anti staphylococcal beta lactam antibiotic (Cefazolin or Nafcillin), unless clinically contraindicated. CRITICAL RESULT CALLED TO, READ BACK BY AND VERIFIED WITHMelven Sartorius: J LEDFORD [facility] [id number] 03/31/18 A BROWNING    Methicillin resistance NOT DETECTED NOT DETECTED Final   Streptococcus species NOT DETECTED NOT DETECTED Final   Streptococcus agalactiae NOT DETECTED NOT DETECTED Final   Streptococcus pneumoniae NOT DETECTED NOT DETECTED Final   Streptococcus pyogenes NOT DETECTED NOT DETECTED Final   Acinetobacter baumannii NOT DETECTED NOT DETECTED Final   Enterobacteriaceae species NOT DETECTED NOT DETECTED Final   Enterobacter cloacae complex NOT DETECTED NOT DETECTED Final   Escherichia coli NOT DETECTED NOT DETECTED Final   Klebsiella oxytoca NOT DETECTED NOT DETECTED Final   Klebsiella pneumoniae NOT DETECTED NOT DETECTED Final   Proteus species NOT DETECTED NOT DETECTED Final   Serratia marcescens NOT DETECTED NOT DETECTED Final   Haemophilus influenzae NOT DETECTED NOT DETECTED Final   Neisseria meningitidis NOT DETECTED NOT DETECTED Final   Pseudomonas aeruginosa NOT DETECTED NOT DETECTED Final   Candida albicans NOT DETECTED NOT DETECTED Final   Candida glabrata NOT DETECTED NOT DETECTED Final   Candida krusei NOT DETECTED NOT DETECTED Final   Candida parapsilosis NOT DETECTED NOT DETECTED Final   Candida tropicalis NOT DETECTED NOT DETECTED Final    Comment: Performed at [facility], [address] [place], [place] [address]  Culture, blood (routine x 2)     Status: None (Preliminary result)   Collection Time: 04/01/18  7:09 AM  Result Value Ref Range Status   Specimen Description BLOOD RIGHT ARM  Final   Special Requests   Final    BOTTLES DRAWN  AEROBIC AND ANAEROBIC Blood Culture adequate volume   Culture   Final    NO GROWTH 3 DAYS Performed at [facility], [address] [place], [place] [address]    Report Status PENDING  Incomplete  Culture, blood (routine x 2)     Status: None (Preliminary result)   Collection Time: 04/01/18  7:09 AM  Result Value Ref Range Status   Specimen Description BLOOD RIGHT ARM  Final   Special Requests   Final    BOTTLES DRAWN AEROBIC AND ANAEROBIC Blood Culture adequate volume   Culture   Final    NO GROWTH 3 DAYS Performed at [facility], [address] [place], [place] [address]    Report Status PENDING  Incomplete  Surgical PCR screen     Status: Abnormal   Collection Time: 04/03/18 10:19 PM  Result Value Ref Range Status   MRSA, PCR POSITIVE (A) NEGATIVE Final    Comment: RESULT CALLED TO, READ BACK BY AND VERIFIED WITH: Neville RouteG LOFTON RN [id number] 04/04/18 A BROWNING    Staphylococcus aureus POSITIVE (A) NEGATIVE Final    Comment: (NOTE) The Xpert SA Assay (FDA approved for NASAL specimens in patients 66 years of age and older), is one component of a comprehensive surveillance program. It is not intended to diagnose infection nor to guide or monitor treatment. Performed at [facility], [address] [place], [place] [address]     Radiology Reports Dg Chest Port 1 View  Result Date: 03/30/2018 CLINICAL DATA:  Hypotension. Possible sepsis. EXAM: PORTABLE CHEST 1 VIEW COMPARISON:  02/17/2018. FINDINGS: Normal sized heart. Mildly tortuous and calcified thoracic aorta. Clear lungs with normal vascularity. Bilateral AC joint degenerative changes. IMPRESSION: No acute abnormality. Electronically Signed   By: Beckie Salts M.D.   On: 03/30/2018 11:57   [place] Ekg Site Rite  Result Date: 04/03/2018 If Site Rite image not attached, placement could not be confirmed due to current cardiac rhythm.    CBC Recent Labs  Lab 03/30/18 1206  03/31/18 0210 04/01/18 0451  04/02/18 0631 04/03/18 0451 04/04/18 0334  WBC 26.0*  --  22.6* 19.5* 21.6* 18.0* 15.4*  HGB 8.3*   < > 8.8* 8.2* 8.9* 8.8* 8.7*  HCT 26.5*   < > 27.0* 25.7* 28.2* 27.7* 27.9*  PLT 482*  --  446* 389 491* 466* 517*  MCV 84.4  --  82.8 84.5 85.7 85.0 84.8  MCH 26.4  --  27.0 27.0 27.1 27.0 26.4  MCHC 31.3  --  32.6 31.9 31.6 31.8 31.2  RDW 17.0*  --  15.9* 16.6* 17.2* 17.4* 17.5*  LYMPHSABS 1.0  --   --   --   --   --   --   MONOABS 1.0  --   --   --   --   --   --   EOSABS 0.0  --   --   --   --   --   --   BASOSABS 0.1  --   --   --   --   --   --    < > = values in this interval not displayed.    Chemistries  Recent Labs  Lab 03/30/18 1206 03/31/18 0210 03/31/18 2105 04/01/18 0451 04/03/18 0451 04/04/18 0334  NA 136 138 141 141 139 137  K 3.6 2.5* 2.7* 3.6 3.6 3.9  CL 105 108 111 116* 112* 109  CO2 17* 19* 19* 19* 20* 20*  GLUCOSE 200* 167* 113* 141* 122* 108*  BUN 80* 46* 23 22 12 13   CREATININE 2.42* 1.37* 1.16 1.12 0.98 0.86  CALCIUM 8.9 8.4* 8.2* 8.2* 8.4* 8.5*  MG  --  2.2  --   --   --   --   AST 129*  --   --  110*  --   --   ALT 124*  --   --  89*  --   --   ALKPHOS 246*  --   --  176*  --   --   BILITOT 0.5  --   --  1.3*  --   --    Coagulation profile Recent Labs  Lab 03/30/18 1220 04/04/18 0334  INR 1.46 1.13   Signature  Susa Raring M.D on 04/04/2018 at 11:55 AM   -  To page go to [email or web address] - password [facility]

## 2018-04-04 NOTE — Progress Notes (Signed)
Peripherally Inserted Central Catheter/Midline Placement  The IV Nurse has discussed with the patient and/or persons authorized to consent for the patient, the purpose of this procedure and the potential benefits and risks involved with this procedure.  The benefits include less needle sticks, lab draws from the catheter, and the patient may be discharged home with the catheter. Risks include, but not limited to, infection, bleeding, blood clot (thrombus formation), and puncture of an artery; nerve damage and irregular heartbeat and possibility to perform a PICC exchange if needed/ordered by physician.  Alternatives to this procedure were also discussed.  Bard Power PICC patient education guide, fact sheet on infection prevention and patient information card has been provided to patient /or left at bedside.    PICC/Midline Placement Documentation  PICC Single Lumen 04/04/18 PICC Right Basilic 40 cm 0 cm (Active)  Indication for Insertion or Continuance of Line Prolonged intravenous therapies 04/04/2018  8:42 AM  Exposed Catheter (cm) 0 cm 04/04/2018  8:42 AM  Site Assessment Clean;Dry;Intact 04/04/2018  8:42 AM  Line Status Flushed;No blood return 04/04/2018  8:42 AM  Dressing Type Transparent 04/04/2018  8:42 AM  Dressing Status Clean;Dry;Intact;Antimicrobial disc in place 04/04/2018  8:42 AM  Line Adjustment (NICU/IV Team Only) Yes 04/04/2018  8:42 AM  Dressing Intervention New dressing 04/04/2018  8:42 AM  Dressing Change Due 04/11/18 04/04/2018  8:42 AM    Telephone consent signed by grandaughter   Malik Stanton, Malik Stanton 04/04/2018, 8:42 AM

## 2018-04-04 NOTE — Anesthesia Postprocedure Evaluation (Signed)
Anesthesia Post Note  Patient: Malik Stanton  Procedure(s) Performed: AMPUTATION BELOW KNEE (Right Leg Lower)     Patient location during evaluation: PACU Anesthesia Type: General Level of consciousness: awake and alert Pain management: pain level controlled Vital Signs Assessment: post-procedure vital signs reviewed and stable Respiratory status: spontaneous breathing, nonlabored ventilation and respiratory function stable Cardiovascular status: blood pressure returned to baseline and stable Postop Assessment: no apparent nausea or vomiting Anesthetic complications: no    Last Vitals:  Vitals:   04/04/18 1152 04/04/18 1207  BP: 101/72 119/78  Pulse: 98 95  Resp: (!) 25 20  Temp:  36.6 C  SpO2: 99% 99%    Last Pain:  Vitals:   04/04/18 1207  TempSrc:   PainSc: 0-No pain                 Beryle Lathehomas E Mendell Bontempo

## 2018-04-04 NOTE — Transfer of Care (Signed)
Immediate Anesthesia Transfer of Care Note  Patient: Malik Stanton  Procedure(s) Performed: AMPUTATION BELOW KNEE (Right Leg Lower)  Patient Location: PACU  Anesthesia Type:General  Level of Consciousness: sedated  Airway & Oxygen Therapy: Patient Spontanous Breathing and Patient connected to nasal cannula oxygen  Post-op Assessment: Report given to RN and Post -op Vital signs reviewed and stable  Post vital signs: Reviewed and stable  Last Vitals:  Vitals Value Taken Time  BP 118/71 04/04/2018 11:37 AM  Temp 36.6 C 04/04/2018 11:37 AM  Pulse 102 04/04/2018 11:42 AM  Resp 29 04/04/2018 11:42 AM  SpO2 100 % 04/04/2018 11:42 AM  Vitals shown include unvalidated device data.  Last Pain:  Vitals:   04/04/18 0425  TempSrc: Oral  PainSc:       Patients Stated Pain Goal: 0 (04/01/18 0804)  Complications: No apparent anesthesia complications

## 2018-04-04 NOTE — Interval H&P Note (Signed)
History and Physical Interval Note:  04/04/2018 8:40 AM  Malik Stanton  has presented today for surgery, with the diagnosis of nonviable tissue  The various methods of treatment have been discussed with the patient and family. After consideration of risks, benefits and other options for treatment, the patient has consented to  Procedure(s): AMPUTATION BELOW KNEE (Right) as a surgical intervention .  The patient's history has been reviewed, patient examined, no change in status, stable for surgery.  I have reviewed the patient's chart and labs.  Questions were answered to the patient's satisfaction.     Durene CalWells Rehema Muffley

## 2018-04-05 ENCOUNTER — Encounter (HOSPITAL_COMMUNITY): Payer: Self-pay | Admitting: Surgery

## 2018-04-05 DIAGNOSIS — D72829 Elevated white blood cell count, unspecified: Secondary | ICD-10-CM

## 2018-04-05 DIAGNOSIS — E871 Hypo-osmolality and hyponatremia: Secondary | ICD-10-CM

## 2018-04-05 DIAGNOSIS — G8918 Other acute postprocedural pain: Secondary | ICD-10-CM

## 2018-04-05 DIAGNOSIS — I10 Essential (primary) hypertension: Secondary | ICD-10-CM

## 2018-04-05 DIAGNOSIS — R000 Tachycardia, unspecified: Secondary | ICD-10-CM

## 2018-04-05 DIAGNOSIS — F0390 Unspecified dementia without behavioral disturbance: Secondary | ICD-10-CM

## 2018-04-05 DIAGNOSIS — D62 Acute posthemorrhagic anemia: Secondary | ICD-10-CM

## 2018-04-05 DIAGNOSIS — F03C0 Unspecified dementia, severe, without behavioral disturbance, psychotic disturbance, mood disturbance, and anxiety: Secondary | ICD-10-CM

## 2018-04-05 LAB — PREPARE RBC (CROSSMATCH)

## 2018-04-05 LAB — CBC
HCT: 23.2 % — ABNORMAL LOW (ref 39.0–52.0)
Hemoglobin: 7.5 g/dL — ABNORMAL LOW (ref 13.0–17.0)
MCH: 27.3 pg (ref 26.0–34.0)
MCHC: 32.3 g/dL (ref 30.0–36.0)
MCV: 84.4 fL (ref 80.0–100.0)
PLATELETS: 466 10*3/uL — AB (ref 150–400)
RBC: 2.75 MIL/uL — AB (ref 4.22–5.81)
RDW: 17.7 % — AB (ref 11.5–15.5)
WBC: 11.6 10*3/uL — AB (ref 4.0–10.5)
nRBC: 0 % (ref 0.0–0.2)

## 2018-04-05 LAB — BASIC METABOLIC PANEL
ANION GAP: 6 (ref 5–15)
BUN: 11 mg/dL (ref 8–23)
CALCIUM: 7.9 mg/dL — AB (ref 8.9–10.3)
CHLORIDE: 108 mmol/L (ref 98–111)
CO2: 20 mmol/L — AB (ref 22–32)
CREATININE: 0.82 mg/dL (ref 0.61–1.24)
GFR calc non Af Amer: >60 mL/min (ref >60)
Glucose, Bld: 108 mg/dL — ABNORMAL HIGH (ref 70–99)
Potassium: 3.7 mmol/L (ref 3.5–5.1)
Sodium: 134 mmol/L — ABNORMAL LOW (ref 135–145)

## 2018-04-05 LAB — HEMOGLOBIN AND HEMATOCRIT, BLOOD
HEMATOCRIT: 27.5 % — AB (ref 39.0–52.0)
Hemoglobin: 9.1 g/dL — ABNORMAL LOW (ref 13.0–17.0)

## 2018-04-05 LAB — GLUCOSE, CAPILLARY
GLUCOSE-CAPILLARY: 101 mg/dL — AB (ref 70–99)
GLUCOSE-CAPILLARY: 110 mg/dL — AB (ref 70–99)

## 2018-04-05 LAB — MAGNESIUM: Magnesium: 1.5 mg/dL — ABNORMAL LOW (ref 1.7–2.4)

## 2018-04-05 MED ORDER — SODIUM CHLORIDE 0.9% IV SOLUTION
Freq: Once | INTRAVENOUS | Status: AC
  Administered 2018-04-05: 12:00:00 via INTRAVENOUS

## 2018-04-05 MED ORDER — METOPROLOL TARTRATE 25 MG PO TABS
25.0000 mg | ORAL_TABLET | Freq: Two times a day (BID) | ORAL | Status: DC
  Administered 2018-04-05 – 2018-04-07 (×5): 25 mg via ORAL
  Filled 2018-04-05 (×5): qty 1

## 2018-04-05 MED ORDER — DOCUSATE SODIUM 100 MG PO CAPS
200.0000 mg | ORAL_CAPSULE | Freq: Two times a day (BID) | ORAL | Status: DC
  Administered 2018-04-05 – 2018-04-07 (×5): 200 mg via ORAL
  Filled 2018-04-05 (×6): qty 2

## 2018-04-05 MED ORDER — DIPHENHYDRAMINE HCL 50 MG/ML IJ SOLN: 25.0000 mg | Freq: Four times a day (QID) | INTRAMUSCULAR | Status: DC | PRN

## 2018-04-05 MED ORDER — MORPHINE SULFATE ER 15 MG PO TBCR
15.0000 mg | EXTENDED_RELEASE_TABLET | Freq: Two times a day (BID) | ORAL | Status: AC
  Administered 2018-04-05 – 2018-04-06 (×3): 15 mg via ORAL
  Filled 2018-04-05 (×4): qty 1

## 2018-04-05 MED ORDER — MAGNESIUM SULFATE 2 GM/50ML IV SOLN
2.0000 g | Freq: Once | INTRAVENOUS | Status: AC
  Administered 2018-04-05: 2 g via INTRAVENOUS
  Filled 2018-04-05: qty 50

## 2018-04-05 NOTE — Progress Notes (Addendum)
  Progress Note    04/05/2018 8:28 AM 1 Day Post-Op  Subjective:  Denies pain  Tm 99 now afebrile  Vitals:   04/05/18 0007 04/05/18 0426  BP: 130/80 132/78  Pulse: (!) 110 (!) 105  Resp: 16 18  Temp: (!) 97.3 F (36.3 C) 97.6 F (36.4 C)  SpO2: 95% 98%    Physical Exam: Incisions:  Bandage is clean and dry without drainage.   CBC    Component Value Date/Time   WBC 11.6 (H) 04/05/2018 0358   RBC 2.75 (L) 04/05/2018 0358   HGB 7.5 (L) 04/05/2018 0358   HCT 23.2 (L) 04/05/2018 0358   PLT 466 (H) 04/05/2018 0358   MCV 84.4 04/05/2018 0358   MCH 27.3 04/05/2018 0358   MCHC 32.3 04/05/2018 0358   RDW 17.7 (H) 04/05/2018 0358   LYMPHSABS 1.0 03/30/2018 1206   MONOABS 1.0 03/30/2018 1206   EOSABS 0.0 03/30/2018 1206   BASOSABS 0.1 03/30/2018 1206    BMET    Component Value Date/Time   NA 134 (L) 04/05/2018 0358   K 3.7 04/05/2018 0358   CL 108 04/05/2018 0358   CO2 20 (L) 04/05/2018 0358   GLUCOSE 108 (H) 04/05/2018 0358   BUN 11 04/05/2018 0358   CREATININE 0.82 04/05/2018 0358   CALCIUM 7.9 (L) 04/05/2018 0358   GFRNONAA >60 04/05/2018 0358   GFRAA >60 04/05/2018 0358    INR    Component Value Date/Time   INR 1.13 04/04/2018 0334     Intake/Output Summary (Last 24 hours) at 04/05/2018 0828 Last data filed at 04/05/2018 0717 Gross per 24 hour  Intake 1843.44 ml  Output 280 ml  Net 1563.44 ml     Assessment/Plan:  67 y.o. male is s/p right below knee amputation  1 Day Post-Op  -pt doing well this morning; bandage is clean and dry.  Will take down dressing tomorrow. -PT/OT consult   Doreatha MassedSamantha Rhyne, PA-C [facility] [phone] 04/05/2018 8:28 AM    I have interviewed and examined patient with PA and agree with assessment and plan above.   Lejon Afzal C. Randie Heinzain, MD [facility] [place] Office: [phone] Pager: [phone]

## 2018-04-05 NOTE — Consult Note (Signed)
Physical Medicine and Rehabilitation Consult Reason for Consult: Bilateral amputations Referring Physician: Dara Lords, PA-C   HPI: Malik Stanton is a 67 y.o. 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  Patient had several vascular procedures on bilateral lower extremities with most recent left BKA performed March 01, 2018.  He was discharged to SNF.  History taken from chart review.  Patient was brought to the ED when drainage and odor noted in right lower extremity.  He was noted to have gangrenous changes.  Work-up revealed leukocytosis and AKI, with acute blood loss anemia.  He received a transfusion in the ED.  Vascular and GI and ID and palliative care were consulted.  ID recommended further work-up.  Palliative care, for discussion with granddaughter, who is caregiver, did not wish to pursue further surgical intervention, however patient became more lucid and agreeable to further surgery.  He underwent right BKA on 04/04/2018.  Hospital course further complicated by tachycardia, postoperative pain, hyponatremia, leukocytosis, acute blood loss anemia.  PT/OT evaluations pending.  Review of Systems  Unable to perform ROS: Dementia   Past Medical History:  Diagnosis Date  . Acute GI bleeding 04/02/2018  . Blood clot in vein 02/2018  . Dementia (HCC)   . Hypertension   . PVD (peripheral vascular disease) (HCC)   . PVD (peripheral vascular disease) (HCC)    Past Surgical History:  Procedure Laterality Date  . ABDOMINAL AORTOGRAM W/LOWER EXTREMITY N/A 02/20/2018   Procedure: ABDOMINAL AORTOGRAM W/LOWER EXTREMITY;  Surgeon: Maeola Harman, MD;  Location: [facility] INVASIVE CV LAB;  Service: Cardiovascular;  Laterality: N/A;  bilateral  . AMPUTATION Left 03/02/2018   Procedure: AMPUTATION BELOW KNEE;  Surgeon: Cephus Shelling, MD;  Location: [facility] OR;  Service: Vascular;   Laterality: Left;  . AMPUTATION Right 04/04/2018   Procedure: AMPUTATION BELOW KNEE;  Surgeon: Nada Libman, MD;  Location: [facility] OR;  Service: Vascular;  Laterality: Right;  . OTHER SURGICAL HISTORY     "I was small when I had a surgery on my legs, arms and across my stomach" (02/20/2018)  . PERIPHERAL VASCULAR INTERVENTION Left 02/20/2018   lt. SFA, lt iliac stents  . PERIPHERAL VASCULAR INTERVENTION  02/20/2018   Procedure: PERIPHERAL VASCULAR INTERVENTION;  Surgeon: Maeola Harman, MD;  Location: [facility] INVASIVE CV LAB;  Service: Cardiovascular;;  lt. SFA, lt iliac stents   No pertinent family history of amputations. Social History:  reports that he has quit smoking. His smoking use included cigarettes. He has a 25.00 pack-year smoking history. He has never used smokeless tobacco. He reports that he has current or past drug history. He reports that he does not drink alcohol. Allergies: No Known Allergies Medications Prior to Admission  Medication Sig Dispense Refill  . acetaminophen (TYLENOL) 325 MG tablet Take 2 tablets (650 mg total) by mouth every 6 (six) hours as needed for mild pain (or Fever >/= 101).    [name] amLODIPine-Valsartan-HCTZ 10-320-25 MG TABS Take 1 tablet by mouth daily.     . clopidogrel (PLAVIX) 75 MG tablet Take 1 tablet (75 mg total) by mouth daily. (Patient taking differently: Take 75 mg by mouth every evening. ) 30 tablet 0  . ferrous sulfate 325 (65 FE) MG tablet Take 1 tablet (325 mg total) by mouth 2 (two) times daily with a meal.    . HYDROcodone-acetaminophen (NORCO/VICODIN) 5-325 MG tablet Take 1 tablet by mouth every  8 (eight) hours as needed for moderate pain or severe pain. 10 tablet 0  . methocarbamol (ROBAXIN) 500 MG tablet Take 500 mg by mouth daily as needed for muscle spasms.     . metoprolol succinate (TOPROL-XL) 25 MG 24 hr tablet Take 25 mg by mouth daily.   1  . NUTRITIONAL SUPPLEMENT LIQD Take 120 mLs by mouth 2 (two) times daily. Med[employer]    .  polyethylene glycol (MIRALAX / GLYCOLAX) packet Take 17 g by mouth daily.    . vitamin B-12 1000 MCG tablet Take 1 tablet (1,000 mcg total) by mouth daily.      Home: Home Living Family/patient expects to be discharged to:: Skilled nursing facility  Functional History:   Functional Status:  Mobility:          ADL:    Cognition: Cognition Orientation Level: Oriented to person, Oriented to place, Disoriented to time, Disoriented to situation    Blood pressure 138/81, pulse (!) 104, temperature (!) 97.4 F (36.3 C), temperature source Oral, resp. rate 20, height 5\' 10"  (1.778 m), weight 49.8 kg, SpO2 98 %. Physical Exam  Constitutional: He appears well-developed.  Emaciated  HENT:  Head: Normocephalic and atraumatic.  Eyes: EOM are normal. Right eye exhibits no discharge. Left eye exhibits no discharge.  Neck: Normal range of motion. Neck supple.  Cardiovascular: Regular rhythm.  + Tachycardia  Respiratory: Effort normal and breath sounds normal.  GI: Soft. Bowel sounds are normal.  Musculoskeletal:  Right stump with edema and tenderness  Neurological: He is alert.  Alert and oriented x1 Motor: Bilateral upper extremities: 4/5 proximal distal Right lower extremity: 2+/5 hip flexion (pain inhibition) Left lower extremity: 3-/5 hip flexion, 4/5 knee extension  Skin:  Left stump with good healing Right stump with dressing C/D/I  Psychiatric:  Unable to assess due to dementia    Results for orders placed or performed during the hospital encounter of 03/30/18 (from the past 24 hour(s))  Glucose, capillary     Status: None   Collection Time: 04/04/18 10:18 AM  Result Value Ref Range   Glucose-Capillary 71 70 - 99 mg/dL  Glucose, capillary     Status: Abnormal   Collection Time: 04/04/18 11:40 AM  Result Value Ref Range   Glucose-Capillary 100 (H) 70 - 99 mg/dL   Comment 1 Notify RN    Comment 2 Document in Chart   Glucose, capillary     Status: Abnormal    Collection Time: 04/04/18 12:34 PM  Result Value Ref Range   Glucose-Capillary 106 (H) 70 - 99 mg/dL  Type and screen [facility]     Status: None (Preliminary result)   Collection Time: 04/04/18  3:00 PM  Result Value Ref Range   ABO/RH(D) A POS    Antibody Screen NEG    Sample Expiration 04/07/2018    Unit Number [id number]    Blood Component Type RED CELLS,LR    Unit division 00    Status of Unit ISSUED    Transfusion Status OK TO TRANSFUSE    Crossmatch Result      Compatible Performed at [facility], [address] [place], [place] [address]   Glucose, capillary     Status: Abnormal   Collection Time: 04/04/18  8:27 PM  Result Value Ref Range   Glucose-Capillary 119 (H) 70 - 99 mg/dL  Glucose, capillary     Status: Abnormal   Collection Time: 04/05/18 12:04 AM  Result Value Ref Range  Glucose-Capillary 110 (H) 70 - 99 mg/dL  CBC     Status: Abnormal   Collection Time: 04/05/18  3:58 AM  Result Value Ref Range   WBC 11.6 (H) 4.0 - 10.5 K/uL   RBC 2.75 (L) 4.22 - 5.81 MIL/uL   Hemoglobin 7.5 (L) 13.0 - 17.0 g/dL   HCT 46.9 (L) 62.9 - 52.8 %   MCV 84.4 80.0 - 100.0 fL   MCH 27.3 26.0 - 34.0 pg   MCHC 32.3 30.0 - 36.0 g/dL   RDW 41.3 (H) 24.4 - 01.0 %   Platelets 466 (H) 150 - 400 K/uL   nRBC 0.0 0.0 - 0.2 %  Magnesium     Status: Abnormal   Collection Time: 04/05/18  3:58 AM  Result Value Ref Range   Magnesium 1.5 (L) 1.7 - 2.4 mg/dL  Basic metabolic panel     Status: Abnormal   Collection Time: 04/05/18  3:58 AM  Result Value Ref Range   Sodium 134 (L) 135 - 145 mmol/L   Potassium 3.7 3.5 - 5.1 mmol/L   Chloride 108 98 - 111 mmol/L   CO2 20 (L) 22 - 32 mmol/L   Glucose, Bld 108 (H) 70 - 99 mg/dL   BUN 11 8 - 23 mg/dL   Creatinine, Ser 2.72 0.61 - 1.24 mg/dL   Calcium 7.9 (L) 8.9 - 10.3 mg/dL   GFR calc non Af Amer >60 >60 mL/min   GFR calc Af Amer >60 >60 mL/min   Anion gap 6 5 - 15  Glucose, capillary     Status: Abnormal    Collection Time: 04/05/18  4:25 AM  Result Value Ref Range   Glucose-Capillary 101 (H) 70 - 99 mg/dL  Prepare RBC     Status: None   Collection Time: 04/05/18  7:35 AM  Result Value Ref Range   Order Confirmation      ORDER PROCESSED BY BLOOD BANK Performed at [facility], [address] [place], [place] [address]    [place] Ekg Site Rite  Result Date: 04/03/2018 [facility] image not attached, placement could not be confirmed due to current cardiac rhythm.   Assessment/Plan: Diagnosis: Right BKA with recent history of left BKA Labs independently reviewed.  Records reviewed and summated above. Clean amputation daily with soap and water Monitor incision site for signs of infection or impending skin breakdown. Staples to remain in place for 3-4 weeks Stump shrinker, for edema control  Scar mobilization massaging to prevent soft tissue adherence Stump protector during therapies Prevent flexion contractures by implementing the following:   Encourage prone lying for 20-30 mins per day BID to avoid hip flexion  Contractures if medically appropriate;  Avoid pillow under knees when patient is lying in bed in order to prevent both  knee and hip flexion contractures;  Avoid prolonged sitting Post surgical pain control with oral medication Phantom limb pain control with physical modalities including desensitization techniques (gentle self massage to the residual stump,hot packs if sensation intact, [place]) and mirror therapy, TENS. If ineffective, consider pharmacological treatment for neuropathic pain (e.g gabapentin, pregabalin, amytriptalyine, duloxetine).  When using wheelchair, patient should have knee on amputated side fully extended with board under the seat cushion.  1. Does the need for close, 24 hr/day medical supervision in concert with the patient's rehab needs make it unreasonable for this patient to be served in a less intensive setting? Yes 2. Co-Morbidities requiring  supervision/potential complications: PVD (continue meds), HTN (monitor and  provide prns in accordance with increased physical exertion and pain), advanced dementia, Tachycardia (monitor in accordance with pain and increasing activity), postoperative pain (Biofeedback training with therapies to help reduce reliance on opiate pain medications, monitor pain control during therapies, and sedation at rest and titrate to maximum efficacy to ensure participation and gains in therapies), hyponatremia, leukocytosis (repeat labs, cont to monitor for signs and symptoms of infection, further workup if indicated), acute blood loss anemia 3. Due to bladder management, safety, skin/wound care, disease management, medication administration, pain management and patient education, does the patient require 24 hr/day rehab nursing? Yes 4. Does the patient require coordinated care of a physician, rehab nurse, PT (1-2 hrs/day, 5 days/week) and OT (1-2 hrs/day, 5 days/week) to address physical and functional deficits in the context of the above medical diagnosis(es)? Potentially Addressing deficits in the following areas: balance, endurance, locomotion, strength, transferring, bathing, dressing, toileting and psychosocial support 5. Can the patient actively participate in an intensive therapy program of at least 3 hrs of therapy per day at least 5 days per week? Potentially 6. The potential for patient to make measurable gains while on inpatient rehab is TBD 7. Anticipated functional outcomes upon discharge from inpatient rehab are TBD  with PT, TBD with OT, n/a with SLP. 8. Estimated rehab length of stay to reach the above functional goals is: TBD. 9. Anticipated D/C setting: SNF 10. Anticipated post D/C treatments: SNF 11. Overall Rehab/Functional Prognosis: good and fair  RECOMMENDATIONS: This patient's condition is appropriate for continued rehabilitative care in the following setting: Will await completion of therapy  evaluations however given dementia, lack of caregiver support and increased functional limitations, patient will likely require SNF.  Patient has agreed to participate in recommended program. Potentially Note that insurance prior authorization may be required for reimbursement for recommended care.  Comment: Rehab Admissions Coordinator to follow up.   Maryla MorrowAnkit , MD, ABPMR 04/05/2018

## 2018-04-05 NOTE — Progress Notes (Signed)
Patient Demographics:    Malik Stanton, is a 67 y.o. male, DOB - 09-Oct-1950, ZOX:[id number]  Admit date - 03/30/2018   Admitting Physician Alessandra Bevels, MD  Outpatient Primary MD for the patient is [facility]  LOS - 6   Chief Complaint  Patient presents with  . Blood Infection       Brief Summary   67 y.o. 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     Subjective:   Patient in bed, appears comfortable, denies any headache, no fever, no chest pain or pressure, no shortness of breath , no abdominal pain. No focal weakness.    Assessment  & Plan :    1)MSSA Bacteremia/Sepsis--- blood cultures from 03/30/2018 with MSSA , most likely secondary to right foot gangrene, infectious disease input appreciated, neurosurgery Dr. Myra Gianotti following the patient and he is due for right foot BKA on 04/04/2018.  Type screen and monitor.  Continue antibiotics for a total of 6 weeks, Stop Date of May 13, 2018 for presumed endocarditis as well, see #2 below.  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  3)Chronic Anemia--- Hgb is stable at 8.8 which is close to his recent baseline,  on admission was concerned about dark stools/possible GI bleed, no further bleeding noted despite being on IV  heparin for several days for PAD, GI consult from Dr. Levora Angel appreciated, no GI interventions planned, continue oral Protonix, he did drop some blood due to perioperative blood loss related anemia and will get 1 unit of packed RBC transfusion on 04/05/2018.  Will monitor.  4) FEN/Moderate Protein Caloric Malnutrition --- hypokalemia resolved with replacement, magnesium was normal, continue nutritional supplements  5)PAD--- postop we will continue  aspirin and Plavix,, right foot gangrene noted, , for right BKA on 04/04/2018  6) Social/Ethics--patient is DNR/DNI, plan of care discussed with patient granddaughter Ms Zaylen Susman  ([phone]) and patient's son Abdurrahman, [place]...Marland KitchenMarland KitchenMarland Kitchen granddaughter is primary contact, palliative care consult for GOC appreciated, family initially declined right BKA, patient is more awake now after further conversations with patient and granddaughter plan is for right BKA on 04/04/2018    7)ARF -due to hypotension resolved.  8) hypomagnesemia.  Replaced.    Disposition/Need for in-Hospital Stay - SNF  Code Status : DNR  Disposition Plan  : --- SNF with palliative care and iv Ancef for 6 weeks stop date May 12, 2018.     Consults  : Palliative care/vascular surgery/infectious disease  Procedure  R. BKA  04/04/18  TTE 04/02/18  - Cannot exclude small independently mobile lesions on the anterior mitral leaflet (ventricular aspect). Image quality is suboptimal to further assess.  DVT Prophylaxis  :  SQ Heparin   Lab Results  Component Value Date   PLT 466 (H) 04/05/2018    Inpatient Medications  Scheduled Meds: . sodium chloride   Intravenous Once  . sodium chloride   Intravenous Once  . aspirin EC  81 mg Oral Daily  . Chlorhexidine Gluconate Cloth  6 each Topical Daily  . clopidogrel  75 mg Oral Daily  . feeding supplement (ENSURE ENLIVE)  237 mL Oral BID BM  . fluconazole  200 mg Oral Daily  . heparin injection (subcutaneous)  5,000  Units Subcutaneous Q8H  . mouth rinse  15 mL Mouth Rinse BID  . mupirocin ointment  1 application Nasal BID  . nutrition supplement (JUVEN)  1 packet Oral BID BM  . pantoprazole  40 mg Oral BID  . polyethylene glycol  17 g Oral Daily  . sodium chloride flush  10-40 mL Intracatheter Q12H  . cyanocobalamin  1,000 mcg Oral Daily   Continuous Infusions: . sodium chloride Stopped (04/02/18 0649)  . sodium chloride 50 mL/hr at 04/04/18 2259  .  ceFAZolin (ANCEF) IV 2 g (04/05/18 0655)   PRN Meds:.sodium chloride, acetaminophen, acetaminophen, diphenhydrAMINE, LORazepam, methocarbamol, ondansetron **OR** ondansetron (ZOFRAN) IV, oxyCODONE, oxyCODONE-acetaminophen, sodium chloride flush    Anti-infectives (From admission, onward)   Start     Dose/Rate Route Frequency Ordered Stop   04/02/18 1400  ceFAZolin (ANCEF) IVPB 2g/100 mL premix     2 g 200 mL/hr over 30 Minutes Intravenous Every 8 hours 04/02/18 1105 05/13/18 2359   04/01/18 1530  fluconazole (DIFLUCAN) tablet 200 mg     200 mg Oral Daily 04/01/18 1524 04/06/18 0959   03/31/18 1530  ceFAZolin (ANCEF) IVPB 1 g/50 mL premix  Status:  Discontinued     1 g 100 mL/hr over 30 Minutes Intravenous Every 8 hours 03/31/18 1443 04/02/18 1105   03/31/18 1000  ceFEPIme (MAXIPIME) 1 g in sodium chloride 0.9 % 100 mL IVPB  Status:  Discontinued     1 g 200 mL/hr over 30 Minutes Intravenous Every 24 hours 03/30/18 1525 03/31/18 1324   03/30/18 1600  vancomycin (VANCOCIN) IVPB 1000 mg/200 mL premix     1,000 mg 200 mL/hr over 60 Minutes Intravenous  Once 03/30/18 1525 03/30/18 2012   03/30/18 1145  cefTRIAXone (ROCEPHIN) 2 g in sodium chloride 0.9 % 100 mL IVPB  Status:  Discontinued     2 g 200 mL/hr over 30 Minutes Intravenous Every 24 hours 03/30/18 1136 03/30/18 1525        Objective:   Vitals:   04/05/18 0007 04/05/18 0426 04/05/18 0851 04/05/18 0919  BP: 130/80 132/78 138/81 121/76  Pulse: (!) 110 (!) 105 (!) 104 (!) 121  Resp: 16  18 20  (!) 22  Temp: (!) 97.3 F (36.3 C) 97.6 F (36.4 C) (!) 97.4 F (36.3 C) 98.2 F (36.8 C)  TempSrc: Oral Oral Oral Axillary  SpO2: 95% 98% 98% 93%  Weight:      Height:        Wt Readings from Last 3 Encounters:  03/30/18 49.8 kg  03/29/18 49.9 kg  03/08/18 56.9 kg     Intake/Output Summary (Last 24 hours) at 04/05/2018 1046 Last data filed at 04/05/2018 0717 Gross per 24 hour  Intake 1843.44 ml  Output 280 ml  Net 1563.44 ml     Physical Exam  Awake Alert, Oriented X 3, No new F.N deficits, Normal affect [place].AT,PERRAL Supple Neck,No JVD, No cervical lymphadenopathy appriciated.  Symmetrical Chest  wall movement, Good air movement bilaterally, CTAB RRR,No Gallops, Rubs or new Murmurs, No Parasternal Heave +ve B.Sounds, Abd Soft, No tenderness, No organomegaly appriciated, No rebound - guarding or rigidity. No Cyanosis, Bilat.BKA   Data Review:   Micro Results Recent Results (from the past 240 hour(s))  Blood Culture (routine x 2)     Status: Abnormal   Collection Time: 03/30/18 12:06 PM  Result Value Ref Range Status   Specimen Description BLOOD RIGHT ANTECUBITAL  Final   Special Requests   Final    BOTTLES DRAWN AEROBIC AND ANAEROBIC Blood Culture adequate volume   Culture  Setup Time   Final    GRAM POSITIVE COCCI IN BOTH AEROBIC AND ANAEROBIC BOTTLES CRITICAL VALUE NOTED.  VALUE IS CONSISTENT WITH PREVIOUSLY REPORTED AND CALLED VALUE.    Culture (A)  Final    STAPHYLOCOCCUS AUREUS SUSCEPTIBILITIES PERFORMED ON PREVIOUS CULTURE WITHIN THE LAST 5 DAYS. Performed at [facility], [address], [place], [place] [address]    Report Status 04/02/2018 FINAL  Final  Blood Culture (routine x 2)     Status: Abnormal   Collection Time: 03/30/18 12:15 PM  Result Value Ref Range Status   Specimen Description BLOOD BLOOD RIGHT ARM  Final   Special Requests   Final    BOTTLES DRAWN AEROBIC AND ANAEROBIC Blood Culture adequate volume   Culture  Setup  Time   Final    GRAM POSITIVE COCCI IN BOTH AEROBIC AND ANAEROBIC BOTTLES CRITICAL RESULT CALLED TO, READ BACK BY AND VERIFIED WITHMelven Sartorius [facility] 6045 03/31/18 A BROWNING Performed at [facility], [address] [place], [place] [address]    Culture STAPHYLOCOCCUS AUREUS (A)  Final   Report Status 04/02/2018 FINAL  Final   Organism ID, Bacteria STAPHYLOCOCCUS AUREUS  Final      Susceptibility   Staphylococcus aureus - MIC*    CIPROFLOXACIN <=0.5 SENSITIVE Sensitive     ERYTHROMYCIN <=0.25 SENSITIVE Sensitive     GENTAMICIN <=0.5 SENSITIVE Sensitive     OXACILLIN 0.5 SENSITIVE Sensitive     TETRACYCLINE <=1 SENSITIVE Sensitive     VANCOMYCIN <=0.5 SENSITIVE Sensitive     TRIMETH/SULFA <=10 SENSITIVE Sensitive     CLINDAMYCIN <=0.25 SENSITIVE Sensitive     RIFAMPIN <=0.5 SENSITIVE Sensitive     Inducible Clindamycin NEGATIVE Sensitive     * STAPHYLOCOCCUS AUREUS  Blood Culture ID Panel (Reflexed)     Status: Abnormal   Collection Time: 03/30/18 12:15 PM  Result Value Ref Range Status   Enterococcus species NOT DETECTED NOT DETECTED Final   Listeria monocytogenes NOT DETECTED NOT DETECTED Final   Staphylococcus species DETECTED (A) NOT DETECTED Final    Comment: CRITICAL RESULT CALLED TO, READ BACK BY AND VERIFIED WITHShela Commons [facility] PHARMD 1914 03/31/18 A BROWNING    Staphylococcus aureus (BCID) DETECTED (A) NOT DETECTED Final    Comment: Methicillin (oxacillin) susceptible Staphylococcus aureus (MSSA). Preferred therapy is anti staphylococcal beta lactam antibiotic (Cefazolin or Nafcillin), unless clinically contraindicated. CRITICAL RESULT CALLED TO, READ BACK BY AND VERIFIED WITH: Melven Sartorius [facility] 7829 03/31/18 A BROWNING    Methicillin resistance NOT DETECTED NOT DETECTED Final   Streptococcus species NOT DETECTED NOT DETECTED Final   Streptococcus agalactiae NOT DETECTED NOT DETECTED Final   Streptococcus pneumoniae NOT DETECTED NOT DETECTED Final   Streptococcus  pyogenes NOT DETECTED NOT DETECTED Final   Acinetobacter baumannii NOT DETECTED NOT DETECTED Final   Enterobacteriaceae species NOT DETECTED NOT DETECTED Final  Enterobacter cloacae complex NOT DETECTED NOT DETECTED Final   Escherichia coli NOT DETECTED NOT DETECTED Final   Klebsiella oxytoca NOT DETECTED NOT DETECTED Final   Klebsiella pneumoniae NOT DETECTED NOT DETECTED Final   Proteus species NOT DETECTED NOT DETECTED Final   Serratia marcescens NOT DETECTED NOT DETECTED Final   Haemophilus influenzae NOT DETECTED NOT DETECTED Final   Neisseria meningitidis NOT DETECTED NOT DETECTED Final   Pseudomonas aeruginosa NOT DETECTED NOT DETECTED Final   Candida albicans NOT DETECTED NOT DETECTED Final   Candida glabrata NOT DETECTED NOT DETECTED Final   Candida krusei NOT DETECTED NOT DETECTED Final   Candida parapsilosis NOT DETECTED NOT DETECTED Final   Candida tropicalis NOT DETECTED NOT DETECTED Final    Comment: Performed at [facility], [address] [place], [place] [address]  Culture, blood (routine x 2)     Status: None (Preliminary result)   Collection Time: 04/01/18  7:09 AM  Result Value Ref Range Status   Specimen Description BLOOD RIGHT ARM  Final   Special Requests   Final    BOTTLES DRAWN AEROBIC AND ANAEROBIC Blood Culture adequate volume   Culture   Final    NO GROWTH 4 DAYS Performed at [facility], [address], [place], [place] [address]    Report Status PENDING  Incomplete  Culture, blood (routine x 2)     Status: None (Preliminary result)   Collection Time: 04/01/18  7:09 AM  Result Value Ref Range Status   Specimen Description BLOOD RIGHT ARM  Final   Special Requests   Final    BOTTLES DRAWN AEROBIC AND ANAEROBIC Blood Culture adequate volume   Culture   Final    NO GROWTH 4 DAYS Performed at [facility], [address], [place], [place] [address]    Report Status PENDING  Incomplete  Surgical PCR screen     Status: Abnormal    Collection Time: 04/03/18 10:19 PM  Result Value Ref Range Status   MRSA, PCR POSITIVE (A) NEGATIVE Final    Comment: RESULT CALLED TO, READ BACK BY AND VERIFIED WITH: Neville Route RN [id number] 04/04/18 A BROWNING    Staphylococcus aureus POSITIVE (A) NEGATIVE Final    Comment: (NOTE) The Xpert SA Assay (FDA approved for NASAL specimens in patients 27 years of age and older), is one component of a comprehensive surveillance program. It is not intended to diagnose infection nor to guide or monitor treatment. Performed at [facility], [address], [place], [place] [address]     Radiology Reports Dg Chest Port 1 View  Result Date: 03/30/2018 CLINICAL DATA:  Hypotension. Possible sepsis. EXAM: PORTABLE CHEST 1 VIEW COMPARISON:  02/17/2018. FINDINGS: Normal sized heart. Mildly tortuous and calcified thoracic aorta. Clear lungs with normal vascularity. Bilateral AC joint degenerative changes. IMPRESSION: No acute abnormality. Electronically Signed   By: Beckie Salts M.D.   On: 03/30/2018 11:57   [place] Ekg Site Rite  Result Date: 04/03/2018 If Site Rite image not attached, placement could not be confirmed due to current cardiac rhythm.    CBC Recent Labs  Lab 03/30/18 1206  04/01/18 0451 04/02/18 0631 04/03/18 0451 04/04/18 0334 04/05/18 0358  WBC 26.0*   < > 19.5* 21.6* 18.0* 15.4* 11.6*  HGB 8.3*   < > 8.2* 8.9* 8.8* 8.7* 7.5*  HCT 26.5*   < > 25.7* 28.2* 27.7* 27.9* 23.2*  PLT 482*   < > 389 491* 466* 517*  466*  MCV 84.4   < > 84.5 85.7 85.0 84.8 84.4  MCH 26.4   < > 27.0 27.1 27.0 26.4 27.3  MCHC 31.3   < > 31.9 31.6 31.8 31.2 32.3  RDW 17.0*   < > 16.6* 17.2* 17.4* 17.5* 17.7*  LYMPHSABS 1.0  --   --   --   --   --   --   MONOABS 1.0  --   --   --   --   --   --   EOSABS 0.0  --   --   --   --   --   --   BASOSABS 0.1  --   --   --   --   --   --    < > = values in this interval not displayed.    Chemistries  Recent Labs  Lab 03/30/18 1206 03/31/18 0210  03/31/18 2105 04/01/18 0451 04/03/18 0451 04/04/18 0334 04/05/18 0358  NA 136 138 141 141 139 137 134*  K 3.6 2.5* 2.7* 3.6 3.6 3.9 3.7  CL 105 108 111 116* 112* 109 108  CO2 17* 19* 19* 19* 20* 20* 20*  GLUCOSE 200* 167* 113* 141* 122* 108* 108*  BUN 80* 46* 23 22 12 13 11   CREATININE 2.42* 1.37* 1.16 1.12 0.98 0.86 0.82  CALCIUM 8.9 8.4* 8.2* 8.2* 8.4* 8.5* 7.9*  MG  --  2.2  --   --   --   --  1.5*  AST 129*  --   --  110*  --   --   --   ALT 124*  --   --  89*  --   --   --   ALKPHOS 246*  --   --  176*  --   --   --   BILITOT 0.5  --   --  1.3*  --   --   --    Coagulation profile Recent Labs  Lab 03/30/18 1220 04/04/18 0334  INR 1.46 1.13   Signature  Susa RaringPrashant  M.D on 04/05/2018 at 10:46 AM   -  To page go to [email or web address] - password [email or web address]

## 2018-04-06 LAB — CBC
HEMATOCRIT: 29 % — AB (ref 39.0–52.0)
Hemoglobin: 9.1 g/dL — ABNORMAL LOW (ref 13.0–17.0)
MCH: 26.7 pg (ref 26.0–34.0)
MCHC: 31.4 g/dL (ref 30.0–36.0)
MCV: 85 fL (ref 80.0–100.0)
NRBC: 0 % (ref 0.0–0.2)
Platelets: 519 10*3/uL — ABNORMAL HIGH (ref 150–400)
RBC: 3.41 MIL/uL — AB (ref 4.22–5.81)
RDW: 17.2 % — ABNORMAL HIGH (ref 11.5–15.5)
WBC: 12 10*3/uL — ABNORMAL HIGH (ref 4.0–10.5)

## 2018-04-06 LAB — TYPE AND SCREEN
ABO/RH(D): A POS
ANTIBODY SCREEN: NEGATIVE
Unit division: 0

## 2018-04-06 LAB — BASIC METABOLIC PANEL
Anion gap: 7 (ref 5–15)
BUN: 11 mg/dL (ref 8–23)
CHLORIDE: 106 mmol/L (ref 98–111)
CO2: 21 mmol/L — AB (ref 22–32)
Calcium: 8.4 mg/dL — ABNORMAL LOW (ref 8.9–10.3)
Creatinine, Ser: 0.72 mg/dL (ref 0.61–1.24)
GFR calc non Af Amer: >60 mL/min (ref >60)
Glucose, Bld: 83 mg/dL (ref 70–99)
POTASSIUM: 3.7 mmol/L (ref 3.5–5.1)
Sodium: 134 mmol/L — ABNORMAL LOW (ref 135–145)

## 2018-04-06 LAB — CULTURE, BLOOD (ROUTINE X 2)
CULTURE: NO GROWTH
Culture: NO GROWTH
SPECIAL REQUESTS: ADEQUATE
SPECIAL REQUESTS: ADEQUATE

## 2018-04-06 LAB — BPAM RBC
Blood Product Expiration Date: 201912192359
ISSUE DATE / TIME: 201911280853
UNIT TYPE AND RH: 6200

## 2018-04-06 LAB — MAGNESIUM: Magnesium: 1.9 mg/dL (ref 1.7–2.4)

## 2018-04-06 NOTE — Progress Notes (Signed)
Pt sacrum is found to have an large unstageable wound with no drainage this am.  Charge nurse brought in to see it.  Area cleaned and a meplilex pad applied.  Wound nurse consult will be requested after talking with the doctor.

## 2018-04-06 NOTE — Progress Notes (Signed)
Encouraged pt to sit on the side of the bed, pt did not participate, took a lot of assist to do this task.

## 2018-04-06 NOTE — Progress Notes (Signed)
[facility] notified that patient should be able to discharge tomorrow. They have insurance approval.   Cristobal Goldmannadia Fredrich Cory LCSW [phone]

## 2018-04-06 NOTE — Progress Notes (Signed)
Patient Demographics:    Malik Stanton, is a 67 y.o. male, DOB - 03/08/1951, WGN:[id number]  Admit date - 03/30/2018   Admitting Physician Alessandra Bevels, MD  Outpatient Primary MD for the patient is [facility]  LOS - 7   Chief Complaint  Patient presents with  . Blood Infection       Brief Summary   67 y.o. 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     Subjective:   Patient in bed, appears comfortable, denies any headache, no fever, no chest pain or pressure, no shortness of breath , no abdominal pain. No focal weakness.    Assessment  & Plan :    1)MSSA Bacteremia/Sepsis--- blood cultures from 03/30/2018 with MSSA , most likely secondary to right foot gangrene, infectious disease input appreciated, neurosurgery Dr. Myra Gianotti following the patient and he is due for right foot BKA on 04/04/2018.  Type screen and monitor.  Continue antibiotics for a total of 6 weeks, Stop Date of May 13, 2018 for presumed endocarditis as well, see #2 below.  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  3)Chronic Anemia--- Hgb is stable at 8.8 which is close to his recent baseline,  on admission was concerned about dark stools/possible GI bleed, no further bleeding noted despite being on IV  heparin for several days for PAD, GI consult from Dr. Levora Angel appreciated, no GI interventions planned, continue oral Protonix, he did have mild perioperative blood loss related worsening of his anemia on 04/05/2018 after which he received 1 unit of packed RBC on 04/05/2018 with stable posttransfusion H&H.  4) FEN/Moderate Protein Caloric Malnutrition --- hypokalemia resolved with replacement, magnesium was normal, continue nutritional supplements  5)PAD--- postop we will continue  aspirin and Plavix,, right foot gangrene noted, , he is post R. BKA on 04/04/2018  6) Social/Ethics--patient is DNR/DNI, plan of care discussed with patient granddaughter Ms Tamim Skog  ([phone]) and patient's son [name], [name]...Marland KitchenMarland KitchenMarland Kitchen granddaughter is primary contact, palliative care consult for GOC appreciated, family initially declined right BKA, patient is more awake now after further conversations with patient and granddaughter plan is for right BKA on 04/04/2018    7)ARF - due to hypotension resolved.  8) hypomagnesemia.  Replaced and stable.    Disposition/Need for in-Hospital Stay - SNF  Code Status : DNR  Disposition Plan  : --- SNF in am with palliative care and iv Ancef for 6 weeks stop date May 13, 2018.     Consults  : Palliative care/vascular surgery/infectious disease  Procedure  R. BKA  04/04/18  TTE 04/02/18  - Cannot exclude small independently mobile lesions on the anterior mitral leaflet (ventricular aspect). Image  quality is suboptimal to further assess.  DVT Prophylaxis  :  SQ Heparin   Lab Results  Component Value Date   PLT 519 (H) 04/06/2018    Inpatient Medications  Scheduled Meds: . aspirin EC  81 mg Oral Daily  . Chlorhexidine Gluconate Cloth  6 each Topical Daily  . clopidogrel  75 mg Oral Daily  . docusate sodium  200 mg Oral BID  . feeding supplement (ENSURE ENLIVE)  237 mL Oral BID BM  . heparin injection (subcutaneous)  5,000 Units Subcutaneous Q8H    . mouth rinse  15 mL Mouth Rinse BID  . metoprolol tartrate  25 mg Oral BID  . morphine  15 mg Oral Q12H  . mupirocin ointment  1 application Nasal BID  . nutrition supplement (JUVEN)  1 packet Oral BID BM  . pantoprazole  40 mg Oral BID  . polyethylene glycol  17 g Oral Daily  . cyanocobalamin  1,000 mcg Oral Daily   Continuous Infusions: .  ceFAZolin (ANCEF) IV 2 g (04/06/18 [id number])   PRN Meds:.acetaminophen, acetaminophen, diphenhydrAMINE, LORazepam, methocarbamol, ondansetron **OR** ondansetron (ZOFRAN) IV, oxyCODONE-acetaminophen    Anti-infectives (From admission, onward)   Start     Dose/Rate Route Frequency Ordered Stop   04/02/18 1400  ceFAZolin (ANCEF) IVPB 2g/100 mL premix     2 g 200 mL/hr over 30 Minutes Intravenous Every 8 hours 04/02/18 1105 05/13/18 2359   04/01/18 1530  fluconazole (DIFLUCAN) tablet 200 mg  Status:  Discontinued     200 mg Oral Daily 04/01/18 1524 04/05/18 1046   03/31/18 1530  ceFAZolin (ANCEF) IVPB 1 g/50 mL premix  Status:  Discontinued     1 g 100 mL/hr over 30 Minutes Intravenous Every 8 hours 03/31/18 1443 04/02/18 1105   03/31/18 1000  ceFEPIme (MAXIPIME) 1 g in sodium chloride 0.9 % 100 mL IVPB  Status:  Discontinued     1 g 200 mL/hr over 30 Minutes Intravenous Every 24 hours 03/30/18 1525 03/31/18 1324   03/30/18 1600  vancomycin (VANCOCIN) IVPB 1000 mg/200 mL premix     1,000 mg 200 mL/hr over 60 Minutes Intravenous  Once 03/30/18 1525 03/30/18 2012   03/30/18 1145  cefTRIAXone (ROCEPHIN) 2 g in sodium chloride 0.9 % 100 mL IVPB  Status:  Discontinued     2 g 200 mL/hr over 30 Minutes Intravenous Every 24 hours 03/30/18 1136 03/30/18 1525        Objective:   Vitals:   04/05/18 1500 04/05/18 2131 04/06/18 0020 04/06/18 0401  BP:  137/85 (!) 147/92 131/86  Pulse:  (!) 103 93 99  Resp:  (!) 22 14 (!) 24  Temp:  (!) 97.5 F (36.4 C) 97.7 F (36.5 C)   TempSrc:  Oral Oral   SpO2: 95% 99% 98% 95%  Weight:      Height:         Wt Readings from Last 3 Encounters:  03/30/18 49.8 kg  03/29/18 49.9 kg  03/08/18 56.9 kg     Intake/Output Summary (Last 24 hours) at 04/06/2018 1101 Last data filed at 04/06/2018 0405 Gross per 24 hour  Intake 400 ml  Output 300 ml  Net 100 ml     Physical Exam  Awake Alert, Oriented X 3, No new F.N deficits, Normal affect [place].AT,PERRAL Supple Neck,No JVD, No cervical lymphadenopathy appriciated.  Symmetrical Chest wall movement, Good air movement bilaterally, CTAB RRR,No Gallops, Rubs or new Murmurs, No Parasternal Heave +ve B.Sounds, Abd Soft, No  tenderness, No organomegaly appriciated, No rebound - guarding or rigidity. No Cyanosis,  Bilat.BKA, R Arm PICC   Data Review:   Micro Results Recent Results (from the past 240 hour(s))  Blood Culture (routine x 2)     Status: Abnormal   Collection Time: 03/30/18 12:06 PM  Result Value Ref Range Status   Specimen Description BLOOD RIGHT ANTECUBITAL  Final   Special Requests   Final    BOTTLES DRAWN AEROBIC AND ANAEROBIC Blood Culture adequate volume   Culture  Setup Time   Final    GRAM POSITIVE COCCI IN BOTH AEROBIC AND ANAEROBIC BOTTLES CRITICAL VALUE NOTED.  VALUE IS CONSISTENT WITH PREVIOUSLY REPORTED AND CALLED VALUE.    Culture (A)  Final    STAPHYLOCOCCUS AUREUS SUSCEPTIBILITIES PERFORMED ON PREVIOUS CULTURE WITHIN THE LAST 5 DAYS. Performed at [facility], [address], [place], [place] [address]    Report Status 04/02/2018 FINAL  Final  Blood Culture (routine x 2)     Status: Abnormal   Collection Time: 03/30/18 12:15 PM  Result Value Ref Range Status   Specimen Description BLOOD BLOOD RIGHT ARM  Final   Special Requests   Final    BOTTLES DRAWN AEROBIC AND ANAEROBIC Blood Culture adequate volume   Culture  Setup Time   Final    GRAM POSITIVE COCCI IN BOTH AEROBIC AND ANAEROBIC BOTTLES CRITICAL RESULT CALLED TO, READ BACK BY AND VERIFIED WITHMelven Sartorius [facility] 1914 03/31/18 A  BROWNING Performed at [facility], [address], [place], [place] [address]    Culture STAPHYLOCOCCUS AUREUS (A)  Final   Report Status 04/02/2018 FINAL  Final   Organism ID, Bacteria STAPHYLOCOCCUS AUREUS  Final      Susceptibility   Staphylococcus aureus - MIC*    CIPROFLOXACIN <=0.5 SENSITIVE Sensitive     ERYTHROMYCIN <=0.25 SENSITIVE Sensitive     GENTAMICIN <=0.5 SENSITIVE Sensitive     OXACILLIN 0.5 SENSITIVE Sensitive     TETRACYCLINE <=1 SENSITIVE Sensitive     VANCOMYCIN <=0.5 SENSITIVE Sensitive     TRIMETH/SULFA <=10 SENSITIVE Sensitive     CLINDAMYCIN <=0.25 SENSITIVE Sensitive     RIFAMPIN <=0.5 SENSITIVE Sensitive     Inducible Clindamycin NEGATIVE Sensitive     * STAPHYLOCOCCUS AUREUS  Blood Culture ID Panel (Reflexed)     Status: Abnormal   Collection Time: 03/30/18 12:15 PM  Result Value Ref Range Status   Enterococcus species NOT DETECTED NOT DETECTED Final   Listeria monocytogenes NOT DETECTED NOT DETECTED Final   Staphylococcus species DETECTED (A) NOT DETECTED Final    Comment: CRITICAL RESULT CALLED TO, READ BACK BY AND VERIFIED WITHShela Commons [facility] PHARMD [id number] 03/31/18 A BROWNING    Staphylococcus aureus (BCID) DETECTED (A) NOT DETECTED Final    Comment: Methicillin (oxacillin) susceptible Staphylococcus aureus (MSSA). Preferred therapy is anti staphylococcal beta lactam antibiotic (Cefazolin or Nafcillin), unless clinically contraindicated. CRITICAL RESULT CALLED TO, READ BACK BY AND VERIFIED WITH: Melven Sartorius [facility] 0865 03/31/18 A BROWNING    Methicillin resistance NOT DETECTED NOT DETECTED Final   Streptococcus species NOT DETECTED NOT DETECTED Final   Streptococcus agalactiae NOT DETECTED NOT DETECTED Final   Streptococcus pneumoniae NOT DETECTED NOT DETECTED Final   Streptococcus pyogenes NOT DETECTED NOT DETECTED Final   Acinetobacter baumannii NOT DETECTED NOT DETECTED Final   Enterobacteriaceae species NOT DETECTED NOT DETECTED Final    Enterobacter cloacae complex NOT DETECTED NOT DETECTED Final   Escherichia coli NOT DETECTED NOT  DETECTED Final   Klebsiella oxytoca NOT DETECTED NOT DETECTED Final   Klebsiella pneumoniae NOT DETECTED NOT DETECTED Final   Proteus species NOT DETECTED NOT DETECTED Final   Serratia marcescens NOT DETECTED NOT DETECTED Final   Haemophilus influenzae NOT DETECTED NOT DETECTED Final   Neisseria meningitidis NOT DETECTED NOT DETECTED Final   Pseudomonas aeruginosa NOT DETECTED NOT DETECTED Final   Candida albicans NOT DETECTED NOT DETECTED Final   Candida glabrata NOT DETECTED NOT DETECTED Final   Candida krusei NOT DETECTED NOT DETECTED Final   Candida parapsilosis NOT DETECTED NOT DETECTED Final   Candida tropicalis NOT DETECTED NOT DETECTED Final    Comment: Performed at [facility], [address], [place], [place] [address]  Culture, blood (routine x 2)     Status: None   Collection Time: 04/01/18  7:09 AM  Result Value Ref Range Status   Specimen Description BLOOD RIGHT ARM  Final   Special Requests   Final    BOTTLES DRAWN AEROBIC AND ANAEROBIC Blood Culture adequate volume   Culture   Final    NO GROWTH 5 DAYS Performed at [facility], [address] [place], [place] [address]    Report Status 04/06/2018 FINAL  Final  Culture, blood (routine x 2)     Status: None   Collection Time: 04/01/18  7:09 AM  Result Value Ref Range Status   Specimen Description BLOOD RIGHT ARM  Final   Special Requests   Final    BOTTLES DRAWN AEROBIC AND ANAEROBIC Blood Culture adequate volume   Culture   Final    NO GROWTH 5 DAYS Performed at [facility], [address] [place], [place] [address]    Report Status 04/06/2018 FINAL  Final  Surgical PCR screen     Status: Abnormal   Collection Time: 04/03/18 10:19 PM  Result Value Ref Range Status   MRSA, PCR POSITIVE (A) NEGATIVE Final    Comment: RESULT CALLED TO, READ BACK BY AND VERIFIED WITH: Neville Route RN [id number] 04/04/18 A  BROWNING    Staphylococcus aureus POSITIVE (A) NEGATIVE Final    Comment: (NOTE) The Xpert SA Assay (FDA approved for NASAL specimens in patients 30 years of age and older), is one component of a comprehensive surveillance program. It is not intended to diagnose infection nor to guide or monitor treatment. Performed at [facility], [address], [place], [place] [address]     Radiology Reports Dg Chest Port 1 View  Result Date: 03/30/2018 CLINICAL DATA:  Hypotension. Possible sepsis. EXAM: PORTABLE CHEST 1 VIEW COMPARISON:  02/17/2018. FINDINGS: Normal sized heart. Mildly tortuous and calcified thoracic aorta. Clear lungs with normal vascularity. Bilateral AC joint degenerative changes. IMPRESSION: No acute abnormality. Electronically Signed   By: Beckie Salts M.D.   On: 03/30/2018 11:57   [place] Ekg Site Rite  Result Date: 04/03/2018 If Site Rite image not attached, placement could not be confirmed due to current cardiac rhythm.    CBC Recent Labs  Lab 03/30/18 1206  04/02/18 0631 04/03/18 0451 04/04/18 0334 04/05/18 0358 04/05/18 1608 04/06/18 0422  WBC 26.0*   < > 21.6* 18.0* 15.4* 11.6*  --  12.0*  HGB 8.3*   < > 8.9* 8.8* 8.7* 7.5* 9.1* 9.1*  HCT 26.5*   < > 28.2* 27.7* 27.9* 23.2* 27.5* 29.0*  PLT 482*   < > 491* 466* 517* 466*  --  519*  MCV 84.4   < >  85.7 85.0 84.8 84.4  --  85.0  MCH 26.4   < > 27.1 27.0 26.4 27.3  --  26.7  MCHC 31.3   < > 31.6 31.8 31.2 32.3  --  31.4  RDW 17.0*   < > 17.2* 17.4* 17.5* 17.7*  --  17.2*  LYMPHSABS 1.0  --   --   --   --   --   --   --   MONOABS 1.0  --   --   --   --   --   --   --   EOSABS 0.0  --   --   --   --   --   --   --   BASOSABS 0.1  --   --   --   --   --   --   --    < > = values in this interval not displayed.    Chemistries  Recent Labs  Lab 03/30/18 1206 03/31/18 0210  04/01/18 0451 04/03/18 0451 04/04/18 0334 04/05/18 0358 04/06/18 0422  NA 136 138   < > 141 139 137 134* 134*  K 3.6 2.5*   <  > 3.6 3.6 3.9 3.7 3.7  CL 105 108   < > 116* 112* 109 108 106  CO2 17* 19*   < > 19* 20* 20* 20* 21*  GLUCOSE 200* 167*   < > 141* 122* 108* 108* 83  BUN 80* 46*   < > 22 12 13 11 11   CREATININE 2.42* 1.37*   < > 1.12 0.98 0.86 0.82 0.72  CALCIUM 8.9 8.4*   < > 8.2* 8.4* 8.5* 7.9* 8.4*  MG  --  2.2  --   --   --   --  1.5* 1.9  AST 129*  --   --  110*  --   --   --   --   ALT 124*  --   --  89*  --   --   --   --   ALKPHOS 246*  --   --  176*  --   --   --   --   BILITOT 0.5  --   --  1.3*  --   --   --   --    < > = values in this interval not displayed.   Coagulation profile Recent Labs  Lab 03/30/18 1220 04/04/18 0334  INR 1.46 1.13   Signature  Susa Raring M.D on 04/06/2018 at 11:01 AM   -  To page go to [email or web address] - password [facility]

## 2018-04-06 NOTE — Progress Notes (Signed)
OT Evaluation  Clinical Impression: Pt with decreased cognition, generalized weakness, and decreased activity tolerance. Decreased access to LB for ADL with new BKA. Pt pleasant and wants to work with therapy. Pt able to come to EOB with Max A and heavy use of BR - unable to maintain without significant assist from OT for functional grooming task. Pt will require SNF level care post-acute to maximize safety and independence in ADL. Next session to focus on seated balance and compensatory strategies for LB ADL.    04/06/18 1300  OT Visit Information  Last OT Received On 04/06/18  Assistance Needed +2  History of Present Illness Pt is a 67 y.o. 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   Precautions  Precautions Fall  Precaution Comments bilateral transtibial amputations; R on 04/04/18; L on 03/02/18  Restrictions  Weight Bearing Restrictions Yes  RLE Weight Bearing NWB  Home Living  Family/patient expects to be discharged to: Skilled nursing facility  Prior Function  Level of Independence Needs assistance  Comments Pt unreliable historian  Communication  Communication Other (comment) (delayed responses)  Pain Assessment  Pain Assessment No/denies pain  Faces Pain Scale 0  Pain Intervention(s) Monitored during session  Cognition  Arousal/Alertness Awake/alert  Behavior During Therapy Flat affect  Overall Cognitive Status No family/caregiver present to determine baseline cognitive functioning  Area of Impairment Attention;Memory;Following commands;Safety/judgement;Awareness;Problem solving  Current Attention Level Sustained  Memory Decreased short-term memory;Decreased recall of precautions  Following Commands Follows one step commands inconsistently;Follows one step commands with increased time  Safety/Judgement Decreased awareness of  deficits;Decreased awareness of safety  Awareness Intellectual  Problem Solving Slow processing;Decreased initiation;Difficulty sequencing;Requires verbal cues;Requires tactile cues  Upper Extremity Assessment  Upper Extremity Assessment Generalized weakness  Lower Extremity Assessment  Lower Extremity Assessment Defer to PT evaluation  ADL  Overall ADL's  Needs assistance/impaired  Eating/Feeding Set up;Bed level  Grooming Wash/dry hands;Wash/dry face;Oral care;Set up;Bed level  Grooming Details (indicate cue type and reason) in bed, able to complete with simple commands and HOB elevated  Upper Body Bathing Moderate assistance  Lower Body Bathing Moderate assistance  Upper Body Dressing  Maximal assistance  Lower Body Dressing Total assistance  General ADL Comments decreased cognition, balance, activity tolerance, increased generalized weakness  Bed Mobility  Overal bed mobility Needs Assistance  Bed Mobility Supine to Sit;Sit to Supine  Supine to sit Max assist  Sit to supine Max assist  General bed mobility comments increased time and effort, multimodal cueing throughout for sequencing/technique; max A for trunk management to achieve long sitting in bed with pt using bilateral UEs on bed rails  Transfers  General transfer comment NT this session - unsafe without 2nd person  Balance  Overall balance assessment Needs assistance  Sitting-balance support Bilateral upper extremity supported  Sitting balance-Leahy Scale Poor  Sitting balance - Comments max A and bilateral UE supports to maintain upright sitting  Postural control Posterior lean  General Comments  General comments (skin integrity, edema, etc.) no family present throughout session to assist with PLOF  OT - End of Session  Activity Tolerance Patient tolerated treatment well;Other (comment) (limited to bedside evaluation due to lack of second assist)  Patient left in bed;with call bell/phone within reach;with bed alarm  set  Nurse Communication Mobility status  OT Assessment  OT Recommendation/Assessment Patient needs continued OT Services  OT Visit Diagnosis Unsteadiness on feet (  R26.81);Other abnormalities of gait and mobility (R26.89);Other symptoms and signs involving cognitive function;Muscle weakness (generalized) (M62.81)  OT Problem List Decreased activity tolerance;Impaired balance (sitting and/or standing);Decreased cognition;Decreased safety awareness;Decreased knowledge of use of DME or AE  OT Plan  OT Frequency (ACUTE ONLY) Min 2X/week  OT Treatment/Interventions (ACUTE ONLY) Self-care/ADL training;Therapeutic exercise;DME and/or AE instruction;Therapeutic activities;Patient/family education;Balance training  AM-PAC OT "6 Clicks" Daily Activity Outcome Measure (Version 2)  Help from another person eating meals? 3  Help from another person taking care of personal grooming? 3  Help from another person toileting, which includes using toliet, bedpan, or urinal? 2  Help from another person bathing (including washing, rinsing, drying)? 2  Help from another person to put on and taking off regular upper body clothing? 2  Help from another person to put on and taking off regular lower body clothing? 1  6 Click Score 13  OT Recommendation  Follow Up Recommendations SNF;Supervision/Assistance - 24 hour  Individuals Consulted  Consulted and Agree with Results and Recommendations Patient  Acute Rehab OT Goals  Patient Stated Goal unable to state; acknowledged need for rehab  OT Goal Formulation With patient  Time For Goal Achievement 04/20/18  Potential to Achieve Goals Fair  OT Time Calculation  OT Start Time (ACUTE ONLY) 1620  OT Stop Time (ACUTE ONLY) 1631  OT Time Calculation (min) 11 min  OT General Charges  $OT Visit 1 Visit  OT Evaluation  $OT Eval Moderate Complexity 1 Mod   Sherryl Manges OTR/L Acute Rehabilitation Services Pager: [phone] Office: [phone]

## 2018-04-06 NOTE — Plan of Care (Signed)

## 2018-04-06 NOTE — Evaluation (Signed)
Physical Therapy Evaluation Patient Details Name: Malik Stanton MRN: [id number] DOB: November 03, 1950 Today's Date: 04/06/2018   History of Present Illness  Pt is a 67 y.o. 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     Clinical Impression  Pt presented supine in bed with HOB elevated, awake and willing to participate in therapy session. Pt poor historian with no family/caregivers present to provide any reliable information. Pt currently requires max A for bed mobility and max A to maintain upright sitting in bed. Pt very limited this session secondary to weakness and fatigue. Pt would continue to benefit from skilled physical therapy services at this time while admitted and after d/c to address the below listed limitations in order to improve overall safety and independence with functional mobility.     Follow Up Recommendations SNF    Equipment Recommendations  None recommended by PT    Recommendations for Other Services       Precautions / Restrictions Precautions Precautions: Fall Precaution Comments: bilateral transtibial amputations; R on 04/04/18; L on 03/02/18 Restrictions Weight Bearing Restrictions: Yes RLE Weight Bearing: Non weight bearing      Mobility  Bed Mobility Overal bed mobility: Needs Assistance Bed Mobility: Supine to Sit;Sit to Supine     Supine to sit: Max assist Sit to supine: Max assist   General bed mobility comments: increased time and effort, multimodal cueing throughout for sequencing/technique; max A for trunk management to achieve long sitting in bed with pt using bilateral UEs on bed rails  Transfers                    Ambulation/Gait                Stairs            Wheelchair Mobility    Modified Rankin (Stroke Patients Only)       Balance Overall balance assessment: Needs  assistance Sitting-balance support: Bilateral upper extremity supported Sitting balance-Leahy Scale: Poor Sitting balance - Comments: max A and bilateral UE supports to maintain upright sitting Postural control: Posterior lean                                   Pertinent Vitals/Pain Pain Assessment: No/denies pain    Home Living Family/patient expects to be discharged to:: Skilled nursing facility                      Prior Function Level of Independence: Needs assistance   Gait / Transfers Assistance Needed: pt reported that he was ambulating with no AD; however, unsure how accurate that information was as pt did not state whether he had a prosthesis for the L LE when asked; no family/caregivers present to provide any reliable information           Hand Dominance        Extremity/Trunk Assessment   Upper Extremity Assessment Upper Extremity Assessment: Generalized weakness    Lower Extremity Assessment Lower Extremity Assessment: Generalized weakness;RLE deficits/detail;LLE deficits/detail RLE Deficits / Details: transtibial amputation with distal aspect of residual limb wrapped in ACE wrap; MMT revealed 3/5 for hip flexion, knee extension and knee flexion; pt with active knee extension ROM to lacking 5 degrees to neutral LLE Deficits / Details: previous transtibial amputation; MMT revealed 3/5  for hip flexion, knee extension and knee flexion; pt with active knee extension to 0 degrees       Communication   Communication: Other (comment)(delayed responses)  Cognition Arousal/Alertness: Awake/alert Behavior During Therapy: Flat affect Overall Cognitive Status: No family/caregiver present to determine baseline cognitive functioning Area of Impairment: Attention;Memory;Following commands;Safety/judgement;Awareness;Problem solving                   Current Attention Level: Sustained Memory: Decreased short-term memory;Decreased recall of  precautions Following Commands: Follows one step commands inconsistently;Follows one step commands with increased time Safety/Judgement: Decreased awareness of deficits;Decreased awareness of safety Awareness: Intellectual Problem Solving: Slow processing;Decreased initiation;Difficulty sequencing;Requires verbal cues;Requires tactile cues        General Comments      Exercises Amputee Exercises Hip ABduction/ADduction: AROM;Right;Supine Knee Flexion: AROM;Right;Left;Supine Knee Extension: AROM;Right;Left;Supine   Assessment/Plan    PT Assessment Patient needs continued PT services  PT Problem List Decreased strength;Decreased range of motion;Decreased activity tolerance;Decreased balance;Decreased mobility;Decreased coordination;Decreased cognition;Decreased knowledge of use of DME;Decreased safety awareness;Decreased knowledge of precautions       PT Treatment Interventions DME instruction;Functional mobility training;Therapeutic activities;Therapeutic exercise;Balance training;Neuromuscular re-education;Cognitive remediation;Patient/family education    PT Goals (Current goals can be found in the Care Plan section)  Acute Rehab PT Goals Patient Stated Goal: unable to state; acknowledged need for rehab PT Goal Formulation: Patient unable to participate in goal setting Time For Goal Achievement: 04/20/18 Potential to Achieve Goals: Fair    Frequency Min 3X/week   Barriers to discharge        Co-evaluation               AM-PAC PT "6 Clicks" Mobility  Outcome Measure Help needed turning from your back to your side while in a flat bed without using bedrails?: A Lot Help needed moving from lying on your back to sitting on the side of a flat bed without using bedrails?: A Lot Help needed moving to and from a bed to a chair (including a wheelchair)?: Total Help needed standing up from a chair using your arms (e.g., wheelchair or bedside chair)?: Total Help needed to  walk in hospital room?: Total Help needed climbing 3-5 steps with a railing? : Total 6 Click Score: 8    End of Session   Activity Tolerance: Patient limited by fatigue Patient left: in bed;with call bell/phone within reach;with bed alarm set Nurse Communication: Mobility status PT Visit Diagnosis: Other abnormalities of gait and mobility (R26.89);Muscle weakness (generalized) (M62.81)    Time: [id number] PT Time Calculation (min) (ACUTE ONLY): 13 min   Charges:   PT Evaluation $PT Eval Moderate Complexity: 1 Mod          Deborah ChalkJennifer Jamia Hoban, PT, DPT  Acute Rehabilitation Services Pager [phone] Office [phone]    Alessandra BevelsJennifer M Gilford Lardizabal 04/06/2018, 10:27 AM

## 2018-04-06 NOTE — Progress Notes (Signed)
Dressing changed on the right stump due to blood drainage.  Assessment noted that the staples are in place and some blood is oozing in between two of the staplers.  Charge nurse made aware and came and looked at the drainage and agreed to rewrap the surgical site.

## 2018-04-06 NOTE — Progress Notes (Signed)
Inpatient Rehabilitation-Admissions Coordinator   Noted therapies recommending SNF with [facility]. AC will sign off.   Nanine MeansKelly Hadia Minier, OTR/L  Rehab Admissions Coordinator  [phone] 04/06/2018 6:42 PM

## 2018-04-06 NOTE — Progress Notes (Addendum)
  Progress Note    04/06/2018 8:57 AM 2 Days Post-Op  Subjective:  Denies any pain   Afebrile  Vitals:   04/06/18 0020 04/06/18 0401  BP: (!) 147/92 131/86  Pulse: 93 99  Resp: 14 (!) 24  Temp: 97.7 F (36.5 C)   SpO2: 98% 95%    Physical Exam: Incisions:  Bandage is clean and dry.  Upper part of bed soaked in urine.   CBC    Component Value Date/Time   WBC 12.0 (H) 04/06/2018 0422   RBC 3.41 (L) 04/06/2018 0422   HGB 9.1 (L) 04/06/2018 0422   HCT 29.0 (L) 04/06/2018 0422   PLT 519 (H) 04/06/2018 0422   MCV 85.0 04/06/2018 0422   MCH 26.7 04/06/2018 0422   MCHC 31.4 04/06/2018 0422   RDW 17.2 (H) 04/06/2018 0422   LYMPHSABS 1.0 03/30/2018 1206   MONOABS 1.0 03/30/2018 1206   EOSABS 0.0 03/30/2018 1206   BASOSABS 0.1 03/30/2018 1206    BMET    Component Value Date/Time   NA 134 (L) 04/06/2018 0422   K 3.7 04/06/2018 0422   CL 106 04/06/2018 0422   CO2 21 (L) 04/06/2018 0422   GLUCOSE 83 04/06/2018 0422   BUN 11 04/06/2018 0422   CREATININE 0.72 04/06/2018 0422   CALCIUM 8.4 (L) 04/06/2018 0422   GFRNONAA >60 04/06/2018 0422   GFRAA >60 04/06/2018 0422    INR    Component Value Date/Time   INR 1.13 04/04/2018 0334     Intake/Output Summary (Last 24 hours) at 04/06/2018 0857 Last data filed at 04/06/2018 0405 Gross per 24 hour  Intake 518 ml  Output 300 ml  Net 218 ml     Assessment/Plan:  67 y.o. male is s/p right below knee amputation  2 Days Post-Op  -dressing not removed at time of visit due to the upper part of the bed being soaked in urine.  Bed being changed and will take down dressing later.  Doreatha MassedSamantha Rhyne, PA-C [facility] [phone] 04/06/2018 8:57 AM  Addendum:  Pt's dressing removed and incision looks good and healing nicely.  New ace wrap placed.  Pt tolerated well.  Doreatha MassedSamantha Rhyne, [facility] 04/06/2018 11:30 AM   I have independently interviewed patient and agree with PA assessment and plan above.  Dry dressing to protect wound. Vascular will be available as needed over weekend.   Brandon C. Randie Heinzain, MD [facility] [place] Office: [phone] Pager: [phone]

## 2018-04-06 NOTE — Clinical Social Work Note (Signed)
Clinical Social Work Assessment  Patient Details  Name: Malik Stanton MRN: [id number] Date of Birth: 02-15-1951  Date of referral:  04/06/18               Reason for consult:  Facility Placement                Permission sought to share information with:  Facility Medical sales representativeContact Representative, Family Supports Permission granted to share information::  Yes, Verbal Permission Granted  Name::     [place]  Agency::  [facility]  Relationship::  Granddaughter  Contact Information:  [phone]  Housing/Transportation Living arrangements for the past 2 months:  Skilled Nursing Facility Source of Information:  Other (Comment Required)(Granddaughter) Patient Interpreter Needed:  None Criminal Activity/Legal Involvement Pertinent to Current Situation/Hospitalization:  No - Comment as needed Significant Relationships:  Adult Children, Other Family Members Lives with:  Facility Resident Do you feel safe going back to the place where you live?  Yes Need for family participation in patient care:  Yes (Comment)  Care giving concerns:  CSW received consult for possible SNF placement at time of discharge. CSW spoke with patient's granddaughter regarding PT recommendation of SNF placement at time of discharge. Patient's granddaughter reported that [facility] and will [facility] on discharge. CSW to continue to follow and assist with discharge planning needs.   Social Worker assessment / plan:  CSW spoke with patient's granddaughter concerning return to SNF.  Employment status:  Retired Database administratornsurance information:  Managed Medicare PT Recommendations:  Skilled Nursing Facility Information / Referral to community resources:  Skilled Nursing Facility  Patient/Family's Response to care:  Patient's granddaughter reports agreement with plan and [facility]. CSW let their admissions liaison know. Patient's granddaughter also requested a medical update. MD  paged.  Patient/Family's Understanding of and Emotional Response to Diagnosis, Current Treatment, and Prognosis:  Patient/family is realistic regarding therapy needs and expressed being hopeful for return to SNF placement. Patient's granddaughter expressed understanding of CSW role and discharge process as well as medical condition. No questions/concerns about plan or treatment.    Emotional Assessment Appearance:  Appears stated age Attitude/Demeanor/Rapport:  Unable to Assess Affect (typically observed):  Unable to Assess Orientation:  Oriented to Self, Oriented to Place Alcohol / Substance use:  Not Applicable Psych involvement (Current and /or in the community):  No (Comment)  Discharge Needs  Concerns to be addressed:  Care Coordination Readmission within the last 30 days:  Yes Current discharge risk:  None Barriers to Discharge:  Continued Medical Work up   [employer] S Curlie Macken, LCSW 04/06/2018, 11:50 AM

## 2018-04-07 MED ORDER — HEPARIN SOD (PORK) LOCK FLUSH 100 UNIT/ML IV SOLN
250.0000 [IU] | INTRAVENOUS | Status: AC | PRN
  Administered 2018-04-07: 250 [IU]

## 2018-04-07 MED ORDER — HYDROCODONE-ACETAMINOPHEN 5-325 MG PO TABS: 1.0000 | ORAL_TABLET | Freq: Three times a day (TID) | ORAL | 0 refills | Status: AC | PRN

## 2018-04-07 MED ORDER — DOCUSATE SODIUM 100 MG PO CAPS: 100.0000 mg | ORAL_CAPSULE | Freq: Two times a day (BID) | ORAL | 0 refills | Status: AC

## 2018-04-07 MED ORDER — CEFAZOLIN IV (FOR PTA / DISCHARGE USE ONLY): 2.0000 g | Freq: Three times a day (TID) | INTRAVENOUS | 0 refills | Status: AC

## 2018-04-07 MED ORDER — HYDROCODONE-ACETAMINOPHEN 5-325 MG PO TABS: 1.0000 | ORAL_TABLET | Freq: Three times a day (TID) | ORAL | 0 refills | Status: DC | PRN

## 2018-04-07 MED ORDER — PANTOPRAZOLE SODIUM 40 MG PO TBEC: 40.0000 mg | DELAYED_RELEASE_TABLET | Freq: Every day | ORAL | Status: AC

## 2018-04-07 NOTE — Discharge Instructions (Signed)
Follow with Primary MD [facility] in 7 days   Get CBC, CMP checked  by Primary MD in 5-7 days    Activity: As tolerated with Full fall precautions use walker/cane & assistance as needed  Disposition SNF  Diet: Heart Healthy  with feeding assistance and aspiration precautions.   Special Instructions: If you have smoked or chewed Tobacco  in the last 2 yrs please stop smoking, stop any regular Alcohol  and or any Recreational drug use.  On your next visit with your primary care physician please Get Medicines reviewed and adjusted.  Please request your Prim.MD to go over all Hospital Tests and Procedure/Radiological results at the follow up, please get all Hospital records sent to your Prim MD by signing hospital release before you go home.  If you experience worsening of your admission symptoms, develop shortness of breath, life threatening emergency, suicidal or homicidal thoughts you must seek medical attention immediately by calling 911 or calling your MD immediately  if symptoms less severe.  You Must read complete instructions/literature along with all the possible adverse reactions/side effects for all the Medicines you take and that have been prescribed to you. Take any new Medicines after you have completely understood and accpet all the possible adverse reactions/side effects.

## 2018-04-07 NOTE — Discharge Summary (Signed)
Malik Stanton VWU:[id number] DOB: May 06, 1951 DOA: 03/30/2018  PCP: [place] date: 03/30/2018  Discharge date: 04/07/2018  Admitted From: SNF   Disposition:  SNF   Recommendations for Outpatient Follow-up:   Follow up with PCP in 1-2 weeks  PCP Please obtain BMP/CBC, 2 view CXR in 1week,  (see Discharge instructions)   PCP Please follow up on the following pending results: None   Home Health: None   Equipment/Devices: None  Consultations: VVS Discharge Condition: Fair   CODE STATUS: DNR   Diet Recommendation: Heart Healthy      Chief Complaint  Patient presents with  . 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                                                                 Hospital Course    1) MSSA Bacteremia/Sepsis--- blood cultures from 03/30/2018 with MSSA , most likely secondary to right foot gangrene, infectious disease input appreciated, Vas.Surgery Dr. 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 Continue antibiotics for a total of 6 weeks, Stop Date of May 13, 2018 for presumed endocarditis as well, see #2 below.  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  3) Chronic Anemia--- Hgb is stable at 8.8 which is close to his recent baseline,  on admission was concerned about dark stools/possible GI bleed, no further bleeding noted despite being on IV heparin for several days for PAD, GI consult from Dr. Alessandra Bevels appreciated, no GI interventions planned, continue oral Protonix, he did have mild perioperative blood loss related worsening of his anemia on 04/05/2018 after which he received 1 unit of packed RBC on 04/05/2018 with stable posttransfusion H&H.  4) FEN/Moderate Protein Caloric Malnutrition --- hypokalemia resolved with replacement, magnesium was normal, continue nutritional supplements  5) PAD--- postop we will continue  aspirin and Plavix.  6) ARF - due to hypotension resolved.     Discharge diagnosis     Active Problems:   Gangrene of foot ([place])   Sepsis ([place])   Palliative care by specialist  Goals of care, counseling/discussion   Acute GI bleeding   AKI (acute kidney injury) ([place])   Endocarditis   Cellulitis of right lower extremity   Protein-calorie malnutrition, severe   Advanced dementia ([facility])   Sinus tachycardia   Postoperative pain   Hyponatremia   Leukocytosis   Acute blood loss anemia    Discharge instructions    Discharge Instructions    Diet - low sodium heart healthy   Complete by:  As directed    Discharge instructions   Complete by:  As directed    Follow with Primary MD [place] in 7 days   Get CBC, CMP checked  by Primary MD in 5-7 days    Activity: As tolerated with Full fall precautions use walker/cane & assistance as needed  Disposition SNF  Diet: Heart Healthy  with feeding assistance and aspiration precautions.   Special Instructions: If you have smoked or chewed Tobacco  in the last 2 yrs please stop smoking, stop  any regular Alcohol  and or any Recreational drug use.  On your next visit with your primary care physician please Get Medicines reviewed and adjusted.  Please request your Prim.MD to go over all Hospital Tests and Procedure/Radiological results at the follow up, please get all Hospital records sent to your Prim MD by signing hospital release before you go home.  If you experience worsening of your admission symptoms, develop shortness of breath, life threatening emergency, suicidal or homicidal thoughts you must seek medical attention immediately by calling 911 or calling your MD immediately  if symptoms less severe.  You Must read complete instructions/literature along with all the possible adverse reactions/side effects for all the Medicines you take and that have been prescribed to you. Take any new Medicines after you have completely understood and accpet all the possible adverse reactions/side effects.   Home infusion instructions [facility] May follow [place] Dosing Protocol; May administer Cathflo as needed to maintain patency of vascular access device.; Flushing of vascular access device: per [facility] Protocol: 0.9% NaCl pre/post medica...   Complete by:  As directed    Instructions:  May follow [place] Dosing Protocol   Instructions:  May administer Cathflo as needed to maintain patency of vascular access device.   Instructions:  Flushing of vascular access device: per [facility] Protocol: 0.9% NaCl pre/post medication administration and prn patency; Heparin 100 u/ml, 75m for implanted ports and Heparin 10u/ml, 535mfor all other central venous catheters.   Instructions:  May follow [facility] Anaphylaxis Protocol for First Dose Administration in the home: 0.9% NaCl at 25-50 ml/hr to maintain IV access for protocol meds. Epinephrine 0.3 ml IV/IM PRN and Benadryl 25-50 IV/IM PRN s/s of anaphylaxis.   Instructions:  [facility] Coordinator (RN) to assist per patient IV care needs  in the home PRN.   Increase activity slowly   Complete by:  As directed       Discharge Medications   Allergies as of 04/07/2018   No Known Allergies     Medication List    TAKE these medications   acetaminophen 325 MG tablet Commonly known as:  TYLENOL Take 2 tablets (650 mg total) by mouth every 6 (six) hours as needed for mild pain (or Fever >/= 101).   amLODIPine-Valsartan-HCTZ 10-320-25 MG Tabs Take 1 tablet by mouth daily.   ceFAZolin  IVPB Commonly known as:  ANCEF Inject 2 g into the vein every 8 (eight) hours. Indication:  Endocarditis  Last Day of Therapy:  05/13/18 Labs - Once weekly:  CBC/D and BMP Labs - Every other week:  ESR and CRP   clopidogrel 75 MG tablet Commonly known as:  PLAVIX Take 1 tablet (75 mg total) by mouth daily. What changed:  when to take this   cyanocobalamin 1000 MCG tablet Take 1 tablet (1,000 mcg total) by mouth daily.   docusate sodium 100 MG capsule Commonly known as:  COLACE Take 1 capsule (100 mg total) by mouth 2 (two) times daily.   ferrous sulfate 325 (65 FE) MG tablet Take 1 tablet (325 mg total) by mouth 2 (two) times daily with a meal.   HYDROcodone-acetaminophen 5-325 MG tablet Commonly known as:  NORCO/VICODIN Take 1 tablet by mouth every 8 (eight) hours as needed for moderate pain or severe pain.   methocarbamol 500 MG tablet Commonly known as:  ROBAXIN Take 500 mg by mouth daily as needed for muscle spasms.   metoprolol succinate 25 MG 24 hr tablet Commonly known as:  TOPROL-XL Take 25 mg by mouth daily.   NUTRITIONAL SUPPLEMENT Liqd Take 120 mLs by mouth 2 (two) times daily. MedPass   pantoprazole 40 MG tablet Commonly known as:  PROTONIX Take 1 tablet (40 mg total) by mouth daily.   polyethylene glycol packet Commonly known as:  MIRALAX / GLYCOLAX Take 17 g by mouth daily.            Home Infusion Instuctions  (From admission, onward)         Start     Ordered   04/07/18 0000  Home  infusion instructions [facility] May follow [place] Dosing Protocol; May administer Cathflo as needed to maintain patency of vascular access device.; Flushing of vascular access device: per [facility] Protocol: 0.9% NaCl pre/post medica...    Question Answer Comment  Instructions May follow [place] Dosing Protocol   Instructions May administer Cathflo as needed to maintain patency of vascular access device.   Instructions Flushing of vascular access device: per [facility] Protocol: 0.9% NaCl pre/post medication administration and prn patency; Heparin 100 u/ml, 76m for implanted ports and Heparin 10u/ml, 5101mfor all other central venous catheters.   Instructions May follow [facility] Anaphylaxis Protocol for First Dose Administration in the home: 0.9% NaCl at 25-50 ml/hr to maintain IV access for protocol meds. Epinephrine 0.3 ml IV/IM PRN and Benadryl 25-50 IV/IM PRN s/s of anaphylaxis.   Instructions [facility] Infusion Coordinator (RN) to assist per patient IV care needs in the home PRN.      04/07/18 091962         Contact information for follow-up providers    BrSerafina MitchellMD Follow up in 2 week(s).   Specialties:  Vascular Surgery, Cardiology Why:  Office will call you to arrange your appt (sent) Contact information: [id number] an appointment as soon as possible for a visit in 1 week(s).   Specialty:  Internal Medicine Contact information: [address] [address] [phone]          Contact information for after-discharge care    [place] .   Service:  Skilled Nursing Contact information: [address]. [phone]  Major procedures and Radiology Reports - PLEASE review detailed and final reports thoroughly  -         Dg Chest Port 1  View  Result Date: 03/30/2018 CLINICAL DATA:  Hypotension. Possible sepsis. EXAM: PORTABLE CHEST 1 VIEW COMPARISON:  02/17/2018. FINDINGS: Normal sized heart. Mildly tortuous and calcified thoracic aorta. Clear lungs with normal vascularity. Bilateral AC joint degenerative changes. IMPRESSION: No acute abnormality. Electronically Signed   By: Claudie Revering M.D.   On: 03/30/2018 11:57   [place] Ekg Site Rite  Result Date: 04/03/2018 If Site Rite image not attached, placement could not be confirmed due to current cardiac rhythm.   Micro Results    Recent Results (from the past 240 hour(s))  Blood Culture (routine x 2)     Status: Abnormal   Collection Time: 03/30/18 12:06 PM  Result Value Ref Range Status   Specimen Description BLOOD RIGHT ANTECUBITAL  Final   Special Requests   Final    BOTTLES DRAWN AEROBIC AND ANAEROBIC Blood Culture adequate volume   Culture  Setup Time   Final    GRAM POSITIVE COCCI IN BOTH AEROBIC AND ANAEROBIC BOTTLES CRITICAL VALUE NOTED.  VALUE IS CONSISTENT WITH PREVIOUSLY REPORTED AND CALLED VALUE.    Culture (A)  Final    STAPHYLOCOCCUS AUREUS SUSCEPTIBILITIES PERFORMED ON PREVIOUS CULTURE WITHIN THE LAST 5 DAYS. Performed at [facility], [place] [address], [place], [place] [address]    Report Status 04/02/2018 FINAL  Final  Blood Culture (routine x 2)     Status: Abnormal   Collection Time: 03/30/18 12:15 PM  Result Value Ref Range Status   Specimen Description BLOOD BLOOD RIGHT ARM  Final   Special Requests   Final    BOTTLES DRAWN AEROBIC AND ANAEROBIC Blood Culture adequate volume   Culture  Setup Time   Final    GRAM POSITIVE COCCI IN BOTH AEROBIC AND ANAEROBIC BOTTLES CRITICAL RESULT CALLED TO, READ BACK BY AND VERIFIED WITHKarsten Ro [facility] 4650 03/31/18 A BROWNING Performed at [facility], [place] [address] [place], [place] [address]    Culture STAPHYLOCOCCUS AUREUS (A)  Final   Report Status 04/02/2018 FINAL  Final   Organism ID,  Bacteria STAPHYLOCOCCUS AUREUS  Final      Susceptibility   Staphylococcus aureus - MIC*    CIPROFLOXACIN <=0.5 SENSITIVE Sensitive     ERYTHROMYCIN <=0.25 SENSITIVE Sensitive     GENTAMICIN <=0.5 SENSITIVE Sensitive     OXACILLIN 0.5 SENSITIVE Sensitive     TETRACYCLINE <=1 SENSITIVE Sensitive     VANCOMYCIN <=0.5 SENSITIVE Sensitive     TRIMETH/SULFA <=10 SENSITIVE Sensitive     CLINDAMYCIN <=0.25 SENSITIVE Sensitive     RIFAMPIN <=0.5 SENSITIVE Sensitive     Inducible Clindamycin NEGATIVE Sensitive     * STAPHYLOCOCCUS AUREUS  Blood Culture ID Panel (Reflexed)     Status: Abnormal   Collection Time: 03/30/18 12:15 PM  Result Value Ref Range Status   Enterococcus species NOT DETECTED NOT DETECTED Final   Listeria monocytogenes NOT DETECTED NOT DETECTED Final   Staphylococcus species DETECTED (A) NOT DETECTED Final    Comment: CRITICAL RESULT CALLED TO, READ BACK BY AND VERIFIED WITHLenna Sciara [facility] PHARMD 6812 03/31/18 A BROWNING    Staphylococcus aureus (BCID) DETECTED (A) NOT DETECTED Final    Comment: Methicillin (oxacillin) susceptible Staphylococcus aureus (MSSA). Preferred therapy is anti staphylococcal beta lactam antibiotic (Cefazolin or Nafcillin), unless clinically contraindicated. CRITICAL RESULT CALLED TO, READ BACK  BY AND VERIFIED WITHKarsten Ro [facility] 2202 03/31/18 A BROWNING    Methicillin resistance NOT DETECTED NOT DETECTED Final   Streptococcus species NOT DETECTED NOT DETECTED Final   Streptococcus agalactiae NOT DETECTED NOT DETECTED Final   Streptococcus pneumoniae NOT DETECTED NOT DETECTED Final   Streptococcus pyogenes NOT DETECTED NOT DETECTED Final   Acinetobacter baumannii NOT DETECTED NOT DETECTED Final   Enterobacteriaceae species NOT DETECTED NOT DETECTED Final   Enterobacter cloacae complex NOT DETECTED NOT DETECTED Final   Escherichia coli NOT DETECTED NOT DETECTED Final   Klebsiella oxytoca NOT DETECTED NOT DETECTED Final   Klebsiella pneumoniae NOT  DETECTED NOT DETECTED Final   Proteus species NOT DETECTED NOT DETECTED Final   Serratia marcescens NOT DETECTED NOT DETECTED Final   Haemophilus influenzae NOT DETECTED NOT DETECTED Final   Neisseria meningitidis NOT DETECTED NOT DETECTED Final   Pseudomonas aeruginosa NOT DETECTED NOT DETECTED Final   Candida albicans NOT DETECTED NOT DETECTED Final   Candida glabrata NOT DETECTED NOT DETECTED Final   Candida krusei NOT DETECTED NOT DETECTED Final   Candida parapsilosis NOT DETECTED NOT DETECTED Final   Candida tropicalis NOT DETECTED NOT DETECTED Final    Comment: Performed at [facility], [place] [address], [place], [place] [address]  Culture, blood (routine x 2)     Status: None   Collection Time: 04/01/18  7:09 AM  Result Value Ref Range Status   Specimen Description BLOOD RIGHT ARM  Final   Special Requests   Final    BOTTLES DRAWN AEROBIC AND ANAEROBIC Blood Culture adequate volume   Culture   Final    NO GROWTH 5 DAYS Performed at [facility], [address] [place], [place] [address]    Report Status 04/06/2018 FINAL  Final  Culture, blood (routine x 2)     Status: None   Collection Time: 04/01/18  7:09 AM  Result Value Ref Range Status   Specimen Description BLOOD RIGHT ARM  Final   Special Requests   Final    BOTTLES DRAWN AEROBIC AND ANAEROBIC Blood Culture adequate volume   Culture   Final    NO GROWTH 5 DAYS Performed at [facility], [place] [address] [place], [place] [address]    Report Status 04/06/2018 FINAL  Final  Surgical PCR screen     Status: Abnormal   Collection Time: 04/03/18 10:19 PM  Result Value Ref Range Status   MRSA, PCR POSITIVE (A) NEGATIVE Final    Comment: RESULT CALLED TO, READ BACK BY AND VERIFIED WITH: Jeannene Patella RN 1761 04/04/18 A BROWNING    Staphylococcus aureus POSITIVE (A) NEGATIVE Final    Comment: (NOTE) The Xpert SA Assay (FDA approved for NASAL specimens in patients 85 years of age and older), is one component  of a comprehensive surveillance program. It is not intended to diagnose infection nor to guide or monitor treatment. Performed at [facility], [place] [address] [place], [place] [address]     Today   Subjective    Maitland Lesiak today has no headache,no chest abdominal pain,no new weakness tingling or numbness, feels much better wants to go home today.    Objective   Blood pressure 124/80, pulse (!) 107, temperature 97.7 F (36.5 C), resp. rate 18, height _0  (1.778 m), weight 49.8 kg, SpO2 96 %.   Intake/Output Summary (Last 24 hours) at 04/07/2018 0909 Last data filed at 04/07/2018 0237 Gross per 24 hour  Intake 460 ml  Output -  Net 460 ml    Exam  Awake Alert, Oriented X 3, No new F.N deficits, Normal affect [place].AT,PERRAL Supple Neck,No JVD, No cervical lymphadenopathy appriciated.  Symmetrical Chest wall movement, Good air movement bilaterally, CTAB RRR,No Gallops, Rubs or new Murmurs, No Parasternal Heave +ve B.Sounds, Abd Soft, No tenderness, No organomegaly appriciated, No rebound - guarding or rigidity. No Cyanosis,  Bilat.BKA, R Arm PICC   Data Review   CBC w Diff:  Lab Results  Component Value Date   WBC 12.0 (H) 04/06/2018   HGB 9.1 (L) 04/06/2018   HCT 29.0 (L) 04/06/2018   PLT 519 (H) 04/06/2018   LYMPHOPCT 4 03/30/2018   MONOPCT 4 03/30/2018   EOSPCT 0 03/30/2018   BASOPCT 0 03/30/2018    CMP:  Lab Results  Component Value Date   NA 134 (L) 04/06/2018   K 3.7 04/06/2018   CL 106 04/06/2018   CO2 21 (L) 04/06/2018   BUN 11 04/06/2018   CREATININE 0.72 04/06/2018   PROT 5.4 (L) 04/01/2018   ALBUMIN 1.5 (L) 04/01/2018   BILITOT 1.3 (H) 04/01/2018   ALKPHOS 176 (H) 04/01/2018   AST 110 (H) 04/01/2018   ALT 89 (H) 04/01/2018  .   Total Time in preparing paper work, data evaluation and todays exam - 69 minutes  Lala Lund M.D on 04/07/2018 at 9:09 AM  [facility]   Office  [phone]

## 2018-04-07 NOTE — Clinical Social Work Placement (Deleted)
Nurse to call report to [phone]. Pt going going to Room 111  CLINICAL SOCIAL WORK PLACEMENT  NOTE  Date:  04/07/2018  Patient Details  Name: Malik Stanton MRN: [id number] Date of Birth: [birth year]  Clinical Social Work is seeking post-discharge placement for this patient at the   level of care (*CSW will initial, date and re-position this form in  chart as items are completed):      Patient/family provided with [facility] Clinical Social Work Department's list of facilities offering this level of care within the geographic area requested by the patient (or if unable, by the patient's family).      Patient/family informed of their freedom to choose among providers that offer the needed level of care, that participate in Medicare, Medicaid or managed care program needed by the patient, have an available bed and are willing to accept the patient.      Patient/family informed of [facility] ownership interest in [facility] and [facility], as well as of the fact that they are under no obligation to receive care at these facilities.  PASRR submitted to EDS on       PASRR number received on       Existing PASRR number confirmed on       FL2 transmitted to all facilities in geographic area requested by pt/family on       FL2 transmitted to all facilities within larger geographic area on       Patient informed that his/her managed care company has contracts with or will negotiate with certain facilities, including the following:            Patient/family informed of bed offers received.  Patient chooses bed at       Physician recommends and patient chooses bed at      Patient to be transferred to   on  .  Patient to be transferred to facility by       Patient family notified on   of transfer.  Name of family member notified:        PHYSICIAN       Additional Comment:    _______________________________________________ Arvin Collardara W Chaunda Vandergriff, LCSWA 04/07/2018, 10:01  AM

## 2018-04-07 NOTE — Clinical Social Work Placement (Deleted)
   CLINICAL SOCIAL WORK PLACEMENT  NOTE  Date:  04/07/2018  Patient Details  Name: Malik Stanton MRN: [id number] Date of Birth: Jun 25, 1950  Clinical Social Work is seeking post-discharge placement for this patient at the   level of care (*CSW will initial, date and re-position this form in  chart as items are completed):      Patient/family provided with [facility] Clinical Social Work Department's list of facilities offering this level of care within the geographic area requested by the patient (or if unable, by the patient's family).      Patient/family informed of their freedom to choose among providers that offer the needed level of care, that participate in Medicare, Medicaid or managed care program needed by the patient, have an available bed and are willing to accept the patient.      Patient/family informed of [facility]'s ownership interest in [facility] and [facility], as well as of the fact that they are under no obligation to receive care at these facilities.  PASRR submitted to EDS on       PASRR number received on       Existing PASRR number confirmed on       FL2 transmitted to all facilities in geographic area requested by pt/family on       FL2 transmitted to all facilities within larger geographic area on       Patient informed that his/her managed care company has contracts with or will negotiate with certain facilities, including the following:            Patient/family informed of bed offers received.  Patient chooses bed at       Physician recommends and patient chooses bed at      Patient to be transferred to   on  .  Patient to be transferred to facility by       Patient family notified on   of transfer.  Name of family member notified:        PHYSICIAN       Additional Comment:    _______________________________________________ Gwenlyn Fudgeaitlin B Lealon Vanputten, LCSWA 04/07/2018, 9:57 AM

## 2018-04-07 NOTE — Progress Notes (Signed)
Patient will Discharge To: [facility] Anticipated DC Date:04/07/18 Family Notified: yes, Malvin Johnsorcha Kwasnik, [phone] Transport ZO:XWRUBy:PTAR   Per MD [facility] . RN, patient, patient's family, and facility notified of DC. Assessment, Fl2/Pasrr, and Discharge Summary sent to facility. RN given number for report [phone], RM# [phone]). DC packet on chart. Ambulance transport requested for patient.   CSW signing off.  Budd Palmerara Barry Faircloth LCSWA [phone]

## 2018-04-07 NOTE — Clinical Social Work Placement (Signed)
   Nurse to call report to [phone]. Pt going to Room 111  CLINICAL SOCIAL WORK PLACEMENT  NOTE  Date:  04/07/2018  Patient Details  Name: Malik Stanton MRN: [id number] Date of Birth: 10-05-1950  Clinical Social Work is seeking post-discharge placement for this patient at the Skilled  Nursing Facility level of care (*CSW will initial, date and re-position this form in  chart as items are completed):  Yes   Patient/family provided with [place] Clinical Social Work Department's list of facilities offering this level of care within the geographic area requested by the patient (or if unable, by the patient's family).  Yes   Patient/family informed of their freedom to choose among providers that offer the needed level of care, that participate in Medicare, Medicaid or managed care program needed by the patient, have an available bed and are willing to accept the patient.  Yes   Patient/family informed of [place]'s ownership interest in [facility] and [facility], as well as of the fact that they are under no obligation to receive care at these facilities.  PASRR submitted to EDS on       PASRR number received on       Existing PASRR number confirmed on 04/02/18     FL2 transmitted to all facilities in geographic area requested by pt/family on 04/02/18     FL2 transmitted to all facilities within larger geographic area on       Patient informed that his/her managed care company has contracts with or will negotiate with certain facilities, including the following:        Yes   Patient/family informed of bed offers received.  [facility]     Physician recommends and patient chooses bed at      [facility] on 04/07/18.  Patient to be transferred to facility by PTAR     Patient family notified on 04/07/18 of transfer.  Name of family member notified:  Malvin JohnsPorcha Dimaggio     PHYSICIAN      Additional Comment:    _______________________________________________ Arvin Collardara W Jhaden Pizzuto, LCSWA 04/07/2018, 10:10 AM

## 2018-04-07 NOTE — Progress Notes (Signed)
Called RN at [facility] to give report, but no answer.

## 2018-04-07 NOTE — Progress Notes (Signed)
Pt given discharge instructions, prescriptions, and care notes. Pt verbalized understanding AEB no further questions or concerns at this time. PICC line locked by IV team, no redness, pain, or swelling noted at this time. Telemetry discontinued and Centralized Telemetry was notified. Pt left the floor via PTAR stable condition. Skin dry, flaky, and intact. Sacral dressing in place. Right BKA dressing clean, dry, & in place.

## 2018-04-09 ENCOUNTER — Telehealth: Payer: Self-pay | Admitting: Surgery

## 2018-04-09 NOTE — Telephone Encounter (Signed)
sch appt lvm mld ltr 05/14/2018 830am p/o MD

## 2018-04-09 NOTE — Telephone Encounter (Signed)
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  F/u with Dr. Myra GianottiBrabham in 4 weeks.  Thanks

## 2018-04-10 ENCOUNTER — Non-Acute Institutional Stay (SKILLED_NURSING_FACILITY): Payer: Medicare Other | Admitting: Internal Medicine

## 2018-04-10 ENCOUNTER — Encounter: Payer: Self-pay | Admitting: Internal Medicine

## 2018-04-10 DIAGNOSIS — Z515 Encounter for palliative care: Secondary | ICD-10-CM

## 2018-04-10 DIAGNOSIS — E43 Unspecified severe protein-calorie malnutrition: Secondary | ICD-10-CM

## 2018-04-10 DIAGNOSIS — I330 Acute and subacute infective endocarditis: Secondary | ICD-10-CM

## 2018-04-10 DIAGNOSIS — R29818 Other symptoms and signs involving the nervous system: Secondary | ICD-10-CM

## 2018-04-10 DIAGNOSIS — I96 Gangrene, not elsewhere classified: Secondary | ICD-10-CM | POA: Diagnosis not present

## 2018-04-10 DIAGNOSIS — R4189 Other symptoms and signs involving cognitive functions and awareness: Secondary | ICD-10-CM

## 2018-04-10 DIAGNOSIS — I70201 Unspecified atherosclerosis of native arteries of extremities, right leg: Secondary | ICD-10-CM

## 2018-04-10 NOTE — Assessment & Plan Note (Addendum)
Palliative care is most appropriate, this patient has no realistic chance of surviving CPR

## 2018-04-10 NOTE — Assessment & Plan Note (Addendum)
He now weighs 100 pounds; he has profound muscle mass wasting. Continue nutritional supplements

## 2018-04-10 NOTE — Assessment & Plan Note (Signed)
Lab results will be entered into Epic from [facility] and Dr. Ilsa IhaSnyder notified

## 2018-04-10 NOTE — Assessment & Plan Note (Signed)
He can provide no meaningful history Clinically he lacks competency to make medical decisions

## 2018-04-10 NOTE — Assessment & Plan Note (Signed)
Vascular surgery follow-up as per Dr[facility]

## 2018-04-10 NOTE — Progress Notes (Signed)
NURSING HOME LOCATION:  [facility] ROOM NUMBER:  111-A  CODE STATUS:  Full Code  [facility]  [facility]  [address] [address] [place] [id number]  This is a Nursing Facility readmission within 30 days.  Interim medical record and care since [facility] was updated with review of diagnostic studies and change in clinical status since last visit were documented.  HPI: Patient was rehospitalized 11/22-11/30/2019 with gangrene of the right foot with associated sepsis. Initially family opted for palliative care and nonsurgical support ,but they subsequently changed their mind.  Right BKA was completed 04/04/2018 by Dr Edwyna PerfectBrabham.Blood cultures collected 11/22 revealed MSSA, most likely secondary to right foot gangrene.  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 Dr. Ilsa IhaSnyder ID recommended IV Ancef 2 g every 8 hours for 6 weeks until 05/13/2018.  TEE was not completed as this was declined by the family. Chronic anemia was present in the context of dark stools possibly due to GI bleed. This was not documented even though he was on IV heparin for several days for his PAD.  GI was consulted but no intervention was recommended.  Oral Protonix was continued.   The anemia was slightly worse postoperatively necessitating transfusion of 1 unit of packed cells 11/28.   Nutritional supplements were continued for protein/caloric malnutrition. Postop he was converted to aspirin and Plavix. Acute renal failure due to hypotension resolved.  Review of systems: Dementia invalidated responses.  He denies any active symptoms including chest pain, op site pain, or bleeding dyscrasias.  He did not know why he had a BKA performed ; saying only "they had to cut my leg off".   Cardiovascular: No chest pain, palpitations, paroxysmal nocturnal dyspnea, claudication, edema  Respiratory: No cough, sputum production, hemoptysis, DOE, significant  snoring, apnea   Gastrointestinal: No heartburn, dysphagia, abdominal pain, nausea /vomiting, rectal bleeding, melena, change in bowels Genitourinary: No dysuria, hematuria, pyuria, incontinence, nocturia Musculoskeletal: No joint stiffness, joint swelling, weakness, pain Hematologic/lymphatic: No significant bruising, lymphadenopathy, abnormal bleeding Allergy/immunology: No itchy/watery eyes, significant sneezing, urticaria, angioedema  Physical exam:  Pertinent or positive findings: He is grossly cachectic with wasting of the temples, sunken eyes, and atrophic upper extremities.  He has pattern alopecia.  He has a beard and mustache.  He is lethargic and follows commands poorly.  Maxilla is edentulous, he has a few lower teeth in poor repair. Heart sounds are distant with some tachycardia.  He has low-grade scattered rhonchi.  Aorta is palpable with a bounding pulse.  Clinically there is concern for aortic aneurysm.  Bilateral BKA are present.  General appearance: no acute distress, increased work of breathing is present.   Lymphatic: No lymphadenopathy about the head, neck, axilla. Eyes: No conjunctival inflammation or lid edema is present. There is no scleral icterus. Ears:  External ear exam shows no significant lesions or deformities.   Nose:  External nasal examination shows no deformity or inflammation. Nasal mucosa are pink and moist without lesions, exudates Oral exam:  Lips and gums are healthy appearing. There is no oropharyngeal erythema or exudate. Neck:  No thyromegaly, masses, tenderness noted.    Heart:  No gallop, murmur, click, rub .  Lungs:  Without wheezes, rales, rubs. Abdomen: Bowel sounds are normal. Abdomen is soft and nontender with no organomegaly, hernias, masses. GU: Deferred  Extremities:  No cyanosis, clubbing, edema  Skin: Warm & dry w/o tenting. No significant lesions or rash.  See summary under each active problem in the Problem List with associated updated  therapeutic plan

## 2018-04-10 NOTE — Patient Instructions (Signed)
See assessment and plan under each diagnosis in the problem list and acutely for this visit 

## 2018-04-13 LAB — HEPATIC FUNCTION PANEL
ALT: 7 — AB (ref 10–40)
AST: 32 (ref 14–40)
Alkaline Phosphatase: 123 (ref 25–125)
Bilirubin, Total: 0.3

## 2018-04-13 LAB — CBC AND DIFFERENTIAL
HCT: 29 — AB (ref 41–53)
Hemoglobin: 9.8 — AB (ref 13.5–17.5)
Neutrophils Absolute: 7
Platelets: 578 — AB (ref 150–399)
WBC: 9.7

## 2018-04-13 LAB — BASIC METABOLIC PANEL
BUN: 31 — AB (ref 4–21)
CREATININE: 0.8 (ref <=1.3)
Glucose: 103
Potassium: 4.1 (ref 3.4–5.3)
Sodium: 139 (ref 137–147)

## 2018-04-18 ENCOUNTER — Emergency Department (HOSPITAL_COMMUNITY)
Admission: EM | Admit: 2018-04-18 | Discharge: 2018-04-19 | Disposition: A | Discharge status: Home / Self Care | Payer: Medicare Other | Attending: Emergency Medicine | Admitting: Emergency Medicine

## 2018-04-18 ENCOUNTER — Encounter (HOSPITAL_COMMUNITY): Payer: Self-pay

## 2018-04-18 DIAGNOSIS — Z7901 Long term (current) use of anticoagulants: Secondary | ICD-10-CM | POA: Diagnosis not present

## 2018-04-18 DIAGNOSIS — T82524A Displacement of infusion catheter, initial encounter: Secondary | ICD-10-CM | POA: Insufficient documentation

## 2018-04-18 DIAGNOSIS — Z87891 Personal history of nicotine dependence: Secondary | ICD-10-CM | POA: Insufficient documentation

## 2018-04-18 DIAGNOSIS — I10 Essential (primary) hypertension: Secondary | ICD-10-CM | POA: Insufficient documentation

## 2018-04-18 DIAGNOSIS — Y69 Unspecified misadventure during surgical and medical care: Secondary | ICD-10-CM | POA: Diagnosis not present

## 2018-04-18 DIAGNOSIS — Z79899 Other long term (current) drug therapy: Secondary | ICD-10-CM | POA: Diagnosis not present

## 2018-04-18 DIAGNOSIS — T82528A Displacement of other cardiac and vascular devices and implants, initial encounter: Secondary | ICD-10-CM

## 2018-04-18 MED ORDER — CEFAZOLIN SODIUM-DEXTROSE 2-4 GM/100ML-% IV SOLN
2.0000 g | Freq: Once | INTRAVENOUS | Status: AC
  Administered 2018-04-19: 2 g via INTRAVENOUS
  Filled 2018-04-18: qty 100

## 2018-04-18 MED ORDER — HEPARIN SOD (PORK) LOCK FLUSH 100 UNIT/ML IV SOLN
500.0000 [IU] | Freq: Once | INTRAVENOUS | Status: AC
  Administered 2018-04-19: 500 [IU]
  Filled 2018-04-18: qty 5

## 2018-04-18 NOTE — ED Notes (Signed)
Bed: [id number] Expected date:  Expected time:  Means of arrival:  Comments: 67 yr old PICC

## 2018-04-18 NOTE — ED Provider Notes (Signed)
[facility] DEPT Provider Note: Georgena Spurling, MD, FACEP  CSN: [id number] MRN: [id number] ARRIVAL: 04/18/18 at 2303 ROOM: [place]  PICC line dislodged  Level 5 caveat: Dementia HISTORY OF PRESENT ILLNESS  04/18/18 11:15 PM Malik Stanton is a 67 y.o. male who was recently diagnosed with acute bacterial endocarditis. He is on cefazolin 2 g per PICC line every 8 hours.  He was sent from his nursing facility because he pulled his PICC line out several inches.  They would like it replaced.  He had a recent right BKA and a remote left BKA.  He denies pain.   Past Medical History:  Diagnosis Date  . Acute GI bleeding 04/02/2018  . Blood clot in vein 02/2018  . Dementia ([place])   . Hypertension   . PVD (peripheral vascular disease) ([place])     Past Surgical History:  Procedure Laterality Date  . ABDOMINAL AORTOGRAM W/LOWER EXTREMITY N/A 02/20/2018   Procedure: ABDOMINAL AORTOGRAM W/LOWER EXTREMITY;  Surgeon: Waynetta Sandy, MD;  Location: [facility];  Service: Cardiovascular;  Laterality: N/A;  bilateral  . AMPUTATION Left 03/02/2018   Procedure: AMPUTATION BELOW KNEE;  Surgeon: Marty Heck, MD;  Location: [facility];  Service: Vascular;  Laterality: Left;  . AMPUTATION Right 04/04/2018   Procedure: AMPUTATION BELOW KNEE;  Surgeon: Serafina Mitchell, MD;  Location: [facility];  Service: Vascular;  Laterality: Right;  . OTHER SURGICAL HISTORY     "I was small when I had a surgery on my legs, arms and across my stomach" (02/20/2018)  . PERIPHERAL VASCULAR INTERVENTION Left 02/20/2018   lt. SFA, lt iliac stents  . PERIPHERAL VASCULAR INTERVENTION  02/20/2018   Procedure: PERIPHERAL VASCULAR INTERVENTION;  Surgeon: Waynetta Sandy, MD;  Location: [facility];  Service: Cardiovascular;;  lt. SFA, lt iliac stents    History reviewed. No pertinent family history.  Social History   Tobacco Use  . Smoking status: Former Smoker   Packs/day: 0.50    Years: 50.00    Pack years: 25.00    Types: Cigarettes  . Smokeless tobacco: Never Used  Substance Use Topics  . Alcohol use: Never    Frequency: Never  . Drug use: Not Currently    Prior to Admission medications   Medication Sig Start Date End Date Taking? Authorizing Provider  acetaminophen (TYLENOL) 325 MG tablet Take 2 tablets (650 mg total) by mouth every 6 (six) hours as needed for mild pain (or Fever >/= 101). 03/07/18  Yes Hongalgi, Lenis Dickinson, MD  amLODIPine-Valsartan-HCTZ [phone] MG TABS Take 1 tablet by mouth daily.    Yes [provider]  ceFAZolin (ANCEF) IVPB Inject 2 g into the vein every 8 (eight) hours. Indication:  Endocarditis Last Day of Therapy:  05/13/18 Labs - Once weekly:  CBC/D and BMP Labs - Every other week:  ESR and CRP 04/07/18  Yes Thurnell Lose, MD  clopidogrel (PLAVIX) 75 MG tablet Take 1 tablet (75 mg total) by mouth daily. 02/23/18  Yes Patrecia Pour, MD  docusate sodium (COLACE) 100 MG capsule Take 1 capsule (100 mg total) by mouth 2 (two) times daily. 04/07/18  Yes Thurnell Lose, MD  ferrous sulfate 325 (65 FE) MG tablet Take 1 tablet (325 mg total) by mouth 2 (two) times daily with a meal. 03/07/18  Yes Hongalgi, Lenis Dickinson, MD  HYDROcodone-acetaminophen (NORCO/VICODIN) 5-325 MG tablet Take 1 tablet by mouth every 8 (eight) hours as needed for  moderate pain or severe pain. 04/07/18  Yes Thurnell Lose, MD  methocarbamol (ROBAXIN) 500 MG tablet Take 500 mg by mouth daily as needed for muscle spasms.    Yes [provider]  metoprolol succinate (TOPROL-XL) 25 MG 24 hr tablet Take 25 mg by mouth daily.  12/06/17  Yes [provider]  NUTRITIONAL SUPPLEMENT LIQD Take 120 mLs by mouth 2 (two) times daily. MedPass   Yes [provider]  pantoprazole (PROTONIX) 40 MG tablet Take 1 tablet (40 mg total) by mouth daily. 04/07/18  Yes Thurnell Lose, MD  polyethylene glycol (MIRALAX / GLYCOLAX) packet  Take 17 g by mouth daily. 03/07/18  Yes Hongalgi, Lenis Dickinson, MD  vitamin B-12 1000 MCG tablet Take 1 tablet (1,000 mcg total) by mouth daily. 03/08/18  Yes Hongalgi, Lenis Dickinson, MD    Allergies Patient has no known allergies.   REVIEW OF SYSTEMS     PHYSICAL EXAMINATION  Initial Vital Signs Blood pressure (!) 93/59, pulse (!) 112, temperature (!) 97.4 F (36.3 C), temperature source Oral, resp. rate 15, SpO2 94 %.  Examination General: Well-developed, cachectic male in no acute distress; appearance consistent with age of record HENT: normocephalic; atraumatic Eyes: pupils equal, round and reactive to light; extraocular muscles grossly intact Neck: supple Heart: regular rate and rhythm Lungs: clear to auscultation bilaterally Abdomen: soft; nondistended; nontender; bowel sounds present Extremities: Arthritic changes; remote left BKA; recent right BKA with well-healing incision and no signs of infection; PICC line right antecubital fossa, withdrawn several inches Neurologic: Awake, alert, confused; motor function intact in all extremities and symmetric; no facial droop Skin: Warm and dry Psychiatric: Normal mood and affect   RESULTS  Summary of this visit's results, reviewed by myself:   EKG Interpretation  Date/Time:    Ventricular Rate:    PR Interval:    QRS Duration:   QT Interval:    QTC Calculation:   R Axis:     Text Interpretation:        Laboratory Studies: No results found for this or any previous visit (from the past 24 hour(s)). Imaging Studies: No results found.  ED COURSE and MDM  Nursing notes and initial vitals signs, including pulse oximetry, reviewed.  Vitals:   04/18/18 2325  BP: (!) 93/59  Pulse: (!) 112  Resp: 15  Temp: (!) 97.4 F (36.3 C)  TempSrc: Oral  SpO2: 94%   PICC line was determined to be functional by nursing staff.  He was given a 2 g dose of Ancef.  The PICC line was redressed.  We will send him back to his nursing home and  advised that PICC line replacement be arranged during the daytime as an outpatient.  PROCEDURES    ED DIAGNOSES     ICD-10-CM   1. Displacement of peripherally inserted central catheter (PICC) ([place]) T82.528A        Shanon Rosser, MD 04/19/18 [phone]

## 2018-04-18 NOTE — ED Triage Notes (Signed)
Pt sent here from facility to have PICC Line replaced

## 2018-04-19 DIAGNOSIS — T82524A Displacement of infusion catheter, initial encounter: Secondary | ICD-10-CM | POA: Diagnosis not present

## 2018-04-19 NOTE — Discharge Instructions (Signed)
The patient's PICC line is operating properly.  It is been pulled out partially but has been cleaned and redressed.  Please have the patient's PICC line replaced as an outpatient during daytime hours.  We do not change PICC lines after hours.

## 2018-04-20 LAB — BASIC METABOLIC PANEL
BUN: 30 — AB (ref 4–21)
Creatinine: 0.8 (ref 0.6–1.3)
Glucose: 89
Potassium: 4.1 (ref 3.4–5.3)
Sodium: 136 — AB (ref 137–147)

## 2018-04-20 LAB — CBC AND DIFFERENTIAL
HCT: 29 — AB (ref 41–53)
Hemoglobin: 9.7 — AB (ref 13.5–17.5)
Neutrophils Absolute: 6
Platelets: 423 — AB (ref 150–399)
WBC: 8

## 2018-04-20 LAB — HEPATIC FUNCTION PANEL
ALT: 5 — AB (ref 10–40)
AST: 20 (ref 14–40)
Alkaline Phosphatase: 107 (ref 25–125)
Bilirubin, Total: 0.3

## 2018-04-21 LAB — HEPATIC FUNCTION PANEL
ALT: 5 — AB (ref 10–40)
AST: 13 — AB (ref 14–40)
Alkaline Phosphatase: 97 (ref 25–125)
BILIRUBIN, TOTAL: 0.2

## 2018-04-21 LAB — BASIC METABOLIC PANEL
BUN: 26 — AB (ref 4–21)
Creatinine: 0.7 (ref 0.6–1.3)
Glucose: 91
Potassium: 2.9 — AB (ref 3.4–5.3)
Sodium: 132 — AB (ref 137–147)

## 2018-04-21 LAB — CBC AND DIFFERENTIAL
HCT: 27 — AB (ref 41–53)
Hemoglobin: 9.1 — AB (ref 13.5–17.5)
NEUTROS ABS: 6
Platelets: 417 — AB (ref 150–399)
WBC: 8.5

## 2018-04-23 DIAGNOSIS — Z0000 Encounter for general adult medical examination without abnormal findings: Secondary | ICD-10-CM

## 2018-04-23 NOTE — Progress Notes (Signed)
This encounter was created in error - please disregard.

## 2018-05-04 LAB — BASIC METABOLIC PANEL
BUN: 17 (ref 4–21)
Creatinine: 0.7 (ref 0.6–1.3)
Glucose: 114
Potassium: 2.6 — AB (ref 3.4–5.3)
Sodium: 146 (ref 137–147)

## 2018-05-04 LAB — CBC AND DIFFERENTIAL
HCT: 25 — AB (ref 41–53)
HEMOGLOBIN: 8.4 — AB (ref 13.5–17.5)
Neutrophils Absolute: 6
Platelets: 548 — AB (ref 150–399)
WBC: 8.9

## 2018-05-04 LAB — HEPATIC FUNCTION PANEL
ALT: 5 — AB (ref 10–40)
AST: 14 (ref 14–40)
Alkaline Phosphatase: 146 — AB (ref 25–125)
Bilirubin, Total: 0.2

## 2018-05-08 ENCOUNTER — Encounter: Payer: Self-pay | Admitting: Internal Medicine

## 2018-05-08 ENCOUNTER — Non-Acute Institutional Stay (SKILLED_NURSING_FACILITY): Payer: Medicare Other | Admitting: Internal Medicine

## 2018-05-08 DIAGNOSIS — I9589 Other hypotension: Secondary | ICD-10-CM

## 2018-05-08 DIAGNOSIS — E876 Hypokalemia: Secondary | ICD-10-CM

## 2018-05-08 DIAGNOSIS — I330 Acute and subacute infective endocarditis: Secondary | ICD-10-CM | POA: Diagnosis not present

## 2018-05-08 DIAGNOSIS — E43 Unspecified severe protein-calorie malnutrition: Secondary | ICD-10-CM | POA: Diagnosis not present

## 2018-05-08 NOTE — Assessment & Plan Note (Signed)
Speech therapy reports that oral intake is minimal, mainly in the form of milk ingestion He is profoundly cachectic. Anorexia has not responded to the Remeron

## 2018-05-08 NOTE — Patient Instructions (Signed)
See assessment and plan under each diagnosis in the problem list and acutely for this visit 

## 2018-05-08 NOTE — Progress Notes (Signed)
NURSING HOME LOCATION:  [facility] ROOM NUMBER:  111-A  CODE STATUS:  Full Code  PCP:  [facility]  [address] [place] [address]  This is a nursing facility follow up for specific acute issue of adult failure to thrive.  Interim medical record and care since last [facility] visit was updated with review of diagnostic studies and change in clinical status since last visit were documented.  HPI: He continues to exhibit anorexia with minimal oral intake.  Speech therapy has been working with the patient in an attempt to improve nutrition.  He also had been placed on Remeron 15 mg at bedtime in attempt to improve his appetite.  Speech therapy reports that he is on a pure, liquid diet but intake is extremely poor.  He tends to drink only milk. They have noted significant residual but no clinical aspiration with swallowing. He continues to receive IV Ancef for endocarditis, therapy will continue until 05/13/2018.  Weekly CBC and differential and BMP and every other week sed rate and CRP were requested to monitor response. Most recent labs were dated 05/04/2018.  Glucose was 114, calcium 8.3, sodium 146, potassium 2.6 total protein 5.4, albumin 2.6, and alk phos 146.  CBC revealed a normal differential normochromic, normocytic anemia.  Hemoglobin was 8.4 hematocrit 25.4.  On 04/27/2018 ESR was 127 and see reactive protein 25.58 (less than 0.5).  As these were done by [facility] and entered into AHT; I do not know if the ID specialist has seen these. Care plan meeting with the family was to have been completed today, but they canceled this meeting.  My intent was to verify they understand the severity of his condition and his terminal state.  Review of systems: He denies any active symptoms.  All responses are monosyllabic "no".  He does state that he is not eating because "not hungry".  Constitutional: No fever, significant weight change, fatigue   Eyes: No redness, discharge, pain, vision change ENT/mouth: No nasal congestion,  purulent discharge, earache, change in hearing, sore throat  Cardiovascular: No chest pain, palpitations, paroxysmal nocturnal dyspnea, claudication, edema  Respiratory: No cough, sputum production, hemoptysis  Gastrointestinal: No heartburn, dysphagia, abdominal pain, nausea /vomiting, rectal bleeding, melena, change in bowels Genitourinary: No dysuria, hematuria, pyuria, incontinence, nocturia Musculoskeletal: No joint stiffness, joint swelling, weakness, pain Dermatologic: No rash, pruritus, change in appearance of skin Neurologic: No dizziness, headache, syncope, seizures, numbness, tingling Psychiatric: No significant anxiety, depression, insomnia Endocrine: No change in hair/skin/nails, excessive thirst, excessive hunger, excessive urination  Hematologic/lymphatic: No significant bruising, lymphadenopathy, abnormal bleeding  Physical exam:  Pertinent or positive findings: Gross cachexia is present.  This is manifested as temporal wasting, sunken eyeballs, limb atrophy, interosseous wasting and diffuse loss of subcutaneous tissue.  This results in winging of the scapulae and his ribs and sternum being easily visualized. Mouth is dry and coated.  He has only a few lower teeth which are in poor repair.  Breath sounds are decreased and respirations are shallow.  Heart sounds are distant; rhythm appears to be regular clinically.  Bowel sounds are active.  The abdomen is scaphoid.  He has bilateral BKA's. He can follow simple commands such as raising his arms.  He is profoundly weak.    General appearance:  no acute distress, increased work of breathing is present.   Lymphatic: No lymphadenopathy about the head, neck Eyes: No conjunctival inflammation or lid edema is present. There is  no scleral icterus. Ears:  External ear exam shows no significant lesions or deformities.   Nose:  External nasal examination  shows no deformity or inflammation.  Neck:  No thyromegaly, masses, tenderness noted.    Heart:  No gallop, murmur, click, rub .  Lungs:  without wheezes, rhonchi, rales, rubs. Abdomen:no organomegaly, hernias, masses. GU: Deferred  Extremities:  No cyanosis, clubbing, edema  Skin: Warm & dry  No significant lesions or rash.  See summary under each active problem in the Problem List with associated updated therapeutic plan

## 2018-05-08 NOTE — Assessment & Plan Note (Addendum)
Ancef to be completed 05/13/2017 I shall verify Dr Anne HahnSynder has received lab results

## 2018-05-11 LAB — HEPATIC FUNCTION PANEL
ALT: 5 — AB (ref 10–40)
AST: 12 — AB (ref 14–40)
Alkaline Phosphatase: 114 (ref 25–125)
BILIRUBIN, TOTAL: 0.2

## 2018-05-11 LAB — BASIC METABOLIC PANEL
BUN: 30 — AB (ref 4–21)
Creatinine: 0.8 (ref 0.6–1.3)
Glucose: 102
Potassium: 3.5 (ref 3.4–5.3)
Sodium: 152 — AB (ref 137–147)

## 2018-05-14 ENCOUNTER — Encounter: Payer: Self-pay | Admitting: Surgery

## 2018-05-14 ENCOUNTER — Other Ambulatory Visit: Payer: Self-pay

## 2018-05-14 ENCOUNTER — Ambulatory Visit (INDEPENDENT_AMBULATORY_CARE_PROVIDER_SITE_OTHER): Payer: Medicare Other | Admitting: Surgery

## 2018-05-14 VITALS — BP 150/105 | HR 125 | Temp 96.6°F | Resp 16

## 2018-05-14 DIAGNOSIS — I998 Other disorder of circulatory system: Secondary | ICD-10-CM

## 2018-05-14 DIAGNOSIS — I70229 Atherosclerosis of native arteries of extremities with rest pain, unspecified extremity: Secondary | ICD-10-CM

## 2018-05-14 NOTE — Progress Notes (Signed)
Patient name: Malik Stanton MRN: [id number] DOB: 1950/09/05 Sex: male  REASON FOR VISIT:     post op  HISTORY OF PRESENT ILLNESS:   Malik Stanton is a 68 y.o. male who is here today for his first postoperative visit.  He initially presented septic with a right foot wound.  He was obtunded.  Initial plan was to proceed with palliative care, however he improved with antibiotics and so we proceeded with amputation.  He has a history of a left leg amputation.  CURRENT MEDICATIONS:    Current Outpatient Medications  Medication Sig Dispense Refill  . acetaminophen (TYLENOL) 325 MG tablet Take 2 tablets (650 mg total) by mouth every 6 (six) hours as needed for mild pain (or Fever >/= 101).    [name] amLODIPine-Valsartan-HCTZ 10-320-25 MG TABS Take 1 tablet by mouth daily. Hold if BP is less than 100    . ceFAZolin (ANCEF) IVPB Inject 2 g into the vein every 8 (eight) hours. Indication:  Endocarditis Last Day of Therapy:  05/13/18 Labs - Once weekly:  CBC/D and BMP Labs - Every other week:  ESR and CRP 126 Units 0  . clopidogrel (PLAVIX) 75 MG tablet Take 1 tablet (75 mg total) by mouth daily. 30 tablet 0  . docusate sodium (COLACE) 100 MG capsule Take 1 capsule (100 mg total) by mouth 2 (two) times daily. 10 capsule 0  . ferrous sulfate 325 (65 FE) MG tablet Take 1 tablet (325 mg total) by mouth 2 (two) times daily with a meal.    . HYDROcodone-acetaminophen (NORCO/VICODIN) 5-325 MG tablet Take 1 tablet by mouth every 8 (eight) hours as needed for moderate pain or severe pain. 10 tablet 0  . methocarbamol (ROBAXIN) 500 MG tablet Take 500 mg by mouth daily as needed for muscle spasms.     . metoprolol succinate (TOPROL-XL) 25 MG 24 hr tablet Take 25 mg by mouth daily. Hold if BP is less than 100  1  . mirtazapine (REMERON) 15 MG tablet Take 15 mg by mouth at bedtime.    [name] NUTRITIONAL SUPPLEMENT LIQD Take 120 mLs by mouth 2 (two) times daily. MedPass    . pantoprazole  (PROTONIX) 40 MG tablet Take 1 tablet (40 mg total) by mouth daily.    . polyethylene glycol (MIRALAX / GLYCOLAX) packet Take 17 g by mouth daily.    . vitamin B-12 1000 MCG tablet Take 1 tablet (1,000 mcg total) by mouth daily.     No current facility-administered medications for this visit.     REVIEW OF SYSTEMS:   [X]  denotes positive finding, [ ]  denotes negative finding Cardiac  Comments:  Chest pain or chest pressure:    Shortness of breath upon exertion:    Short of breath when lying flat:    Irregular heart rhythm:    Constitutional    Fever or chills:      PHYSICAL EXAM:   There were no vitals filed for this visit.  GENERAL: The patient is a well-nourished male, in no acute distress. The vital signs are documented above. CARDIOVASCULAR: There is a regular rate and rhythm. PULMONARY: Non-labored respirations Stump is healing nicely.  Staples have been removed.  STUDIES:   None   MEDICAL ISSUES:   Below-knee amputation is healing nicely.  He will be fitted for stump shrinker.  He will follow-up on an as-needed basis.  All of his wounds were closed.  Annamarie Major, MD [facility] [phone]  Pager [phone]

## 2018-05-18 ENCOUNTER — Emergency Department (HOSPITAL_COMMUNITY): Payer: Medicare Other

## 2018-05-18 ENCOUNTER — Emergency Department (HOSPITAL_COMMUNITY)
Admission: EM | Admit: 2018-05-18 | Discharge: 2018-06-09 | Disposition: E | Payer: Medicare Other | Attending: Emergency Medicine | Admitting: Emergency Medicine

## 2018-05-18 DIAGNOSIS — I469 Cardiac arrest, cause unspecified: Secondary | ICD-10-CM | POA: Diagnosis present

## 2018-05-18 MED ORDER — NOREPINEPHRINE-SODIUM CHLORIDE 4-0.9 MG/250ML-% IV SOLN
0.0000 ug/min | INTRAVENOUS | Status: DC
  Filled 2018-05-18: qty 250

## 2018-05-18 MED ORDER — LACTATED RINGERS IV BOLUS: 1000.0000 mL | Freq: Once | INTRAVENOUS | Status: DC

## 2018-05-18 MED ORDER — VANCOMYCIN HCL IN DEXTROSE 1-5 GM/200ML-% IV SOLN: 1000.0000 mg | Freq: Once | INTRAVENOUS | Status: DC

## 2018-05-18 MED ORDER — SODIUM CHLORIDE 0.9 % IV SOLN: 2.0000 g | Freq: Once | INTRAVENOUS | Status: DC

## 2018-05-18 MED ORDER — METRONIDAZOLE IN NACL 5-0.79 MG/ML-% IV SOLN: 500.0000 mg | Freq: Three times a day (TID) | INTRAVENOUS | Status: DC

## 2018-05-20 MED FILL — Medication: Qty: 2 | Status: AC

## 2018-06-09 NOTE — ED Notes (Signed)
Sister's Phone Number   [phone]

## 2018-06-09 NOTE — ED Notes (Signed)
20:07 Pt arrived to room  20:07 Nurse: Vernie Shanks Arrived 20:08 EDP's: Benjiman Core) Antoine Primas), Nurses: Telford Nab, Tenna Delaine, ED Techs: Philbert Riser, Sheffield Slider and Respiratory Sarah arrived.  20:08 CPR Started 20:10 1 Amp Epi Given, CPR Continued  20:12 Pulse Check, No Pulses, CPR Continued 20:13 Second Amp Epi Given  20:14 2000 ml NS Fluid Bolus Started 20:16 Pulse Check, Pulses Palpated, CPR Held  20:20 Levophed Started @ 10 mcg/hr 20:22 Etomidate 20 mg Given 20:22 Rocuronium 50 mg Given  20:23 7.0 Cuffed Endotracheal Tube placed at 26 in at the lips  20:30 Pulse Check, No Pulses, CPR Started 20:32 1 Amp Epi Given 20:33 Levophed Increased to 20 mcg/hr 20:34 1 Amp Calcium Chloride Given 20:35 Pulse Check, Faint Pulses Palpated 20:36 Pt appeared in V-Fib 20:37 1 Amp BiCarb Given 20:38 Defibrillated  20:38 CPR Resumed 20:39 Levophed Increased to 50 mcg/hr 20:40 Pulse Check, No Pulses Palpated  20:40 Time of Death Called by Antoine Primas

## 2018-06-09 NOTE — ED Triage Notes (Signed)
BIB [facility] from [facility] battling sepsis for a month. Per ems pt had pulses on their arrival. Pt being bagged upon arrival to bridge. EDP notifed.

## 2018-06-09 NOTE — ED Provider Notes (Addendum)
[facility] EMERGENCY DEPARTMENT Provider Note   CSN: [id number] Arrival date & time: 05-20-2018  2013     History   Chief Complaint Chief Complaint  Patient presents with  . Failure To Thrive    HPI Kelly Eisler is a 68 y.o. male.  HPI   Patient is a 68 year old male with a past medical history of GI bleed, dementia, HTN, and reportedly receiving IV antibiotics who was brought in by EMS for evaluation of failure to thrive and concern for worsening sepsis.  Per EMS patient became pulseless on arrival to the emergency department and they initiated CPR.  They are unable to provide any additional history about the patient.  Patient is unable provide any history secondary to being pulseless and unresponsive.  Past Medical History:  Diagnosis Date  . Acute GI bleeding 04/02/2018  . Blood clot in vein 02/2018  . Dementia ([place])   . Hypertension   . PVD (peripheral vascular disease) [facility])     Patient Active Problem List   Diagnosis Date Noted  . 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 Advanced dementia ([place])   . Sinus tachycardia   . Postoperative pain   . Hyponatremia   . Leukocytosis   . Acute blood loss anemia   . Protein-calorie malnutrition, severe 04/03/2018  . Endocarditis 04/02/2018  . Cellulitis of right lower extremity   . Palliative care by specialist   . Goals of care, counseling/discussion   . Acute GI bleeding   . AKI (acute kidney injury) ([place])   . Neurocognitive deficits 03/30/2018  . Sepsis ([place]) 03/30/2018  . Essential hypertension 03/01/2018  . PVD (peripheral vascular disease) ([place]) 03/01/2018  . Gangrene of foot ([place]) 03/01/2018  . Normocytic anemia 03/01/2018  . Gangrene of left lower extremity due to atherosclerosis ([place])   . Critical lower limb ischemia 02/18/2018  . Hypokalemia 02/18/2018    Past Surgical History:  Procedure Laterality Date  . ABDOMINAL AORTOGRAM W/LOWER EXTREMITY N/A  02/20/2018   Procedure: ABDOMINAL AORTOGRAM W/LOWER EXTREMITY;  Surgeon: Waynetta Sandy, MD;  Location: [facility];  Service: Cardiovascular;  Laterality: N/A;  bilateral  . AMPUTATION Left 03/02/2018   Procedure: AMPUTATION BELOW KNEE;  Surgeon: Marty Heck, MD;  Location: [facility];  Service: Vascular;  Laterality: Left;  . AMPUTATION Right 04/04/2018   Procedure: AMPUTATION BELOW KNEE;  Surgeon: Serafina Mitchell, MD;  Location: [facility];  Service: Vascular;  Laterality: Right;  . OTHER SURGICAL HISTORY     "I was small when I had a surgery on my legs, arms and across my stomach" (02/20/2018)  . PERIPHERAL VASCULAR INTERVENTION Left 02/20/2018   lt. SFA, lt iliac stents  . PERIPHERAL VASCULAR INTERVENTION  02/20/2018   Procedure: PERIPHERAL VASCULAR INTERVENTION;  Surgeon: Waynetta Sandy, MD;  Location: [facility];  Service: Cardiovascular;;  lt. SFA, lt iliac stents        Home Medications    Prior to Admission medications   Medication Sig Start Date End Date Taking? Authorizing Provider  acetaminophen (TYLENOL) 325 MG tablet Take 2 tablets (650 mg total) by mouth every 6 (six) hours as needed for mild pain (or Fever >/= 101). 03/07/18   Hongalgi, Lenis Dickinson, MD  amLODIPine-Valsartan-HCTZ [id number] MG TABS Take 1 tablet by mouth daily. Hold if BP is less than 100    [provider]  ceFAZolin (ANCEF) IVPB Inject 2 g into the vein every 8 (eight)  hours. Indication:  Endocarditis Last Day of Therapy:  05/13/18 Labs - Once weekly:  CBC/D and BMP Labs - Every other week:  ESR and CRP 04/07/18   Thurnell Lose, MD  clopidogrel (PLAVIX) 75 MG tablet Take 1 tablet (75 mg total) by mouth daily. 02/23/18   Patrecia Pour, MD  docusate sodium (COLACE) 100 MG capsule Take 1 capsule (100 mg total) by mouth 2 (two) times daily. 04/07/18   Thurnell Lose, MD  ferrous sulfate 325 (65 FE) MG tablet Take 1 tablet (325 mg total) by mouth 2 (two) times  daily with a meal. 03/07/18   Hongalgi, Lenis Dickinson, MD  HYDROcodone-acetaminophen (NORCO/VICODIN) 5-325 MG tablet Take 1 tablet by mouth every 8 (eight) hours as needed for moderate pain or severe pain. 04/07/18   Thurnell Lose, MD  methocarbamol (ROBAXIN) 500 MG tablet Take 500 mg by mouth daily as needed for muscle spasms.     [provider]  metoprolol succinate (TOPROL-XL) 25 MG 24 hr tablet Take 25 mg by mouth daily. Hold if BP is less than 100 12/06/17   [provider]  mirtazapine (REMERON) 15 MG tablet Take 15 mg by mouth at bedtime.    [provider]  NUTRITIONAL SUPPLEMENT LIQD Take 120 mLs by mouth 2 (two) times daily. [employer]    [provider]  pantoprazole (PROTONIX) 40 MG tablet Take 1 tablet (40 mg total) by mouth daily. 04/07/18   Thurnell Lose, MD  polyethylene glycol (MIRALAX / GLYCOLAX) packet Take 17 g by mouth daily. 03/07/18   Hongalgi, Lenis Dickinson, MD  vitamin B-12 1000 MCG tablet Take 1 tablet (1,000 mcg total) by mouth daily. 03/08/18   Hongalgi, Lenis Dickinson, MD    Family History No family history on file.  Social History Social History   Tobacco Use  . Smoking status: Former Smoker    Packs/day: 0.50    Years: 50.00    Pack years: 25.00    Types: Cigarettes  . Smokeless tobacco: Never Used  Substance Use Topics  . Alcohol use: Never    Frequency: Never  . Drug use: Not Currently     Allergies   Patient has no known allergies.   Review of Systems Review of Systems  Unable to perform ROS: Acuity of condition     Physical Exam Updated Vital Signs Ht _0  (1.778 m)   Wt 40 kg   BMI 12.65 kg/m   Physical Exam Vitals signs and nursing note reviewed.  Constitutional:      Appearance: He is cachectic. He is ill-appearing.     Interventions: Face mask in place.  HENT:     Right Ear: External ear normal.     Left Ear: External ear normal.     Nose: Nose normal.     Mouth/Throat:     Mouth: Mucous membranes  are moist.  Eyes:     Conjunctiva/sclera: Conjunctivae normal.  Neck:     Musculoskeletal: No neck rigidity.  Pulmonary:     Effort: Respiratory distress present.  Abdominal:     General: There is no distension.     Palpations: Abdomen is soft.  Musculoskeletal:     Right lower leg: No edema.     Left lower leg: No edema.  Skin:    General: Skin is warm.     Capillary Refill: Capillary refill takes less than 2 seconds.  Neurological:     Mental Status: He is unresponsive.  ED Treatments / Results  Labs (all labs ordered are listed, but only abnormal results are displayed) Labs Reviewed  CULTURE, BLOOD (ROUTINE X 2)  CULTURE, BLOOD (ROUTINE X 2)  URINE CULTURE  COMPREHENSIVE METABOLIC PANEL  CBC WITH DIFFERENTIAL/PLATELET  I-STAT CG4 LACTIC ACID, ED  I-STAT CHEM 8, ED    EKG None  Radiology No results found.  Procedures Procedure Name: Intubation Date/Time:  12:58 AM Performed by: Hulan Saas, MD Pre-anesthesia Checklist: Patient being monitored, Suction available, Emergency Drugs available and Patient identified Oxygen Delivery Method: Non-rebreather mask Preoxygenation: Pre-oxygenation with 100% oxygen Induction Type: Rapid sequence Ventilation: Mask ventilation without difficulty Laryngoscope Size: 4 and Mac Grade View: Grade I Tube size: 7.0 mm Number of attempts: 1 Airway Equipment and Method: Video-laryngoscopy and Stylet Placement Confirmation: ETT inserted through vocal cords under direct vision,  Positive ETCO2,  CO2 detector and Breath sounds checked- equal and bilateral Tube secured with: ETT holder      (including critical care time)  Medications Ordered in ED Medications  lactated ringers bolus 1,000 mL (has no administration in time range)  norepinephrine (LEVOPHED) 68m in NS 2552mpremix infusion (has no administration in time range)  ceFEPIme (MAXIPIME) 2 g in sodium chloride 0.9 % 100 mL IVPB (has no administration in  time range)  metroNIDAZOLE (FLAGYL) IVPB 500 mg (has no administration in time range)  vancomycin (VANCOCIN) IVPB 1000 mg/200 mL premix (has no administration in time range)     Initial Impression / Assessment and Plan / ED Course  I have reviewed the triage vital signs and the nursing notes.  Pertinent labs & imaging results that were available during my care of the patient were reviewed by me and considered in my medical decision making (see chart for details).     Patient is a 89ear old male who presents with above-stated history exam.  On presentation patient was noted to be in PEA arrest.  CPR was immediately initiated while the respiratory therapist provided breathing support with a BVM.  Patient received 1 round of CPR and 1 mg of epinephrine before [place] was obtained.  Patient was subsequently intubated.  Please see above procedure note for further details.  IV fluids, levo fed, and calcium were given as patient reportedly had a history of recent sepsis and EMS ECG strip showed no peak T waves in the anterior leads.  Unfortunately patient became pulseless again and received another round of CPR and another milligram of epinephrine.  On recheck he was noted to be in the pulseless V. tach and received 1 defibrillation.  He then received another round of CPR and on recheck was found to be pulseless.  At this time further efforts at resuscitation were felt to be futile and the patient was declared dead at approximately 8:40 PM on 1/02-01-20 No further information was obtained from the patient during his time in the emergency department or was able to be provided by EMS.  [facility] and spoke to the on-call physician who stated the patient's primary care doctor Dr. HoLinna Darnerould complete the patient's death certificate.  Final Clinical Impressions(s) / ED Diagnoses   Final [facility]:  [facility]   ED Discharge Orders     None       SmHulan SaasMD 01Feb 01, [id number]    PiDavonna BellingMD  [id number]  SmHulan SaasMD  [id number]  PiDavonna BellingMD 05/22/18 [phone]

## 2018-06-09 DEATH — deceased

## 2018-10-15 IMAGING — US US ABDOMEN LIMITED
1 series · 13 of 25 positions shown · non-contrast
Comparison: None.

CLINICAL DATA: Abnormal LFTs.

EXAM:
ULTRASOUND ABDOMEN LIMITED RIGHT UPPER QUADRANT

[Series 1: us abdomen limited · 0.17mm/px · 13 of 82 slices shown]
[im 1/82]
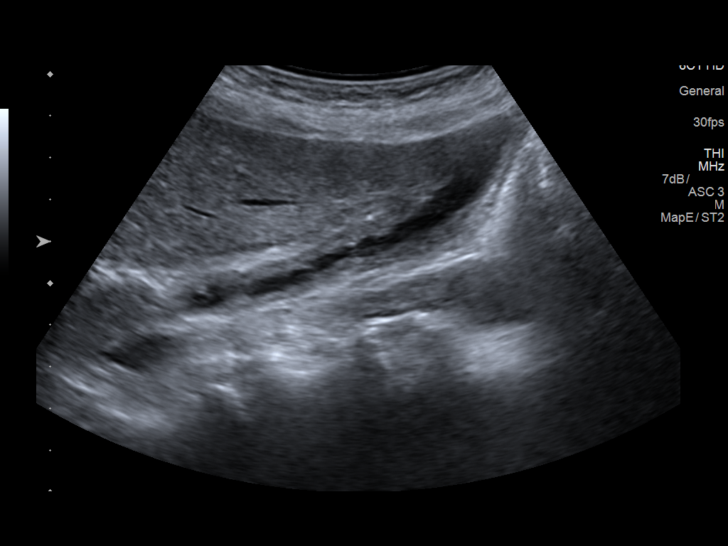
[im 7/82]
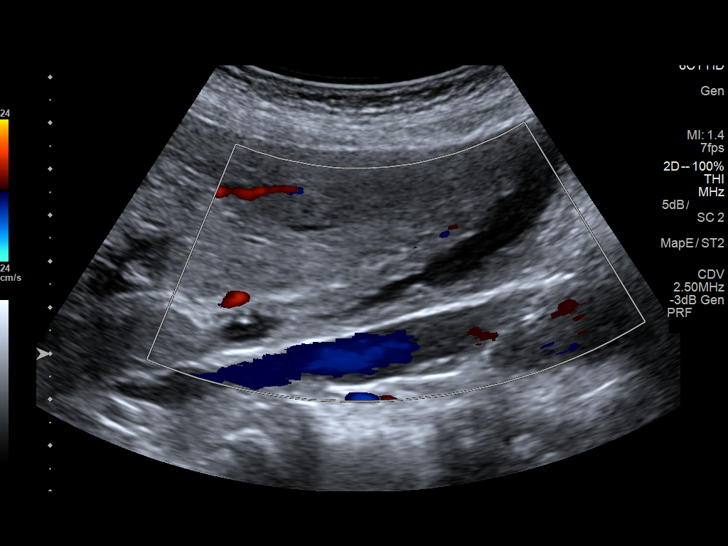
[im 14/82]
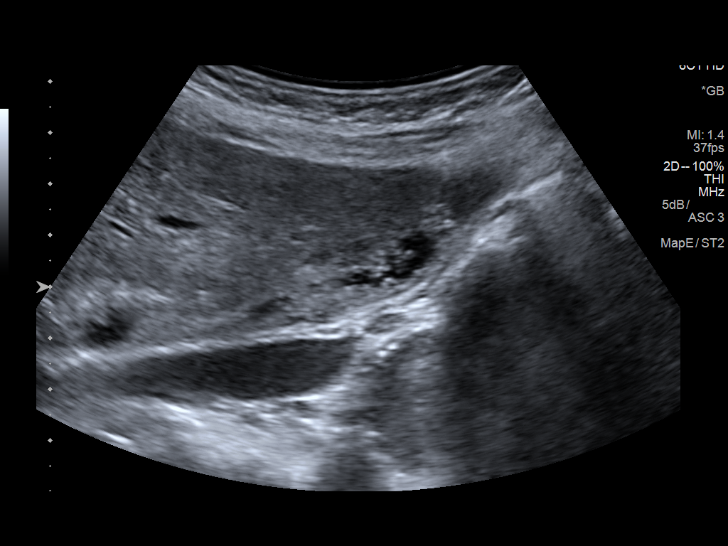
[im 21/82]
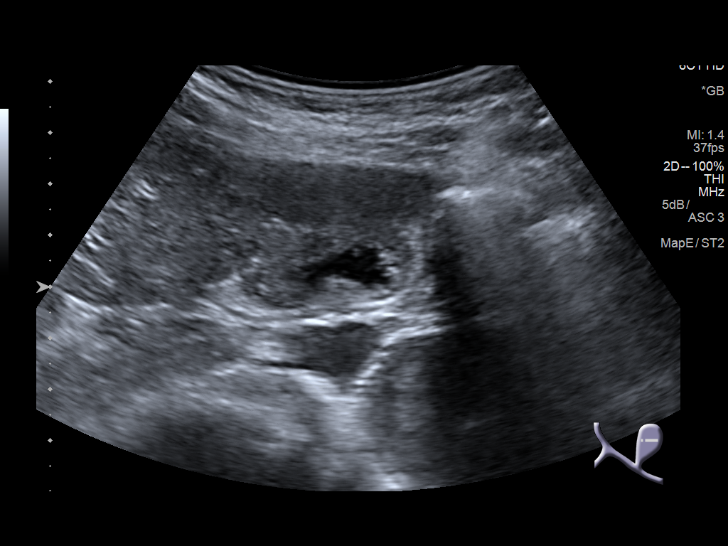
[im 28/82]
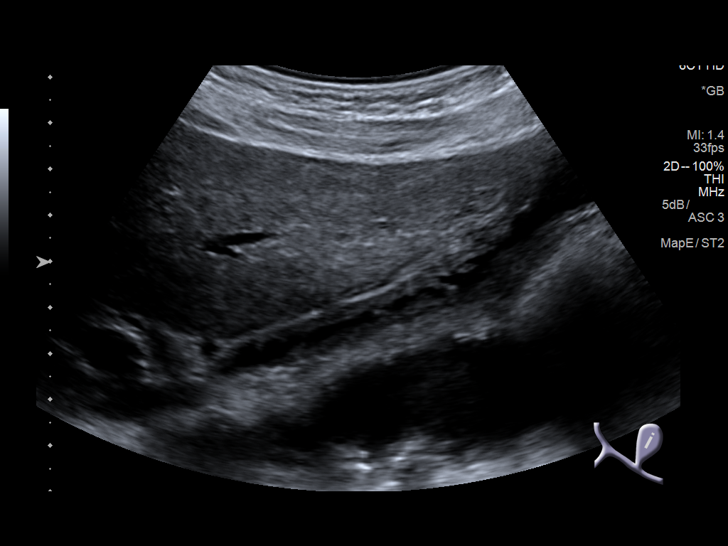
[im 34/82]
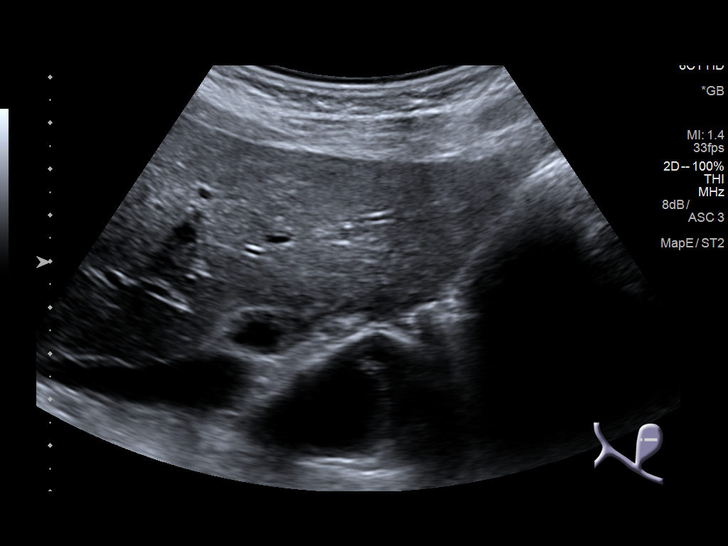
[im 41/82]
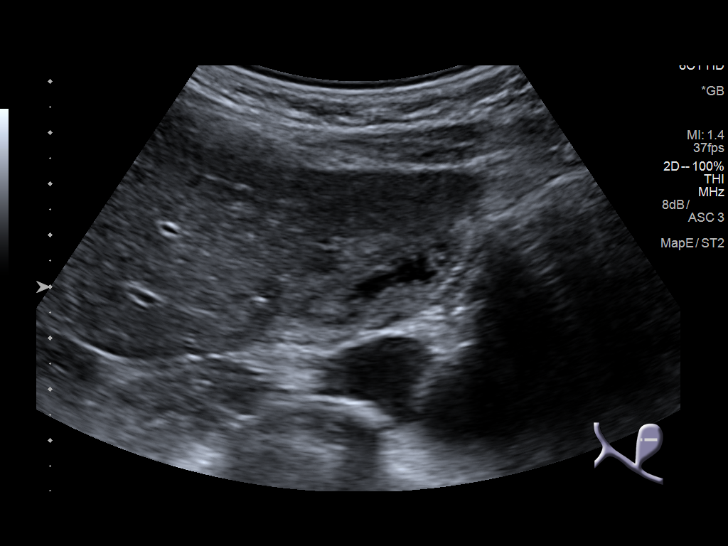
[im 48/82]
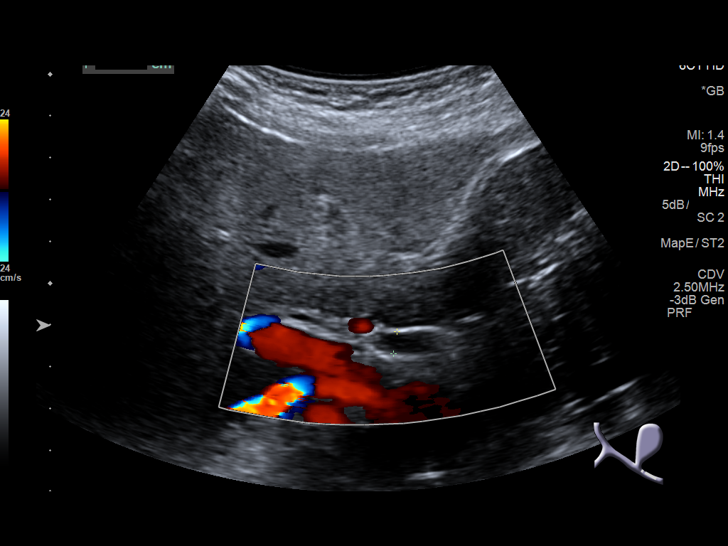
[im 55/82]
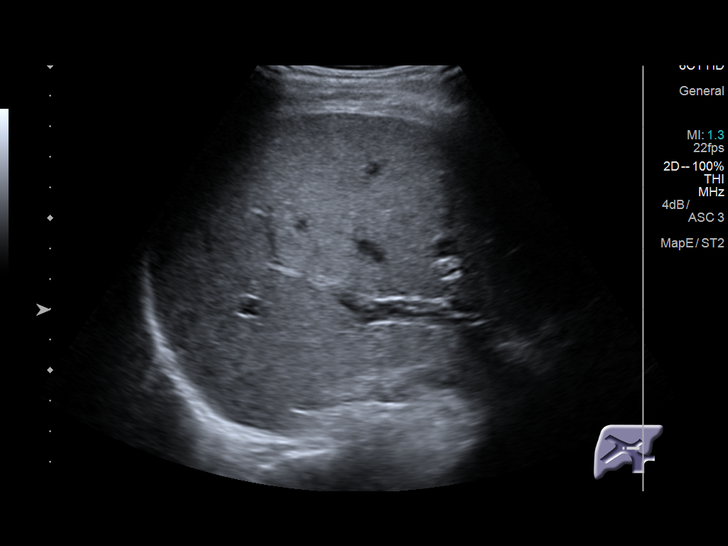
[im 61/82]
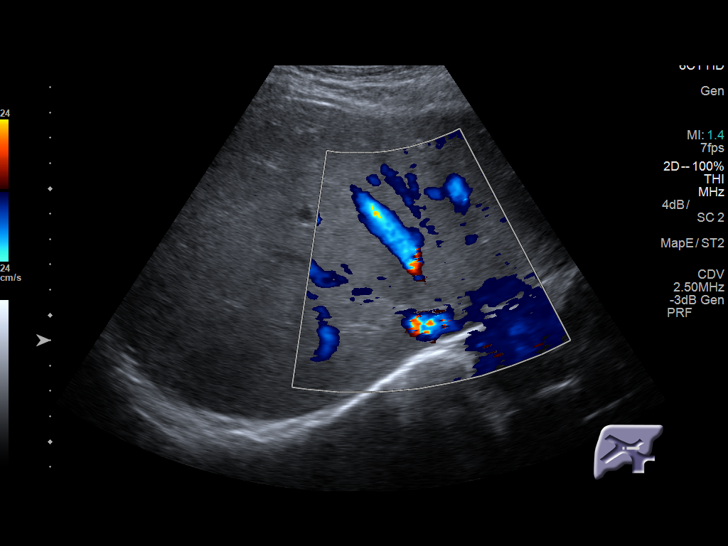
[im 68/82]
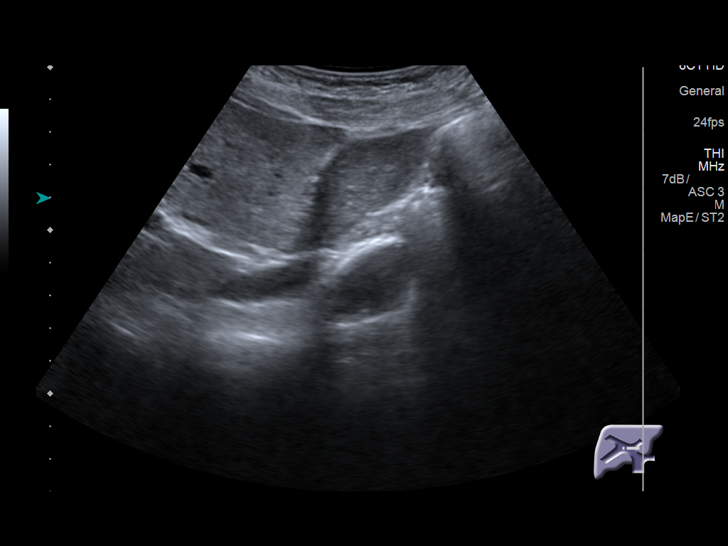
[im 75/82]
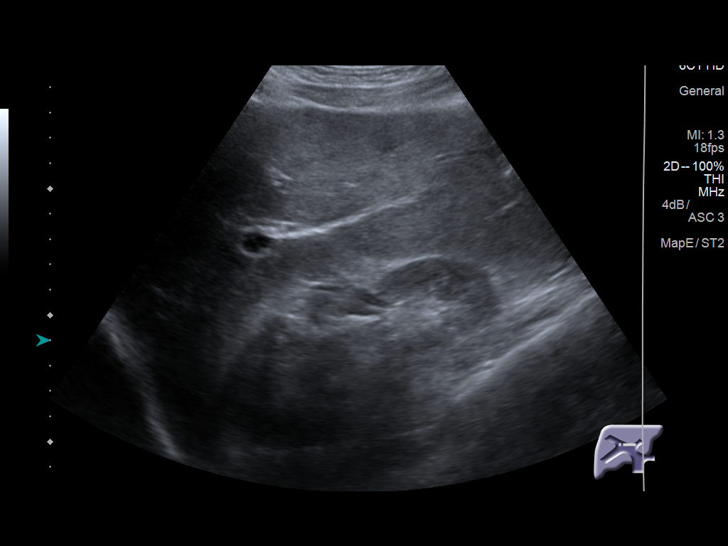
[im 82/82]
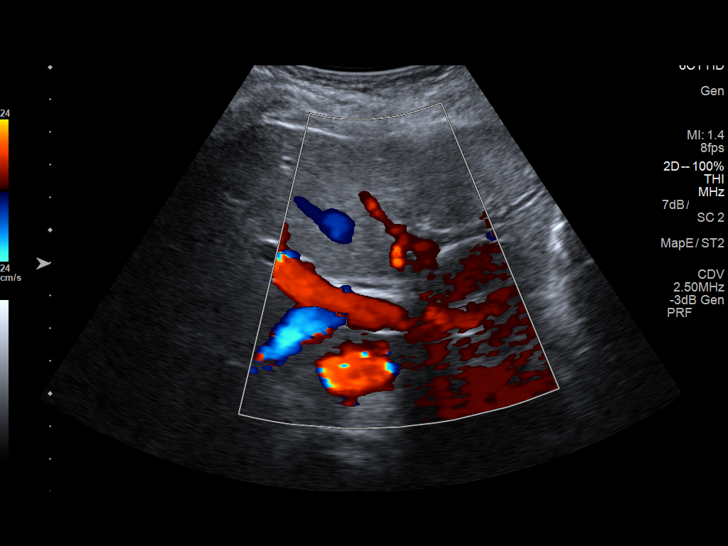

[13 of 25 positions shown; findings below may reference images not displayed]

FINDINGS: Gallbladder:

Gallbladder is contracted. Gallbladder walls appear thickened, but
this is likely accentuated by the gallbladder contraction. Small
stones versus polyps at the gallbladder fundus.

Common bile duct:

Diameter: Within normal limits of 5-6 mm.

Liver:

No focal lesion identified. Within normal limits in parenchymal
echogenicity. Portal vein is patent on color Doppler imaging with
normal direction of blood flow towards the liver.
IMPRESSION: 1. Gallbladder is contracted, despite being NPO for 6 hours. This
may indicate gallbladder dysfunction. Gallbladder walls are
thickened, but this is likely accentuated by the gallbladder
contraction. No pericholecystic fluid, sonographic Murphy's sign or
other secondary signs of acute cholecystitis. Gallbladder wall
thickening is nonspecific with differential considerations of acute
or chronic cholecystitis, hypoproteinemia, CHF and reactive
thickening secondary to adjacent liver disease. Would consider
nuclear medicine HIDA scan to ensure normal gallbladder function and
exclude cholecystitis.
2. Small polyps versus small stones at the gallbladder fundus.
3. Liver appears normal.
4. No bile duct dilatation.

## 2019-09-30 IMAGING — DX DG TIBIA/FIBULA 2V*L*
4 series · 4 of 4 positions shown · non-contrast
Comparison: None.

CLINICAL DATA: Necrosis, known ischemia

EXAM:
LEFT TIBIA AND FIBULA - 2 VIEW

[tibia ap (1 of 2)]
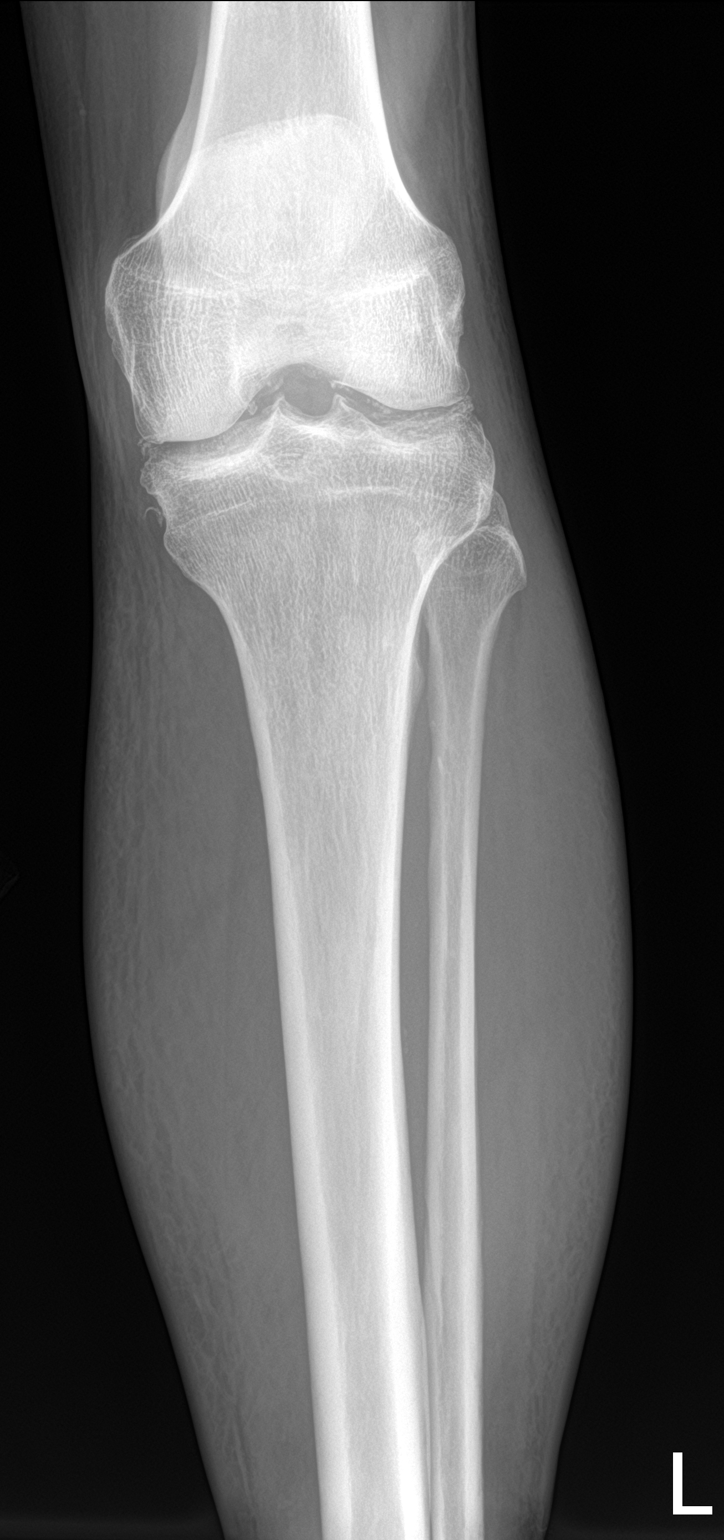

[tibia ap (2 of 2)]
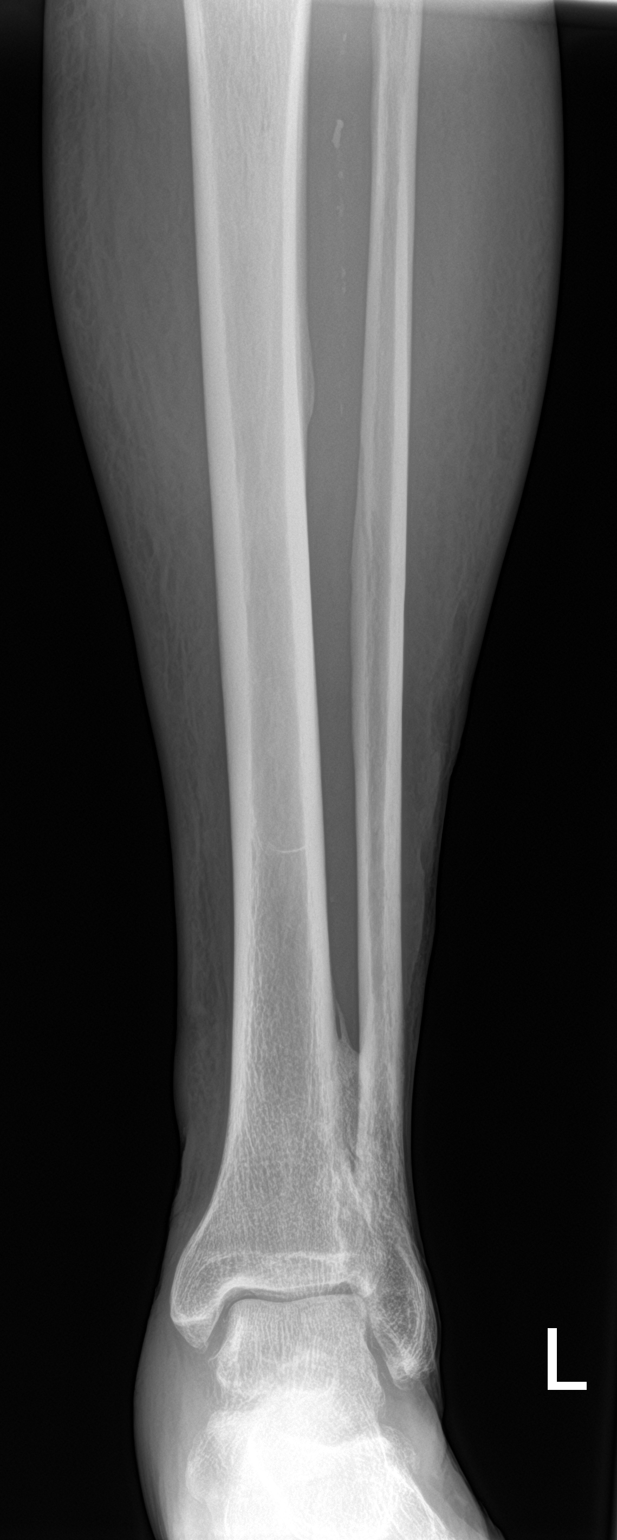

[tibia lat (1 of 2)]
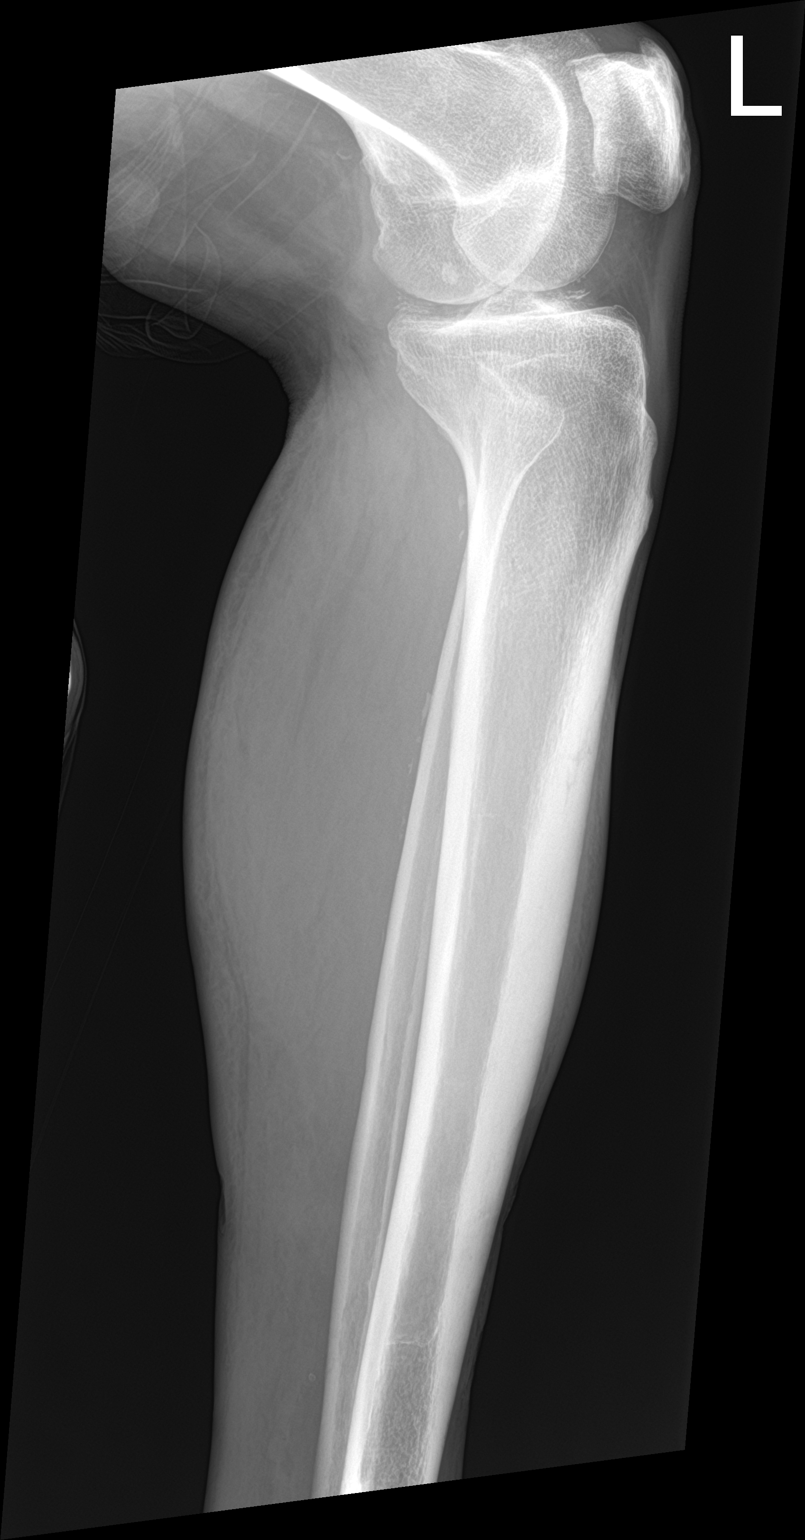

[tibia lat (2 of 2)]
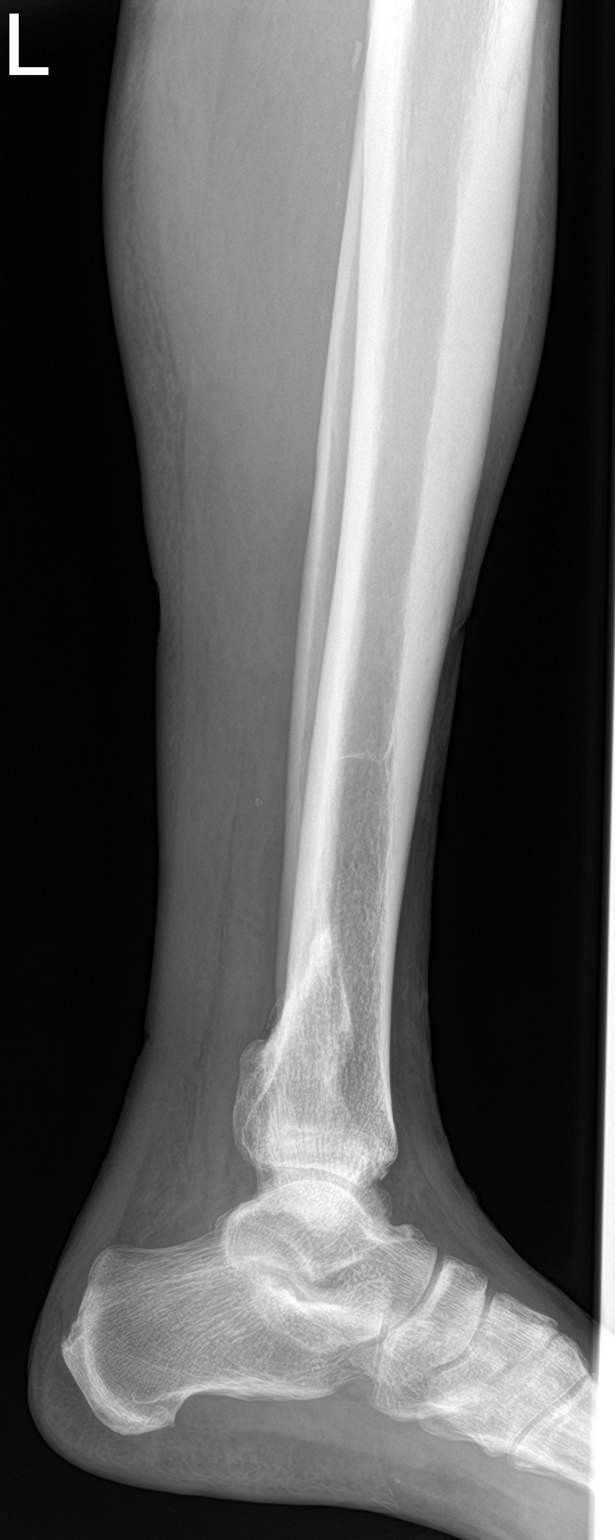

[4 of 4 positions shown; findings below may reference images not displayed]

FINDINGS: Degenerative changes in the left knee with joint space narrowing and
spurring. No acute bony abnormality. Specifically, no fracture,
subluxation, or dislocation. No radiographic changes of
osteomyelitis. Soft tissues appear intact.
IMPRESSION: No acute bony abnormality. No radiographic changes of osteomyelitis.
